# Patient Record
Sex: Female | Born: 1960
Health system: Southern US, Community
[De-identification: ages and names within clinical notes are randomized; demographics above are authoritative.]

## PROBLEM LIST (undated history)

## (undated) DIAGNOSIS — I10 Essential (primary) hypertension: Secondary | ICD-10-CM

## (undated) DIAGNOSIS — H547 Unspecified visual loss: Secondary | ICD-10-CM

## (undated) DIAGNOSIS — E785 Hyperlipidemia, unspecified: Secondary | ICD-10-CM

## (undated) DIAGNOSIS — Q909 Down syndrome, unspecified: Secondary | ICD-10-CM

## (undated) HISTORY — DX: Unspecified visual loss: H54.7

## (undated) HISTORY — DX: Down syndrome, unspecified: Q90.9

## (undated) HISTORY — DX: Hyperlipidemia, unspecified: E78.5

---

## 1998-04-11 ENCOUNTER — Encounter: Admission: RE | Admit: 1998-04-11 | Discharge: 1998-04-11 | Payer: Self-pay | Admitting: Internal Medicine

## 1999-01-22 ENCOUNTER — Encounter: Admission: RE | Admit: 1999-01-22 | Discharge: 1999-01-22 | Payer: Self-pay | Admitting: Internal Medicine

## 1999-04-13 ENCOUNTER — Inpatient Hospital Stay (HOSPITAL_COMMUNITY): Admission: EM | Admit: 1999-04-13 | Discharge: 1999-04-15 | Payer: Self-pay | Admitting: Emergency Medicine

## 1999-05-30 ENCOUNTER — Encounter: Admission: RE | Admit: 1999-05-30 | Discharge: 1999-05-30 | Payer: Self-pay | Admitting: Internal Medicine

## 1999-07-10 ENCOUNTER — Emergency Department (HOSPITAL_COMMUNITY): Admission: EM | Admit: 1999-07-10 | Discharge: 1999-07-10 | Payer: Self-pay | Admitting: Emergency Medicine

## 1999-09-02 ENCOUNTER — Emergency Department (HOSPITAL_COMMUNITY): Admission: EM | Admit: 1999-09-02 | Discharge: 1999-09-02 | Payer: Self-pay | Admitting: Emergency Medicine

## 1999-09-02 ENCOUNTER — Encounter: Payer: Self-pay | Admitting: *Deleted

## 1999-11-11 ENCOUNTER — Encounter: Admission: RE | Admit: 1999-11-11 | Discharge: 1999-11-11 | Payer: Self-pay | Admitting: Internal Medicine

## 1999-12-03 ENCOUNTER — Encounter: Admission: RE | Admit: 1999-12-03 | Discharge: 2000-03-02 | Payer: Self-pay | Admitting: Internal Medicine

## 2000-03-02 ENCOUNTER — Encounter: Admission: RE | Admit: 2000-03-02 | Discharge: 2000-05-31 | Payer: Self-pay | Admitting: Internal Medicine

## 2000-03-18 ENCOUNTER — Encounter: Admission: RE | Admit: 2000-03-18 | Discharge: 2000-03-18 | Payer: Self-pay | Admitting: Hematology and Oncology

## 2000-08-13 ENCOUNTER — Encounter: Admission: RE | Admit: 2000-08-13 | Discharge: 2000-08-13 | Payer: Self-pay | Admitting: Internal Medicine

## 2000-10-18 ENCOUNTER — Ambulatory Visit (HOSPITAL_COMMUNITY): Admission: RE | Admit: 2000-10-18 | Discharge: 2000-10-18 | Payer: Self-pay | Admitting: Internal Medicine

## 2000-10-18 ENCOUNTER — Encounter: Admission: RE | Admit: 2000-10-18 | Discharge: 2000-10-18 | Payer: Self-pay | Admitting: Internal Medicine

## 2000-11-01 ENCOUNTER — Ambulatory Visit (HOSPITAL_COMMUNITY): Admission: RE | Admit: 2000-11-01 | Discharge: 2000-11-01 | Payer: Self-pay | Admitting: Internal Medicine

## 2000-11-01 ENCOUNTER — Encounter: Admission: RE | Admit: 2000-11-01 | Discharge: 2000-11-01 | Payer: Self-pay | Admitting: Internal Medicine

## 2000-11-15 ENCOUNTER — Emergency Department (HOSPITAL_COMMUNITY): Admission: EM | Admit: 2000-11-15 | Discharge: 2000-11-15 | Payer: Self-pay | Admitting: Emergency Medicine

## 2000-12-22 ENCOUNTER — Encounter: Admission: RE | Admit: 2000-12-22 | Discharge: 2001-01-03 | Payer: Self-pay | Admitting: Surgery

## 2000-12-30 ENCOUNTER — Emergency Department (HOSPITAL_COMMUNITY): Admission: EM | Admit: 2000-12-30 | Discharge: 2000-12-30 | Payer: Self-pay | Admitting: Emergency Medicine

## 2001-03-21 ENCOUNTER — Encounter: Admission: RE | Admit: 2001-03-21 | Discharge: 2001-03-21 | Payer: Self-pay | Admitting: Internal Medicine

## 2001-04-06 ENCOUNTER — Encounter: Admission: RE | Admit: 2001-04-06 | Discharge: 2001-07-05 | Payer: Self-pay | Admitting: Internal Medicine

## 2001-07-12 ENCOUNTER — Encounter: Admission: RE | Admit: 2001-07-12 | Discharge: 2001-10-10 | Payer: Self-pay | Admitting: Orthopedic Surgery

## 2001-08-08 ENCOUNTER — Encounter: Admission: RE | Admit: 2001-08-08 | Discharge: 2001-08-08 | Payer: Self-pay | Admitting: Internal Medicine

## 2001-10-11 ENCOUNTER — Encounter: Admission: RE | Admit: 2001-10-11 | Discharge: 2002-01-09 | Payer: Self-pay | Admitting: Orthopedic Surgery

## 2001-12-15 ENCOUNTER — Encounter: Admission: RE | Admit: 2001-12-15 | Discharge: 2001-12-15 | Payer: Self-pay

## 2002-03-30 ENCOUNTER — Encounter: Admission: RE | Admit: 2002-03-30 | Discharge: 2002-03-30 | Payer: Self-pay | Admitting: Internal Medicine

## 2002-04-26 ENCOUNTER — Encounter: Payer: Self-pay | Admitting: Emergency Medicine

## 2002-04-26 ENCOUNTER — Inpatient Hospital Stay (HOSPITAL_COMMUNITY): Admission: EM | Admit: 2002-04-26 | Discharge: 2002-04-29 | Payer: Self-pay | Admitting: Emergency Medicine

## 2002-07-31 ENCOUNTER — Encounter (HOSPITAL_BASED_OUTPATIENT_CLINIC_OR_DEPARTMENT_OTHER): Admission: RE | Admit: 2002-07-31 | Discharge: 2002-10-29 | Payer: Self-pay | Admitting: Internal Medicine

## 2002-12-04 ENCOUNTER — Encounter (HOSPITAL_BASED_OUTPATIENT_CLINIC_OR_DEPARTMENT_OTHER): Admission: RE | Admit: 2002-12-04 | Discharge: 2003-03-04 | Payer: Self-pay | Admitting: Internal Medicine

## 2003-01-23 ENCOUNTER — Encounter: Admission: RE | Admit: 2003-01-23 | Discharge: 2003-01-23 | Payer: Self-pay | Admitting: Internal Medicine

## 2003-04-15 ENCOUNTER — Emergency Department (HOSPITAL_COMMUNITY): Admission: EM | Admit: 2003-04-15 | Discharge: 2003-04-15 | Payer: Self-pay | Admitting: Emergency Medicine

## 2003-05-03 ENCOUNTER — Encounter (HOSPITAL_BASED_OUTPATIENT_CLINIC_OR_DEPARTMENT_OTHER): Admission: RE | Admit: 2003-05-03 | Discharge: 2003-08-01 | Payer: Self-pay | Admitting: Internal Medicine

## 2003-10-01 ENCOUNTER — Emergency Department (HOSPITAL_COMMUNITY): Admission: EM | Admit: 2003-10-01 | Discharge: 2003-10-01 | Payer: Self-pay | Admitting: Emergency Medicine

## 2003-12-11 ENCOUNTER — Encounter: Admission: RE | Admit: 2003-12-11 | Discharge: 2003-12-11 | Payer: Self-pay | Admitting: Internal Medicine

## 2004-01-11 ENCOUNTER — Encounter: Admission: RE | Admit: 2004-01-11 | Discharge: 2004-01-11 | Payer: Self-pay | Admitting: Internal Medicine

## 2004-01-15 ENCOUNTER — Encounter: Admission: RE | Admit: 2004-01-15 | Discharge: 2004-01-15 | Payer: Self-pay | Admitting: Internal Medicine

## 2004-01-18 ENCOUNTER — Encounter: Admission: RE | Admit: 2004-01-18 | Discharge: 2004-01-18 | Payer: Self-pay | Admitting: Internal Medicine

## 2004-11-06 ENCOUNTER — Encounter (HOSPITAL_BASED_OUTPATIENT_CLINIC_OR_DEPARTMENT_OTHER): Admission: RE | Admit: 2004-11-06 | Discharge: 2005-01-21 | Payer: Self-pay | Admitting: Internal Medicine

## 2005-03-19 ENCOUNTER — Ambulatory Visit: Payer: Self-pay | Admitting: Internal Medicine

## 2005-04-23 ENCOUNTER — Ambulatory Visit: Payer: Self-pay | Admitting: Internal Medicine

## 2005-05-13 ENCOUNTER — Ambulatory Visit: Payer: Self-pay | Admitting: Internal Medicine

## 2005-11-16 HISTORY — PX: EYE SURGERY: SHX253

## 2006-02-25 ENCOUNTER — Ambulatory Visit: Payer: Self-pay

## 2006-06-10 ENCOUNTER — Ambulatory Visit: Payer: Self-pay | Admitting: Internal Medicine

## 2006-06-18 ENCOUNTER — Ambulatory Visit: Payer: Self-pay | Admitting: Hospitalist

## 2006-06-24 ENCOUNTER — Ambulatory Visit: Payer: Self-pay | Admitting: Internal Medicine

## 2006-09-25 DIAGNOSIS — I10 Essential (primary) hypertension: Secondary | ICD-10-CM | POA: Insufficient documentation

## 2006-09-25 DIAGNOSIS — Q909 Down syndrome, unspecified: Secondary | ICD-10-CM

## 2006-09-25 DIAGNOSIS — E1169 Type 2 diabetes mellitus with other specified complication: Secondary | ICD-10-CM | POA: Insufficient documentation

## 2007-01-27 ENCOUNTER — Ambulatory Visit: Payer: Self-pay | Admitting: *Deleted

## 2007-01-27 DIAGNOSIS — E785 Hyperlipidemia, unspecified: Secondary | ICD-10-CM

## 2007-01-27 DIAGNOSIS — L84 Corns and callosities: Secondary | ICD-10-CM | POA: Insufficient documentation

## 2007-01-27 LAB — CONVERTED CEMR LAB: Hgb A1c MFr Bld: 6.1 %

## 2007-02-10 ENCOUNTER — Emergency Department (HOSPITAL_COMMUNITY): Admission: EM | Admit: 2007-02-10 | Discharge: 2007-02-10 | Payer: Self-pay | Admitting: Family Medicine

## 2007-03-31 ENCOUNTER — Encounter (INDEPENDENT_AMBULATORY_CARE_PROVIDER_SITE_OTHER): Payer: Self-pay | Admitting: Internal Medicine

## 2007-04-05 ENCOUNTER — Telehealth (INDEPENDENT_AMBULATORY_CARE_PROVIDER_SITE_OTHER): Payer: Self-pay | Admitting: *Deleted

## 2007-08-26 ENCOUNTER — Emergency Department (HOSPITAL_COMMUNITY): Admission: EM | Admit: 2007-08-26 | Discharge: 2007-08-26 | Payer: Self-pay | Admitting: Emergency Medicine

## 2007-08-31 ENCOUNTER — Ambulatory Visit: Payer: Self-pay | Admitting: Internal Medicine

## 2007-09-01 ENCOUNTER — Encounter (INDEPENDENT_AMBULATORY_CARE_PROVIDER_SITE_OTHER): Payer: Self-pay | Admitting: Internal Medicine

## 2007-09-07 ENCOUNTER — Ambulatory Visit: Payer: Self-pay | Admitting: Infectious Diseases

## 2007-09-07 ENCOUNTER — Encounter (INDEPENDENT_AMBULATORY_CARE_PROVIDER_SITE_OTHER): Payer: Self-pay | Admitting: Internal Medicine

## 2007-09-07 ENCOUNTER — Telehealth: Payer: Self-pay | Admitting: *Deleted

## 2007-09-09 LAB — CONVERTED CEMR LAB
ALT: 28 units/L (ref 0–35)
Albumin: 3.6 g/dL (ref 3.5–5.2)
Basophils Absolute: 0.1 10*3/uL (ref 0.0–0.1)
Basophils Relative: 1 % (ref 0–1)
Calcium: 8.8 mg/dL (ref 8.4–10.5)
Creatinine, Ser: 1.02 mg/dL (ref 0.40–1.20)
Eosinophils Relative: 1 % (ref 0–5)
HCT: 38.5 % (ref 36.0–46.0)
LDL Cholesterol: 99 mg/dL (ref 0–99)
Lymphs Abs: 4 10*3/uL — ABNORMAL HIGH (ref 0.7–3.3)
MCHC: 30.9 g/dL (ref 30.0–36.0)
MCV: 90.6 fL (ref 78.0–100.0)
Neutro Abs: 4.8 10*3/uL (ref 1.7–7.7)
Neutrophils Relative %: 49 % (ref 43–77)
Platelets: 272 10*3/uL (ref 150–400)
RBC: 4.25 M/uL (ref 3.87–5.11)
Sodium: 138 meq/L (ref 135–145)
Total CHOL/HDL Ratio: 3.3
Total Protein: 7.8 g/dL (ref 6.0–8.3)
VLDL: 12 mg/dL (ref 0–40)

## 2007-09-22 ENCOUNTER — Encounter (INDEPENDENT_AMBULATORY_CARE_PROVIDER_SITE_OTHER): Payer: Self-pay | Admitting: Internal Medicine

## 2008-04-11 ENCOUNTER — Encounter: Payer: Self-pay | Admitting: Internal Medicine

## 2008-05-08 ENCOUNTER — Encounter (INDEPENDENT_AMBULATORY_CARE_PROVIDER_SITE_OTHER): Payer: Self-pay | Admitting: Internal Medicine

## 2008-06-27 ENCOUNTER — Ambulatory Visit: Payer: Self-pay | Admitting: Infectious Diseases

## 2008-06-27 LAB — CONVERTED CEMR LAB
Blood Glucose, Fingerstick: 135
Hgb A1c MFr Bld: 6.3 %

## 2008-07-06 ENCOUNTER — Encounter: Payer: Self-pay | Admitting: Internal Medicine

## 2008-11-12 ENCOUNTER — Emergency Department (HOSPITAL_COMMUNITY): Admission: EM | Admit: 2008-11-12 | Discharge: 2008-11-12 | Payer: Self-pay | Admitting: Family Medicine

## 2008-11-12 ENCOUNTER — Telehealth: Payer: Self-pay | Admitting: Infectious Diseases

## 2008-12-13 ENCOUNTER — Ambulatory Visit: Payer: Self-pay | Admitting: Internal Medicine

## 2008-12-13 ENCOUNTER — Encounter: Payer: Self-pay | Admitting: Internal Medicine

## 2008-12-13 ENCOUNTER — Telehealth: Payer: Self-pay | Admitting: Internal Medicine

## 2008-12-13 LAB — CONVERTED CEMR LAB
ALT: 28 units/L (ref 0–35)
AST: 25 units/L (ref 0–37)
BUN: 29 mg/dL — ABNORMAL HIGH (ref 6–23)
Calcium: 9.1 mg/dL (ref 8.4–10.5)
Chloride: 103 meq/L (ref 96–112)
Cholesterol: 195 mg/dL (ref 0–200)
Glucose, Bld: 136 mg/dL — ABNORMAL HIGH (ref 70–99)
HCT: 42.1 % (ref 36.0–46.0)
HDL: 74 mg/dL (ref >39)
Hemoglobin: 13.2 g/dL (ref 12.0–15.0)
MCV: 90.1 fL (ref 78.0–100.0)
Microalb Creat Ratio: 2.8 mg/g (ref 0.0–30.0)
Monocytes Relative: 8 % (ref 3–12)
Neutrophils Relative %: 45 % (ref 43–77)
Platelets: 222 10*3/uL (ref 150–400)
RBC: 4.67 M/uL (ref 3.87–5.11)
Total Bilirubin: 0.4 mg/dL (ref 0.3–1.2)
Total Protein: 7.4 g/dL (ref 6.0–8.3)
Triglycerides: 87 mg/dL (ref <150)
WBC: 6.8 10*3/uL (ref 4.0–10.5)

## 2008-12-24 ENCOUNTER — Ambulatory Visit: Payer: Self-pay | Admitting: Internal Medicine

## 2008-12-24 LAB — CONVERTED CEMR LAB: Blood Glucose, Fingerstick: 167

## 2009-02-08 ENCOUNTER — Telehealth (INDEPENDENT_AMBULATORY_CARE_PROVIDER_SITE_OTHER): Payer: Self-pay | Admitting: *Deleted

## 2009-03-12 ENCOUNTER — Telehealth: Payer: Self-pay | Admitting: Internal Medicine

## 2009-03-18 ENCOUNTER — Encounter: Payer: Self-pay | Admitting: Internal Medicine

## 2009-04-05 ENCOUNTER — Encounter: Payer: Self-pay | Admitting: Internal Medicine

## 2009-04-12 ENCOUNTER — Ambulatory Visit: Payer: Self-pay | Admitting: *Deleted

## 2009-04-12 ENCOUNTER — Encounter: Payer: Self-pay | Admitting: Internal Medicine

## 2009-04-12 LAB — CONVERTED CEMR LAB: Blood Glucose, Fingerstick: 125

## 2009-04-16 ENCOUNTER — Ambulatory Visit: Payer: Self-pay | Admitting: Internal Medicine

## 2009-05-01 ENCOUNTER — Encounter: Payer: Self-pay | Admitting: Internal Medicine

## 2009-05-15 ENCOUNTER — Encounter: Payer: Self-pay | Admitting: Internal Medicine

## 2009-06-11 ENCOUNTER — Ambulatory Visit: Payer: Self-pay | Admitting: Internal Medicine

## 2009-07-10 ENCOUNTER — Telehealth: Payer: Self-pay | Admitting: Internal Medicine

## 2010-03-11 ENCOUNTER — Telehealth: Payer: Self-pay | Admitting: Internal Medicine

## 2010-03-17 ENCOUNTER — Ambulatory Visit: Payer: Self-pay | Admitting: Internal Medicine

## 2010-03-17 LAB — CONVERTED CEMR LAB: Hgb A1c MFr Bld: 6.3 %

## 2010-05-13 ENCOUNTER — Encounter: Payer: Self-pay | Admitting: Internal Medicine

## 2010-08-06 ENCOUNTER — Ambulatory Visit: Payer: Self-pay | Admitting: Internal Medicine

## 2010-08-06 DIAGNOSIS — M109 Gout, unspecified: Secondary | ICD-10-CM | POA: Insufficient documentation

## 2010-08-06 DIAGNOSIS — B86 Scabies: Secondary | ICD-10-CM

## 2010-08-06 LAB — CONVERTED CEMR LAB: Blood Glucose, Fingerstick: 95

## 2010-08-28 ENCOUNTER — Encounter: Admission: RE | Admit: 2010-08-28 | Discharge: 2010-09-24 | Payer: Self-pay | Admitting: Podiatry

## 2010-10-27 ENCOUNTER — Ambulatory Visit: Payer: Self-pay | Admitting: Internal Medicine

## 2010-10-27 ENCOUNTER — Encounter: Payer: Self-pay | Admitting: Internal Medicine

## 2010-10-27 LAB — CONVERTED CEMR LAB
ALT: 14 units/L (ref 0–35)
Creatinine, Ser: 1.01 mg/dL (ref 0.40–1.20)
Creatinine, Urine: 123 mg/dL
HDL: 53 mg/dL (ref >39)
Hgb A1c MFr Bld: 6.4 %
Indirect Bilirubin: 0.5 mg/dL (ref 0.0–0.9)
Microalb, Ur: 0.75 mg/dL (ref 0.00–1.89)
Potassium: 4.2 meq/L (ref 3.5–5.3)
Sodium: 140 meq/L (ref 135–145)
Triglycerides: 75 mg/dL (ref <150)
VLDL: 15 mg/dL (ref 0–40)

## 2010-12-07 ENCOUNTER — Encounter: Payer: Self-pay | Admitting: Infectious Diseases

## 2010-12-08 ENCOUNTER — Encounter: Payer: Self-pay | Admitting: Internal Medicine

## 2010-12-18 NOTE — Letter (Signed)
Summary: [employer]  [employer]   Imported By: Margie Billet 07/17/2010 09:56:55  _____________________________________________________________________  External Attachment:    Type:   Image     Comment:   External Document

## 2010-12-18 NOTE — Letter (Signed)
Summary: [employer]  [employer]   Imported By: Shon Hough 10/30/2010 15:34:25  _____________________________________________________________________  External Attachment:    Type:   Image     Comment:   External Document

## 2010-12-18 NOTE — Progress Notes (Signed)
Summary: refill/gg  Phone Note Refill Request  on March 11, 2010 9:45 AM  Refills Requested: Medication #1:  HYDROCHLOROTHIAZIDE 25 MG  TABS Take 1 tablet by mouth once a day   Last Refilled: 01/17/2010  Method Requested: Electronic Initial call taken by: Merrie Roof RN,  March 11, 2010 9:45 AM  Follow-up for Phone Call        Refill approved-nurse to complete Follow-up by: Julaine Fusi  DO,  March 11, 2010 2:13 PM    Prescriptions: HYDROCHLOROTHIAZIDE 25 MG  TABS (HYDROCHLOROTHIAZIDE) Take 1 tablet by mouth once a day  #30 Tablet x 9   Entered and Authorized by:   Julaine Fusi  DO   Signed by:   Julaine Fusi  DO on 03/11/2010   Method used:   [facility]        [facility] [phone]* (retail)       [address]       [place], [place]  [address]       Ph: [phone] or [phone]       Fax: [phone]   RxID:   [id number]

## 2010-12-18 NOTE — Assessment & Plan Note (Signed)
Summary: acute-diabetes check and requesting diabetic shoes unable to ...   Vital Signs:  Patient profile:   50 year old female Height:      48.9 inches (124.21 cm) Weight:      158.6 pounds (72.09 kg) BMI:     46.80 Temp:     97.0 degrees F oral Pulse rate:   58 / minute BP sitting:   117 / 71  (right arm) Cuff size:   regular  Vitals Entered By: Chinita Pester RN (October 27, 2010 11:00 AM) CC: Check-up. Needs diabetic shoes. Is Patient Diabetic? Yes Did you bring your meter with you today? No Pain Assessment Patient in pain? yes     Location: left foot Intensity: unable Type: "gout" pain Onset of pain  Intermittent Nutritional Status BMI of > 30 = obese CBG Result 184  Have you ever been in a relationship where you felt threatened, hurt or afraid?Unable to ask; someone w/pt.   Does patient need assistance? Functional Status Self care, Cook/clean, Shopping, Social activities Ambulation Impaired:Risk for fall Comments Nurse w/pt.     Legally blind.   Primary Care Provider:  Julaine Fusi  DO  CC:  Check-up. Needs diabetic shoes..  History of Present Illness: DM. patient requests a RX for diabetic shoes. HTN. Takes her meds as prescribed. No side-effects. HLD. Need FLP. Gout. Feels worse. Left foot gout attacks 1-2xmonth. Diet high in red meets. Scabies. Used perimeth lotion as Rx-ed.Denies any itching.  Depression History:      The patient denies a depressed mood most of the day and a diminished interest in her usual daily activities.          Diabetic Foot Exam Foot Inspection Is there a history of a foot ulcer?              No Is there a foot ulcer now?              No Is there swelling or an abnormal foot shape?          No Are the toenails long?                No Are the toenails thick?                No Are the toenails ingrown?              No Is there heavy callous build-up?              No Is there pain in the calf muscle (Intermittent  claudication) when walking?    NoIs there a claw toe deformity?              No Is there elevated skin temperature?            No Is there limited ankle dorsiflexion?            No Is there foot or ankle muscle weakness?            No  Diabetic Foot Care Education Patient educated on appropriate care of diabetic feet.  Pulse Check          Right Foot          Left Foot Posterior Tibial:        normal            normal Dorsalis Pedis:        normal            normal  High Risk Feet? No   10-g (5.07) Semmes-Weinstein Monofilament Test           Right Foot          Left Foot Visual Inspection               Test Control      normal         normal Site 1         normal         normal Site 2         normal         normal Site 3         normal         normal Site 4         normal         normal Site 5         normal         normal Site 6         normal         normal Site 7         normal         normal Site 8         normal         normal Site 9         normal         normal Site 10         normal         normal  Impression      normal         normal  Preventive Screening-Counseling & Management  Alcohol-Tobacco     Smoking Status: never  Caffeine-Diet-Exercise     Does Patient Exercise: no  Problems Prior to Update: 1)  Diabetes Mellitus, Type II  (ICD-250.00) 2)  Hypertension  (ICD-401.9) 3)  Neck Pain  (ICD-723.1) 4)  Foot Pain, Right  (ICD-729.5) 5)  Foot Pain, Left  (ICD-729.5) 6)  Preventive Health Care  (ICD-V70.0) 7)  Hyperlipidemia  (ICD-272.4) 8)  Glaucoma, Left Eye  (ICD-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 Inject 20 Units Subcutaneously Every Morning and 10 Units At Night 2)  Hydrochlorothiazide 25 Mg  Tabs (Hydrochlorothiazide) .... Take 1 Tablet By Mouth Once A Day 3)  Freestyle Lite   Strp (Glucose Blood) .... To Test Blood Sugar Four Times A Day Before Meals and Bedtime 4)  Lancets   Misc (Lancets) .... To Test Blood Sugar Four Times A Day 5)  Bd Insulin Syringe 31g X 5/16" 0.3 Ml Misc (Insulin Syringe-Needle U-100) .... Use To Inject Insulin Twice Daily 6)  Freestyle Lite  Devi (Blood Glucose Monitoring Suppl) .Marland Kitchen.. 1 Meter Dx: 250.0 Use As Directed 7)  Voltaren 1 % Gel (Diclofenac Sodium) .... Apply Topically To Back of Neck and Shoulders Two Times A Day As Directed 8)  Colcrys 0.6 Mg Tabs (Colchicine) .... Take Two Tablets By  Mouth Now, Then One Tablet By Mouth Q 6 Hours As Needed For Gout 9)  Allopurinol 100 Mg Tabs (Allopurinol) .... Take One Tablet Daily. Do Not Start This Medication Until Your Current Gout Flare Is Resolved.  Allergies (verified): No Known Drug Allergies  Past History:  Past medical, surgical, family and social histories (including risk factors) reviewed, and no changes noted (except as noted below).  Past Medical History: Reviewed history from 06/27/2008 and no changes required. Diabetes mellitus, type II Hypertension Down syndrome GLAUCOMA    - s/p OS nucleation      Family History: Reviewed history from 12/13/2008 and no changes required. DM, CAD  Social History: Reviewed history from 06/11/2009 and no changes required. Caretaker: Kennith Maes. Mental retardation- severe Pt not sexually active. No ETOH No Tobacco.  Physical Exam  General:  Well-developed,well-nourished,in no acute distress; alert,appropriate and cooperative throughout examination Eyes:  vision impaired R, vision impaired L aphakic,  and bilateral cataracts.   Nose:  no external deformity.   Mouth:  poor dentition.   Neck:  supple and no masses.   Lungs:  Normal respiratory effort, chest expands symmetrically. Lungs are clear to auscultation, no crackles or wheezes. Heart:  Normal rate and regular rhythm. S1 and S2 normal without gallop, murmur, click, rub or other extra sounds. Abdomen:  Bowel sounds positive,abdomen soft and non-tender without masses, organomegaly or hernias noted. Msk:  joint tenderness, joint swelling, and joint warmth.   Pulses:  R and L carotid,radial,dorsalis pedis and posterior tibial pulses are full and equal bilaterally Extremities:  No clubbing, cyanosis, edema. But severe calusus on the plantar service.   Skin:  pruritic eruption on the back. although  h/o of contact to scabies: no characteristic lesions and distribution like between fingers , wrist,etc.  There are some suspicious lesion like bumps on the back which has been present since childhood. Psych:  Pt can interact and carry a limited basic conversation concerning her symptoms.  Diabetes Management Exam:    Foot Exam (with socks and/or shoes not present):       Sensory-Pinprick/Light touch:          Left medial foot (L-4): normal          Left dorsal foot (L-5): normal          Left lateral foot (S-1): normal          Right medial foot (L-4): normal          Right dorsal foot (L-5): normal          Right lateral foot (S-1): normal       Sensory-Monofilament:          Left foot: normal          Right foot: normal       Inspection:          Left foot: normal          Right foot: normal       Nails:          Left foot: normal          Right foot: normal    Foot Exam by Podiatrist:       Date: 10/27/2010       Results: no diabetic findings       Done by: Denton Meek   Impression & Recommendations:  Problem # 1:  DIABETES MELLITUS, TYPE II (ICD-250.00) Assessment Unchanged  Rx for Diabetic shoes sig# 1 pain with 1 RF given  to the patient per her request.diet, exercise, foot care discussed with the patient. Her updated medication list for this problem includes:    Humulin 70/30 70-30 % Susp (Insulin isophane & reg (human)) ..... Inject 20 units subcutaneously every morning and 10 units at night  Orders: T-Urine Microalbumin w/creat. ratio [phone]) T- Capillary Blood Glucose (82948) T-Hgb A1C (in-house) (62130QM) Ophthalmology Referral (Ophthalmology)  Labs Reviewed: Creat: 0.97 (12/13/2008)     Last Eye Exam: Glaucoma.   Intraocular pressure OD:  elevated    Intraocular pressure OS:  elevated  (01/22/2009) Reviewed HgBA1c results: 6.6 (08/06/2010)  6.3 (03/17/2010)  Labs Reviewed: Creat: 0.97 (12/13/2008)     Last Eye Exam: Glaucoma.   Intraocular pressure OD:  elevated    Intraocular pressure OS:  elevated  (01/22/2009) Reviewed HgBA1c results: 6.4 (10/27/2010)  6.6 (08/06/2010)  Problem # 2:  HYPERTENSION (ICD-401.9) Assessment: Comment Only  At goal. No change in regimen. Her updated medication list for this problem includes:    Hydrochlorothiazide 25 Mg Tabs (Hydrochlorothiazide) .Marland Kitchen... Take 1 tablet by mouth once a day  Orders: T-Basic Metabolic Panel [id number]) Ophthalmology Referral (Ophthalmology)  Prior BP: 111/72 (08/06/2010)  Labs Reviewed: K+: 4.4 (12/13/2008) Creat: : 0.97 (12/13/2008)   Chol: 195 (12/13/2008)   HDL: 74 (12/13/2008)   LDL: 104 (12/13/2008)   TG: 87 (12/13/2008)  Her updated medication list for this problem includes:    Hydrochlorothiazide 25 Mg Tabs (Hydrochlorothiazide) .Marland Kitchen... Take 1 tablet by mouth once a day  Orders: T-Basic Metabolic Panel [id number]) Ophthalmology Referral (Ophthalmology)  BP today: 117/71 Prior BP: 111/72 (08/06/2010)  Labs Reviewed: K+: 4.4 (12/13/2008) Creat: : 0.97 (12/13/2008)   Chol: 195 (12/13/2008)   HDL: 74 (12/13/2008)   LDL: 104 (12/13/2008)   TG: 87 (12/13/2008)  Problem # 3:  HYPERLIPIDEMIA  (ICD-272.4) Assessment: Comment Only Low cholesterol and high fiber diet discussed with the patient. Orders: T-Lipid Profile [id number])  Labs Reviewed: SGOT: 25 (12/13/2008)   SGPT: 28 (12/13/2008)   HDL:74 (12/13/2008), 48 (09/07/2007)  LDL:104 (12/13/2008), 99 (03/47/4259)  Chol:195 (12/13/2008), 159 (09/07/2007)  Trig:87 (12/13/2008), 59 (09/07/2007)  Orders: T-Lipid Profile ([id number])  Problem # 4:  GOUT, ACUTE (ICD-274.01) Assessment: Deteriorated Patient c/o recurrent gout flares 1-2 times/month. Diet high in hamburgers and beef. No fish or broths. will satrt on colcrys. Once gout flare is resolved->will start allopurinol therapy.  Echart and Centricity records reviewed --no joint fluid studies were found. Consider a joint aspiration at some point for a definitive Dx of Gout. Elevate extremity; warm compresses, symptomatic relief and medication as directed.   Her updated medication list for this problem includes:    Colcrys 0.6 Mg Tabs (Colchicine) .Marland Kitchen... Take two tablets by mouth now, then one tablet by mouth q 6 hours as needed for gout    Allopurinol 100 Mg Tabs (Allopurinol) .Marland Kitchen... Take one tablet daily. do not start this medication until your current gout flare is resolved.  Complete Medication List: 1)  Humulin 70/30 70-30 % Susp (Insulin isophane & reg (human)) .... Inject 20 units subcutaneously every morning and 10 units at night 2)  Hydrochlorothiazide 25 Mg Tabs (Hydrochlorothiazide) .... Take 1 tablet by mouth once a day 3)  Freestyle Lite Strp (Glucose blood) .... To test blood sugar four times a day before meals and bedtime 4)  Lancets Misc (Lancets) .... To test blood sugar four times a day 5)  Bd Insulin Syringe 31g X 5/16" 0.3 Ml Misc (Insulin syringe-needle u-100) .... Use to inject insulin twice daily 6)  Freestyle Lite Devi (Blood glucose monitoring suppl) .Marland Kitchen.. 1 meter dx: 250.0 use as directed 7)  Voltaren 1 % Gel (Diclofenac sodium) .... Apply topically to  back of neck and shoulders two times a day as directed 8)  Colcrys 0.6 Mg Tabs (Colchicine) .... Take two tablets by mouth now, then one tablet by mouth q 6 hours as needed for gout 9)  Allopurinol 100 Mg Tabs (Allopurinol) .... Take one tablet daily. do not start this medication until your current gout flare is resolved.  Other Orders: T-Hepatic Function [id number]) Tdap => 19yrs IM (95188) Admin 1st Vaccine (41660)  Patient Instructions: 1)  Please, take all your medications as prescribed. 2)  [facility] 3)  Please, call with any questions. 4)  Please, follow up in 3-4 months or sooner Prescriptions: ALLOPURINOL 100 MG TABS (ALLOPURINOL) Take one tablet daily. Do not start this medication until your current gout flare is resolved.  #30 x 11   Entered and Authorized by:   Deatra Robinson MD   Signed by:   Deatra Robinson MD on 10/27/2010   Method used:   [facility]        [facility] [phone][facility] (retail)       [address], [place]  [address]       Ph: [phone] or [phone]       Fax: [phone]   RxID:   [id number] COLCRYS 0.6 MG TABS (COLCHICINE) take two tablets by mouth now, then one tablet by mouth q 6 hours as needed for gout  #30 x 5   Entered and Authorized by:   Deatra Robinson MD   Signed by:   Deatra Robinson MD on 10/27/2010   Method used:   [facility]        [facility] [phone]* (retail)       [address], [place]  [address]       Ph: [phone] or [phone]       Fax: [phone]   RxID:   [id number] BD INSULIN SYRINGE 31G X 5/16" 0.3 ML MISC (INSULIN SYRINGE-NEEDLE U-100) use to inject insulin twice daily Brand medically necessary #100 x 7   Entered and Authorized by:   Deatra Robinson MD   Signed by:   Deatra Robinson MD on 10/27/2010   Method used:   [facility]        [facility] [phone]* (retail)       [address] [place]  [address]       Ph: [phone] or [phone]       Fax: [phone]   RxID:   [id number] HYDROCHLOROTHIAZIDE 25 MG  TABS (HYDROCHLOROTHIAZIDE) Take 1 tablet by mouth once a day  #30 Tablet x 9   Entered and Authorized by:   Deatra Robinson MD   Signed by:   Deatra Robinson MD on 10/27/2010   Method used:   [facility] to        [facility] [phone]* (retail)       [address], [place]  [address]       Ph: [phone] or [phone]       Fax: [phone]   RxID:   [id number]    Orders Added: 1)  T-Urine  Microalbumin w/creat. ratio [[id number]] 2)  T-Lipid Profile [80061-22930] 3)  T-Hepatic Function [80076-22960] 4)  T-Basic Metabolic Panel [80048-22910] 5)  Est. Patient Level III [57846] 6)  T- Capillary Blood Glucose [82948] 7)  T-Hgb A1C (in-house) [83036QW] 8)  Est. Patient Level III [96295] 9)  Tdap => 76yrs IM [[address]] 10)  Admin 1st Vaccine [28413] 11)  Ophthalmology Referral [Ophthalmology]   Immunizations Administered:  Tetanus Vaccine:    Vaccine Type: Tdap    Site: right deltoid    Mfr: GlaxoSmithKline    Dose: 0.5 ml    Route: IM    Given by: Chinita Pester RN    Exp. Date: 09/04/2012    Lot #: [id number]    VIS given: 10/03/08 version given October 27, 2010.   Immunizations Administered:  Tetanus Vaccine:    Vaccine Type: Tdap    Site: right deltoid    Mfr: GlaxoSmithKline    Dose: 0.5 ml    Route: IM    Given by: Chinita Pester RN    Exp. Date: 09/04/2012    Lot #: [id number]    VIS given: 10/03/08 version given October 27, 2010.  Prevention & Chronic Care Immunizations   Influenza vaccine: Fluvax MCR  (08/31/2007)   Influenza vaccine due: 07/18/2011    Tetanus booster: 10/27/2010: Tdap    Pneumococcal vaccine: Not documented   Pneumococcal vaccine due: 10/28/2015  Other Screening   Pap smear: Not documented   Pap smear action/deferral: Not indicated S/P hysterectomy  (06/11/2009)     Mammogram: Not documented   Mammogram action/deferral: Deferred to age 26  (03/17/2010)   Smoking status: never  (10/27/2010)  Diabetes Mellitus   HgbA1C: 6.4  (10/27/2010)   Hemoglobin A1C due: 08/07/2011    Eye exam: Glaucoma.   Intraocular pressure OD:  elevated    Intraocular pressure OS:  elevated   (01/22/2009)   Diabetic eye exam action/deferral: Ophthalmology referral  (10/27/2010)   Eye exam due: 01/22/2010    Foot exam: yes  (10/27/2010)   Foot exam action/deferral: Do today   High risk foot: No  (10/27/2010)   Foot care education: Done  (10/27/2010)    Urine microalbumin/creatinine ratio: 2.8  (12/13/2008)   Urine microalbumin action/deferral: Ordered   Urine microalbumin/cr due: 10/28/2011    Diabetes flowsheet reviewed?: Yes   Progress toward A1C goal: Unchanged    Stage of readiness to change (diabetes management): Maintenance  Lipids   Total Cholesterol: 195  (12/13/2008)   Lipid panel action/deferral: Lipid Panel ordered   LDL: 104  (12/13/2008)   LDL Direct: Not documented   HDL: 74  (12/13/2008)   Triglycerides: 87  (12/13/2008)   Lipid panel due: 10/28/2011    SGOT (AST): 25  (12/13/2008)   BMP action: Ordered   SGPT (ALT): 28  (12/13/2008)   Alkaline phosphatase: 70  (12/13/2008)   Total bilirubin: 0.4  (12/13/2008)   Liver panel due: 10/28/2011    Lipid flowsheet reviewed?: Yes   Progress toward LDL goal: Unchanged    Stage of readiness to change (lipid management): Maintenance  Hypertension   Last Blood Pressure: 117 / 71  (10/27/2010)   Serum creatinine: 0.97  (12/13/2008)   BMP action: Ordered   Serum potassium 4.4  (12/13/2008)   Basic metabolic panel due: 10/28/2011    Hypertension flowsheet reviewed?: Yes   Progress toward BP goal: At goal    Stage of readiness to change (hypertension management): Maintenance  Self-Management Support :   Personal Goals (by the next  clinic visit) :     Personal A1C goal: 7  (10/27/2010)      Personal blood pressure goal: 130/80  (10/27/2010)     Personal LDL goal: 100  (10/27/2010)    Patient will work on the following items until the next clinic visit to reach self-care goals:     Medications and monitoring: take my medicines every day, bring all of my medications to every visit, examine my feet every day  (10/27/2010)     Eating: drink diet soda or water instead of juice or soda, eat more vegetables, use fresh or frozen vegetables, eat foods that are low in salt, eat baked foods instead of fried foods, eat fruit for snacks and desserts  (10/27/2010)     Activity: take a 30 minute walk every day  (10/27/2010)    Diabetes self-management support: Written self-care plan  (10/27/2010)   Diabetes care plan printed   Last diabetes self-management training by diabetes educator: 04/16/2009   Last medical nutrition therapy: 12/24/2008    Hypertension self-management support: Written self-care plan  (10/27/2010)   Hypertension self-care plan printed.    Lipid self-management support: Written self-care plan  (10/27/2010)   Lipid self-care plan printed.   Nursing Instructions: Give Flu vaccine today Give tetanus booster today Give Pneumovax today Refer for screening diabetic eye exam (see order) Diabetic foot exam today  The pt.'s nurse called the Guardian who refused flu/pneumonia vaccine.   Chinita Pester RN  October 27, 2010 11:43 AM   Process Orders Check Orders Results:     [facility]: Check successful Tests Sent for requisitioning (October 27, 2010 12:01 PM):     10/27/2010: [facility] -- T-Urine Microalbumin w/creat. ratio [[id number]] (signed)     10/27/2010: [facility] -- T-Lipid Profile [phone] (signed)     10/27/2010: [facility] -- T-Hepatic Function [phone] (signed)     10/27/2010: [facility] -- T-Basic Metabolic Panel [phone] (signed)    Laboratory  Results   Blood Tests   Date/Time Received: October 27, 2010 11:23 AM Date/Time Reported: Burke Keels  October 27, 2010 11:23 AM   HGBA1C: 6.4%   (Normal Range: Non-Diabetic - 3-6%   Control Diabetic - 6-8%) CBG Random:: 184mg /dL

## 2010-12-18 NOTE — Assessment & Plan Note (Signed)
Summary: EST-CK/FU/MEDS/CFB   Vital Signs:  Patient profile:   50 year old female Height:      48.9 inches (124.21 cm) Weight:      172 pounds (78.18 kg) BMI:     50.76 O2 Sat:      98 % on Room air Temp:     97.3 degrees F (36.28 degrees C) oral Pulse rate:   94 / minute BP sitting:   99 / 68  (left arm) Cuff size:   regular  Vitals Entered By: Angelina Ok RN (Mar 17, 2010 10:14 AM)  O2 Flow:  Room air Is Patient Diabetic? Yes Did you bring your meter with you today? No Pain Assessment Patient in pain? no      Nutritional Status BMI of > 30 = obese CBG Result 147  Have you ever been in a relationship where you felt threatened, hurt or afraid?No   Does patient need assistance? Functional Status Self care, Cook/clean, Shopping, Social activities Ambulation Normal Comments Needs assistance with most ADL's.  Non productive cough.  No fevers or  chills.  Check up today  Needs refills on meds.  Cholesterol pills have been discontinued.  Needs Needles and Lancets and strips.    Primary Care Provider:  Julaine Fusi  DO   History of Present Illness: Arla is in today for routine follow-up on her Diabetes. She again returns with the same caregiver who knows very little about her medictaions or BG monitoring. No meter. Her A1C was very good at her last visit- 6.8. No know lows. No complaints today.  Depression History:      The patient denies a depressed mood most of the day and a diminished interest in her usual daily activities.  The patient denies significant weight loss, significant weight gain, insomnia, hypersomnia, psychomotor agitation, psychomotor retardation, fatigue (loss of energy), feelings of worthlessness (guilt), impaired concentration (indecisiveness), and recurrent thoughts of death or suicide.        The patient denies that she feels like life is not worth living, denies that she wishes that she were dead, and denies that she has thought about ending her life.          Preventive Screening-Counseling & Management  Alcohol-Tobacco     Smoking Status: never  Current Medications (verified): 1)  Humulin 70/30 70-30 % Susp (Insulin Isophane & Reg (Human)) .... Inject 25 Units Subcutaneously Every Morning and 15 Units At Night 2)  Hydrochlorothiazide 25 Mg  Tabs (Hydrochlorothiazide) .... Take 1 Tablet By Mouth Once A Day 3)  Freestyle Lite   Strp (Glucose Blood) .... To Test Blood Sugar Four Times A Day Before Meals and Bedtime 4)  Lancets   Misc (Lancets) .... To Test Blood Sugar Four Times A Day 5)  Lipitor 20 Mg  Tabs (Atorvastatin Calcium) .... Take 1 Tablet By Mouth Once A Day 6)  Bd Insulin Syringe 31g X 5/16" 0.3 Ml Misc (Insulin Syringe-Needle U-100) .... Use To Inject Insulin Twice Daily 7)  Freestyle Lite  Devi (Blood Glucose Monitoring Suppl) .Marland Kitchen.. 1 Meter Dx: 250.0 Use As Directed 8)  Voltaren 1 % Gel (Diclofenac Sodium) .... 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 Eyes:  vision impaired R, vision impaired L aphakic, and bilateral cataracts.  Ears:  no external deformities.   Nose:  no external deformity.   Mouth:  poor dentition.   Neck:  supple and no masses.   Lungs:  normal respiratory effort and normal breath sounds.   Heart:  normal rate, regular rhythm, and no murmur.   Abdomen:  soft and non-tender.   Msk:  normal ROM and no joint tenderness.   Skin:  no rashes.   Psych:  Pt can interact and carry a limited basic conversation concerning her symptoms.   Impression & Recommendations:  Problem # 1:  HYPERTENSION (ICD-401.9) Well controlled. No changes to medication.  Her updated medication list for this problem includes:    Hydrochlorothiazide 25 Mg Tabs (Hydrochlorothiazide) .Marland Kitchen... Take 1 tablet by mouth once a day  BP today:  99/68 Prior BP: 113/72 (06/11/2009)  Labs Reviewed: K+: 4.4 (12/13/2008) Creat: : 0.97 (12/13/2008)   Chol: 195 (12/13/2008)   HDL: 74 (12/13/2008)   LDL: 104 (12/13/2008)   TG: 87 (12/13/2008)  Problem # 2:  HYPERLIPIDEMIA (ICD-272.4) Due for lipid panel today.  Her updated medication list for this problem includes:    Lipitor 20 Mg Tabs (Atorvastatin calcium) .Marland Kitchen... Take 1 tablet by mouth once a day  Orders: T-Lipid Profile [id number])  Labs Reviewed: SGOT: 25 (12/13/2008)   SGPT: 28 (12/13/2008)   HDL:74 (12/13/2008), 48 (09/07/2007)  LDL:104 (12/13/2008), 99 (09/81/1914)  Chol:195 (12/13/2008), 159 (09/07/2007)  Trig:87 (12/13/2008), 59 (09/07/2007)  Problem # 3:  NECK PAIN (ICD-723.1) resolved.  Problem # 4:  DIABETES MELLITUS, TYPE II (ICD-250.00) Excellent control. Has maintained normal A1C on consistent dose of 70/30. No changes today. Refilled all meds. Her updated medication list for this problem includes:    Humulin 70/30 70-30 % Susp (Insulin isophane & reg (human)) ..... Inject 25 units subcutaneously every morning and 15 units at night  Orders: T- Capillary Blood Glucose ([id number]) T-Hgb A1C (in-house) (62130QM) T-CBC w/Diff ([id number])  Labs Reviewed: Creat: 0.97 (12/13/2008)     Last Eye Exam: Glaucoma.   Intraocular pressure OD:  elevated    Intraocular pressure OS:  elevated  (01/22/2009) Reviewed HgBA1c results: 6.3 (03/17/2010)  6.3 (04/12/2009)  Complete Medication List: 1)  Humulin 70/30 70-30 % Susp (Insulin isophane & reg (human)) .... Inject 25 units subcutaneously every morning and 15 units at night 2)  Hydrochlorothiazide 25 Mg Tabs (Hydrochlorothiazide) .... Take 1 tablet by mouth once a day 3)  Freestyle Lite Strp (Glucose blood) .... To test blood sugar four times a day before meals and bedtime 4)  Lancets Misc (Lancets) .... To test blood sugar four times a day 5)  Lipitor 20 Mg Tabs (Atorvastatin calcium) .... Take 1 tablet by mouth once  a day 6)  Bd Insulin Syringe 31g X 5/16" 0.3 Ml Misc (Insulin syringe-needle u-100) .... Use to inject insulin twice daily 7)  Freestyle Lite Devi (Blood glucose monitoring suppl) .Marland Kitchen.. 1 meter dx: 250.0 use as directed 8)  Voltaren 1 % Gel (Diclofenac sodium) .... Apply topically to back of neck and shoulders two times a day as directed  Other Orders: T-Comprehensive Metabolic Panel ([id number])  Patient Instructions: 1)  Please schedule a follow-up appointment in 6 months. Prescriptions: HUMULIN 70/30 70-30 % SUSP (INSULIN ISOPHANE & REG (HUMAN)) Inject 25 units subcutaneously every morning and 15 units at night  #5 x 11   Entered and Authorized by:   Julaine Fusi  DO   Signed by:   Julaine Fusi  DO on 03/17/2010   Method used:  [facility]        [facility] [phone]* (retail)       [address], [place]  [address]       Ph: [phone] or [phone]       Fax: [phone]   RxID:   [id number] BD INSULIN SYRINGE 31G X 5/16" 0.3 ML MISC (INSULIN SYRINGE-NEEDLE U-100) use to inject insulin twice daily Brand medically necessary #100 x 7   Entered and Authorized by:   Julaine Fusi  DO   Signed by:   Julaine Fusi  DO on 03/17/2010   Method used:   [facility] to        [facility] [phone]* (retail)       [address] [place]  [address]       Ph: [phone] or [phone]       Fax: [phone]   RxID:   [id number] LANCETS   MISC (LANCETS) to test blood sugar four times a day  #200 x 11   Entered and Authorized by:   Julaine Fusi  DO   Signed by:   Julaine Fusi  DO on 03/17/2010   Method used:   [facility] to        [facility] [phone]* (retail)       [address] [place]  [address]       Ph: [phone] or [phone]       Fax: [phone]   RxID:   [id number] FREESTYLE LITE   STRP (GLUCOSE BLOOD) to test blood sugar four times a day before meals and bedtime  #150 Each x 10   Entered and  Authorized by:   Julaine Fusi  DO   Signed by:   Julaine Fusi  DO on 03/17/2010   Method used:   [facility]        [facility] [phone]* (retail)       [address], [place]  [address]       Ph: [phone] or [phone]       Fax: [phone]   RxID:   [id number]   Prevention & Chronic Care Immunizations   Influenza vaccine: Fluvax MCR  (08/31/2007)    Tetanus booster: Not documented    Pneumococcal vaccine: Not documented  Other Screening   Pap smear: Not documented   Pap smear action/deferral: Not indicated S/P hysterectomy  (06/11/2009)    Mammogram: Not documented   Mammogram action/deferral: Deferred to age 89  (03/17/2010)   Smoking status: never  (03/17/2010)  Diabetes Mellitus   HgbA1C: 6.3  (03/17/2010)    Eye exam: Glaucoma.   Intraocular pressure OD:  elevated    Intraocular pressure OS:  elevated   (01/22/2009)   Eye exam due: 01/22/2010    Foot exam: yes  (12/13/2008)   Foot exam action/deferral: Do today   High risk foot: Not documented   Foot care education: Not documented    Urine microalbumin/creatinine ratio: 2.8  (12/13/2008)    Diabetes flowsheet reviewed?: Yes   Progress toward A1C goal: At goal  Lipids   Total Cholesterol: 195  (12/13/2008)   Lipid panel action/deferral: Lipid Panel ordered   LDL: 104  (12/13/2008)   LDL Direct: Not documented   HDL: 74  (12/13/2008)   Triglycerides: 87  (  12/13/2008)    SGOT (AST): 25  (12/13/2008)   BMP action: Ordered   SGPT (ALT): 28  (12/13/2008) CMP ordered    Alkaline phosphatase: 70  (12/13/2008)   Total bilirubin: 0.4  (12/13/2008)    Lipid flowsheet reviewed?: Yes   Progress toward LDL goal: Unchanged  Hypertension   Last Blood Pressure: 99 / 68  (03/17/2010)   Serum creatinine: 0.97  (12/13/2008)   BMP action: Ordered   Serum potassium 4.4  (12/13/2008) CMP ordered     Hypertension flowsheet reviewed?: Yes   Progress toward BP goal:  Unchanged  Self-Management Support :    Patient will work on the following items until the next clinic visit to reach self-care goals:     Medications and monitoring: take my medicines every day, check my blood sugar, bring all of my medications to every visit, examine my feet every day  (03/17/2010)     Eating: drink diet soda or water instead of juice or soda, eat more vegetables, use fresh or frozen vegetables, eat foods that are low in salt, eat baked foods instead of fried foods, eat fruit for snacks and desserts, limit or avoid alcohol  (03/17/2010)     Activity: take a 30 minute walk every day  (03/17/2010)    Diabetes self-management support: Written self-care plan, Education handout, Resources for patients handout  (03/17/2010)   Diabetes care plan printed   Diabetes education handout printed   Last diabetes self-management training by diabetes educator: 04/16/2009   Last medical nutrition therapy: 12/24/2008    Hypertension self-management support: Written self-care plan, Education handout, Pre-printed educational material, Resources for patients handout  (03/17/2010)   Hypertension self-care plan printed.   Hypertension education handout printed    Lipid self-management support: Written self-care plan, Education handout, Pre-printed educational material, Resources for patients handout  (03/17/2010)   Lipid self-care plan printed.   Lipid education handout printed      Resource handout printed.   Nursing Instructions: Diabetic foot exam today     Vital Signs:  Patient profile:   51 year old female Height:      48.9 inches (124.21 cm) Weight:      172 pounds (78.18 kg) BMI:     50.76 O2 Sat:      98 % Temp:     97.3 degrees F (36.28 degrees C) oral Pulse rate:   94 / minute BP sitting:   99 / 68  (left arm) Cuff size:   regular  Vitals Entered By: Angelina Ok RN (Mar 17, 2010 10:14 AM)  O2 Flow:  Room air  Process Orders Check Orders Results:     [facility]: Check successful Tests Sent for requisitioning (Mar 24, 2010 10:31 PM):     03/17/2010: [facility] -- T-Lipid Profile [id number] (signed)     03/17/2010: [facility] -- T-Comprehensive Metabolic Panel [[id number]] (signed)     03/17/2010: [facility] -- [facility] w/Diff [[id number]] (signed)     Laboratory Results   Blood Tests   Date/Time Received: Mar 17, 2010 10:33 AM Date/Time Reported: Alric Quan  Mar 17, 2010 10:33 AM  HGBA1C: 6.3%   (Normal Range: Non-Diabetic - 3-6%   Control Diabetic - 6-8%) CBG Random:: 147mg /dL

## 2010-12-18 NOTE — Letter (Signed)
Summary: [facility] [employer] [facility]  [facility]   Imported By: Margie Billet 03/31/2010 12:08:55  _____________________________________________________________________  External Attachment:    Type:   Image     Comment:   External Document

## 2010-12-18 NOTE — Assessment & Plan Note (Signed)
Summary: ACUTE-GOUT ON FEET RASH ON BACK/CFB(GOLDING)   Vital Signs:  Patient profile:   50 year old female Height:      48.9 inches (124.21 cm) Weight:      157.5 pounds (71.59 kg) BMI:     46.48 Temp:     97.5 degrees F (36.39 degrees C) oral Pulse rate:   69 / minute BP sitting:   111 / 72  (left arm) Cuff size:   large  Vitals Entered By: Cynda Familia Duncan Dull) (August 06, 2010 11:45 AM) CC: rash (multiple sites), ?gout  Is Patient Diabetic? Yes Nutritional Status BMI of > 30 = obese CBG Result 95  Have you ever been in a relationship where you felt threatened, hurt or afraid?Unable to ask  Domestic Violence Intervention female at side  Does patient need assistance? Functional Status Cook/clean, Shopping, Social activities Ambulation Impaired:Risk for fall Comments visual impaired    Primary Care Lavra Imler:  Julaine Fusi  DO  CC:  rash (multiple sites) and ?gout .  History of Present Illness: This is 50 year old female with h/o DM, HTN, HLD, Down syndrom, Glaucoma s/p nucleation who presents with a rash and pain...  1. rash: started 2 weeks ago, is progressively worth, is severely itching especially if it is warm and during the night.  Used benadryl and vasalin  but it did not improve.  No changes in detergent or  diet. But 74 year old granddaughter from sister visited who  had  a similiar rash.  Sister noted that it is scaly and there are lesions all over the body especially in the back and the legs.   2. pain in the right foot: started last week and it is getting better but it is still present. Similar episodes in the past with gout flair.     Preventive Screening-Counseling & Management  Alcohol-Tobacco     Smoking Status: never  Current Medications (verified): 1)  Humulin 70/30 70-30 % Susp (Insulin Isophane & Reg (Human)) .... Inject 20 Units Subcutaneously Every Morning and 10 Units At Night 2)  Hydrochlorothiazide 25 Mg  Tabs (Hydrochlorothiazide) ....  Take 1 Tablet By Mouth Once A Day 3)  Freestyle Lite   Strp (Glucose Blood) .... To Test Blood Sugar Four Times A Day Before Meals and Bedtime 4)  Lancets   Misc (Lancets) .... To Test Blood Sugar Four Times A Day 5)  Bd Insulin Syringe 31g X 5/16" 0.3 Ml Misc (Insulin Syringe-Needle U-100) .... Use To Inject Insulin Twice Daily 6)  Freestyle Lite  Devi (Blood Glucose Monitoring Suppl) .Marland Kitchen.. 1 Meter Dx: 250.0 Use As Directed 7)  Voltaren 1 % Gel (Diclofenac Sodium) .... Apply Topically To Back of Neck and Shoulders Two Times A Day As Directed  Allergies: No Known Drug Allergies  Review of Systems       As per HPI .  Physical Exam  General:  Well-developed,well-nourished,in no acute distress; alert,appropriate and cooperative throughout examination Lungs:  Normal respiratory effort, chest expands symmetrically. Lungs are clear to auscultation, no crackles or wheezes. Heart:  Normal rate and regular rhythm. S1 and S2 normal without gallop, murmur, click, rub or other extra sounds. Abdomen:  Bowel sounds positive,abdomen soft and non-tender without masses, organomegaly or hernias noted. Msk:  joint tenderness, joint swelling, and joint warmth.   Pulses:  R and L carotid,radial,dorsalis pedis and posterior tibial pulses are full and equal bilaterally Extremities:  No clubbing, cyanosis, edema. But sever calusus on the plantar service.  Right foot: decreased range of motion in the ankle and metatarsal-phlangeal joint,  joint tenderness, joint swelling, and joint warmth but redness.  Left foot within normal limits. Skin:  pruritic eruption on the back. although  h/o of contact to scabies: no characteristic lesions and distribution like between fingers , wrist,etc.  There are some suspicious lesion like bumps on the back which has been present since childhood.   Impression & Recommendations:  Problem # 1:  GOUT, ACUTE (ICD-274.01) Patient presented with metatarsal-phalangeal   joint  tenderness, joint swelling, and joint warmth most consistent with acute gout. Patient was prescribed Colchicine and Naproxen for 10 days.  Pt had episodes in the past which presented in a similiar way.  Her updated medication list for this problem includes:    Colcrys 0.6 Mg Tabs (Colchicine) .Marland Kitchen... Take one tablet by mouth two times a day for 10 days and then stop  Problem # 2:  SCABIES (ICD-133.0) Presentation and history with recent contact to child with scabies,  the skin rash is most consistent with scabies. Patient was prescribed Permethrin 5% topical cream and was advised to apply to entire body and repeat  application after 1 week. Furthermore pt was advised about the importance that all household members has to be treated and all washing clothing, bedding and towels should be washed in hot water. Pt was informed  that   the pruritis can last up to 2 wks and she can take benadryl for that.    Complete Medication List: 1)  Humulin 70/30 70-30 % Susp (Insulin isophane & reg (human)) .... Inject 20 units subcutaneously every morning and 10 units at night 2)  Hydrochlorothiazide 25 Mg Tabs (Hydrochlorothiazide) .... Take 1 tablet by mouth once a day 3)  Freestyle Lite Strp (Glucose blood) .... To test blood sugar four times a day before meals and bedtime 4)  Lancets Misc (Lancets) .... To test blood sugar four times a day 5)  Bd Insulin Syringe 31g X 5/16" 0.3 Ml Misc (Insulin syringe-needle u-100) .... Use to inject insulin twice daily 6)  Freestyle Lite Devi (Blood glucose monitoring suppl) .Marland Kitchen.. 1 meter dx: 250.0 use as directed 7)  Voltaren 1 % Gel (Diclofenac sodium) .... Apply topically to back of neck and shoulders two times a day as directed 8)  Colcrys 0.6 Mg Tabs (Colchicine) .... Take one tablet by mouth two times a day for 10 days and then stop 9)  Naproxen 500 Mg Tabs (Naproxen) .... Take one tablet by mouth two times a day for 10 days and then stop. 10)  Permethrin 5 % Crea  (Permethrin) .... Apply on entire body from head to toe and especially between the fingers and toes. repeat after 1 week.  Other Orders: T- Capillary Blood Glucose ([id number]) T-Hgb A1C (in-house) (16109UE)  Patient Instructions: 1)  Please schedule an appointment with your primary doctor in 2- 4 weeks.  2)  The entire household who came in contact with you should be treated. All washing clothing, bedding and towels should be washed in hot water. Items that cannot be washed can alternatively be isolated from use for 3 days. The itching may persist for up to 2 wks. Take benadryl for itching as needed.  Prescriptions: PERMETHRIN 5 % CREA (PERMETHRIN) Apply on entire body from head to toe and especially between the fingers and toes. Repeat after 1 week.  #6 tubes x 21   Entered and Authorized by:   Almyra Deforest MD   Signed by:  Almyra Deforest MD on 08/06/2010   Method used:   [facility] to        [facility] [phone]* (retail)       [address] [place]  [address]       Ph: [phone] or [phone]       Fax: [phone]   RxID:   [id number] NAPROXEN 500 MG TABS (NAPROXEN) Take one tablet by mouth two times a day for 10 days and then stop.  #20 x 0   Entered and Authorized by:   Almyra Deforest MD   Signed by:   Almyra Deforest MD on 08/06/2010   Method used:   [facility] to        [facility] [phone]* (retail)       [address] [place]  [address]       Ph: [phone] or [phone]       Fax: [phone]   RxID:   [id number] COLCRYS 0.6 MG TABS (COLCHICINE) Take one tablet by mouth two times a day for 10 days and then stop  #20 x 0   Entered and Authorized by:   Almyra Deforest MD   Signed by:   Almyra Deforest MD on 08/06/2010   Method used:   [facility] to        [facility] [phone]* (retail)       [address], [place]  [address]       Ph: [phone] or [phone]       Fax: [phone]   RxID:    [id number]    Prevention & Chronic Care Immunizations   Influenza vaccine: Fluvax MCR  (08/31/2007)    Tetanus booster: Not documented    Pneumococcal vaccine: Not documented  Other Screening   Pap smear: Not documented   Pap smear action/deferral: Not indicated S/P hysterectomy  (06/11/2009)    Mammogram: Not documented   Mammogram action/deferral: Deferred to age 50  (03/17/2010)   Smoking status: never  (08/06/2010)  Diabetes Mellitus   HgbA1C: 6.6  (08/06/2010)    Eye exam: Glaucoma.   Intraocular pressure OD:  elevated    Intraocular pressure OS:  elevated   (01/22/2009)   Eye exam due: 01/22/2010    Foot exam: yes  (12/13/2008)   Foot exam action/deferral: Do today   High risk foot: Not documented   Foot care education: Not documented    Urine microalbumin/creatinine ratio: 2.8  (12/13/2008)  Lipids   Total Cholesterol: 195  (12/13/2008)   Lipid panel action/deferral: Lipid Panel ordered   LDL: 104  (12/13/2008)   LDL Direct: Not documented   HDL: 74  (12/13/2008)   Triglycerides: 87  (12/13/2008)    SGOT (AST): 25  (12/13/2008)   BMP action: Ordered   SGPT (ALT): 28  (12/13/2008)   Alkaline phosphatase: 70  (12/13/2008)   Total bilirubin: 0.4  (12/13/2008)  Hypertension   Last Blood Pressure: 111 / 72  (08/06/2010)   Serum creatinine: 0.97  (12/13/2008)   BMP action: Ordered   Serum potassium 4.4  (12/13/2008)  Self-Management Support :    Patient will work on the following items until the next clinic visit to reach self-care goals:     Medications and monitoring: take my medicines every day  (08/06/2010)     Eating: eat foods that are low in  salt  (08/06/2010)     Activity: take a 30 minute walk every day  (03/17/2010)    Diabetes self-management support: Resources for patients handout  (08/06/2010)   Last diabetes self-management training by diabetes educator: 04/16/2009   Last medical nutrition therapy: 12/24/2008    Hypertension  self-management support: Resources for patients handout  (08/06/2010)    Lipid self-management support: Resources for patients handout  (08/06/2010)        Resource handout printed.   Nursing Instructions: Diabetic foot exam today    Laboratory Results   Blood Tests   Date/Time Received: August 06, 2010 12:13 PM Date/Time Reported: Burke Keels  August 06, 2010 12:13 PM   HGBA1C: 6.6%   (Normal Range: Non-Diabetic - 3-6%   Control Diabetic - 6-8%) CBG Random:: 95mg /dL      Appended Document: ACUTE-GOUT ON FEET RASH ON BACK/CFB(GOLDING) Patient is scheduled for an appointment with Podiatrist tomorrow for better foot care and calcalus removal on both plantar surface .

## 2011-01-14 ENCOUNTER — Other Ambulatory Visit: Payer: Self-pay | Admitting: Internal Medicine

## 2011-01-26 LAB — GLUCOSE, CAPILLARY: Glucose-Capillary: 184 mg/dL — ABNORMAL HIGH (ref 70–99)

## 2011-01-29 ENCOUNTER — Ambulatory Visit: Payer: Self-pay | Admitting: Internal Medicine

## 2011-01-29 LAB — GLUCOSE, CAPILLARY: Glucose-Capillary: 95 mg/dL (ref 70–99)

## 2011-02-03 LAB — GLUCOSE, CAPILLARY: Glucose-Capillary: 147 mg/dL — ABNORMAL HIGH (ref 70–99)

## 2011-02-12 ENCOUNTER — Ambulatory Visit: Payer: Self-pay | Admitting: Internal Medicine

## 2011-02-18 ENCOUNTER — Other Ambulatory Visit: Payer: Self-pay | Admitting: Internal Medicine

## 2011-03-02 LAB — GLUCOSE, CAPILLARY: Glucose-Capillary: 154 mg/dL — ABNORMAL HIGH (ref 70–99)

## 2011-03-03 ENCOUNTER — Ambulatory Visit (INDEPENDENT_AMBULATORY_CARE_PROVIDER_SITE_OTHER): Payer: MEDICARE | Admitting: Internal Medicine

## 2011-03-03 ENCOUNTER — Encounter: Payer: Self-pay | Admitting: Internal Medicine

## 2011-03-03 VITALS — BP 114/76 | HR 60 | Temp 98.0°F | Ht <= 58 in | Wt 159.3 lb

## 2011-03-03 DIAGNOSIS — Z0000 Encounter for general adult medical examination without abnormal findings: Secondary | ICD-10-CM

## 2011-03-03 DIAGNOSIS — I10 Essential (primary) hypertension: Secondary | ICD-10-CM

## 2011-03-03 DIAGNOSIS — M109 Gout, unspecified: Secondary | ICD-10-CM

## 2011-03-03 DIAGNOSIS — E785 Hyperlipidemia, unspecified: Secondary | ICD-10-CM

## 2011-03-03 DIAGNOSIS — E119 Type 2 diabetes mellitus without complications: Secondary | ICD-10-CM

## 2011-03-03 LAB — GLUCOSE, CAPILLARY: Glucose-Capillary: 93 mg/dL (ref 70–99)

## 2011-03-03 MED ORDER — INSULIN NPH ISOPHANE & REGULAR (70-30) 100 UNIT/ML ~~LOC~~ SUSP: SUBCUTANEOUS | Status: DC

## 2011-03-03 MED ORDER — LISINOPRIL-HYDROCHLOROTHIAZIDE 10-12.5 MG PO TABS: 1.0000 | ORAL_TABLET | Freq: Every day | ORAL | Status: DC

## 2011-03-03 MED ORDER — ALLOPURINOL 100 MG PO TABS: 100.0000 mg | ORAL_TABLET | Freq: Every day | ORAL | Status: DC

## 2011-03-03 MED ORDER — COLCHICINE 0.6 MG PO TABS: 0.6000 mg | ORAL_TABLET | Freq: Every day | ORAL | Status: DC

## 2011-03-03 NOTE — Assessment & Plan Note (Addendum)
Patient diabetes is excellently controlled on insulin 70/30, changed her medications are needed, she's up-to-date on her routine screening.

## 2011-03-03 NOTE — Assessment & Plan Note (Signed)
Patient's LDL is slightly elevated at 123, I discussed the benefits and the potential side effects of starting any medications, after our discussion the patient's sister at this point does not want to start the patient on statins.

## 2011-03-03 NOTE — Progress Notes (Signed)
  Subjective:    Patient ID: Linda Lynn, female    DOB: 04/25/1961, 50 y.o.   MRN: [id number]  HPI  She is a 50 year old female with a past medical history of Down syndrome, hypertension, diabetes, and gout. Presents to outpatient clinic with her sister for routine follow up. Patient has no complaints, her sister is the primary caregiver, and communicate with me on behalf of the patient. We discussed routine screening. Current medication management. Possible changes that can be done to her current regiment. Also discussed her most recent labs.   Review of Systems  [all other systems reviewed and are negative       Objective:   Physical Exam  [nursing notereviewed. Constitutional: She is oriented to person, place, and time. She appears well-developed and well-nourished.  HENT:  Head: Normocephalic and atraumatic.  Eyes: Pupils are equal, round, and reactive to light.  Neck: Normal range of motion. Neck supple. No JVD present. No thyromegaly present.  Cardiovascular: Normal rate, regular rhythm and normal heart sounds.   No murmur heard. Pulmonary/Chest: Effort normal and breath sounds normal. She has no wheezes. She has no rales.  Abdominal: Soft. Bowel sounds are normal.  Musculoskeletal: Normal range of motion. She exhibits no edema.  Neurological: She is alert and oriented to person, place, and time.  Skin: Skin is warm and dry.          Assessment & Plan:

## 2011-03-03 NOTE — Assessment & Plan Note (Signed)
Well-controlled, however given that the patient is diabetic I will change her hydrochlorothiazide to lisinopril/hydrochlorothiazide and rate the patient back in one month to recheck her blood pressure along with a basic metabolic panel.

## 2011-03-03 NOTE — Assessment & Plan Note (Signed)
Stable,

## 2011-03-04 ENCOUNTER — Encounter: Payer: Self-pay | Admitting: Internal Medicine

## 2011-03-17 ENCOUNTER — Other Ambulatory Visit: Payer: Self-pay | Admitting: Internal Medicine

## 2011-03-25 ENCOUNTER — Encounter: Payer: Self-pay | Admitting: Internal Medicine

## 2011-03-25 ENCOUNTER — Ambulatory Visit (INDEPENDENT_AMBULATORY_CARE_PROVIDER_SITE_OTHER): Payer: MEDICARE | Admitting: Internal Medicine

## 2011-03-25 VITALS — BP 134/88 | HR 54 | Temp 97.7°F | Ht <= 58 in | Wt 167.6 lb

## 2011-03-25 DIAGNOSIS — E119 Type 2 diabetes mellitus without complications: Secondary | ICD-10-CM

## 2011-03-25 DIAGNOSIS — I10 Essential (primary) hypertension: Secondary | ICD-10-CM

## 2011-03-25 LAB — BASIC METABOLIC PANEL
CO2: 26 mEq/L (ref 19–32)
Chloride: 103 mEq/L (ref 96–112)
Glucose, Bld: 99 mg/dL (ref 70–99)
Sodium: 140 mEq/L (ref 135–145)

## 2011-03-25 LAB — GLUCOSE, CAPILLARY: Glucose-Capillary: 114 mg/dL — ABNORMAL HIGH (ref 70–99)

## 2011-03-25 MED ORDER — HYDROCHLOROTHIAZIDE 12.5 MG PO TABS: 12.5000 mg | ORAL_TABLET | Freq: Every day | ORAL | Status: DC

## 2011-03-25 NOTE — Progress Notes (Signed)
  Subjective:    Patient ID: Linda Lynn, female    DOB: [birth year], 50 y.o.   MRN: [id number]  HPI  Patient is a 50 year old female with a PMH of diabetes, HTN, Down's syndrome, and gout who is here for a recheck of her blood pressure and labs following the switch from HCTZ to HCTZ/Lisinopril.  She is here with her home health aide.  She denies any side effects of the lisinopril such as cough, dizziness on standing, or decreased urination.    Review of Systems    Constitutional: Denies fever, chills, diaphoresis, appetite change and fatigue.  HEENT: Denies photophobia, eye pain, redness, hearing loss, ear pain, congestion, sore throat, rhinorrhea, sneezing, mouth sores, trouble swallowing, neck pain, neck stiffness and tinnitus.   Respiratory: Denies SOB, DOE, cough, chest tightness,  and wheezing.   Cardiovascular: Denies chest pain, palpitations and leg swelling.  Gastrointestinal: Denies nausea, vomiting, abdominal pain, diarrhea, constipation, blood in stool and abdominal distention.  Genitourinary: Denies dysuria, urgency, frequency, hematuria, flank pain and difficulty urinating.  Musculoskeletal: Denies myalgias, back pain, joint swelling, arthralgias and gait problem.  Skin: Denies pallor, rash and wound.  Neurological: Denies dizziness, seizures, syncope, weakness, light-headedness, numbness and headaches.  Hematological: Denies adenopathy. Easy bruising, personal or family bleeding history  Psychiatric/Behavioral: Denies suicidal ideation, mood changes, confusion, nervousness, sleep disturbance and agitation   Objective:   Physical Exam    Constitutional: Vital signs reviewed.  Patient is a well-developed and well-nourished female in no acute distress and cooperative with exam. Alert and oriented x3.  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    conjunctivae normal, No scleral icterus.  Neck: Supple, Trachea midline normal ROM, No JVD, mass, thyromegaly, or carotid bruit present.  Cardiovascular: RRR, S1 normal, S2 normal, no MRG, pulses symmetric and intact bilaterally Pulmonary/Chest: CTAB, no wheezes, rales, or rhonchi Abdominal: Soft. Non-tender, non-distended, bowel sounds are normal, no masses, organomegaly, or guarding present.  GU: no CVA tenderness Musculoskeletal: No joint deformities, erythema, or stiffness, ROM full and no nontender Skin: Warm, dry and intact. No rash, cyanosis, or clubbing.  Psychiatric: Normal mood and affect. speech and behavior is normal. thought content normal.   Assessment & Plan:

## 2011-03-25 NOTE — Assessment & Plan Note (Signed)
Lab Results  Component Value Date   HGBA1C 6.4 03/03/2011   HGBA1C 6.4 10/27/2010   HGBA1C 6.6 08/06/2010   Lab Results  Component Value Date   MICROALBUR 0.75 10/27/2010   LDLCALC 123* 10/27/2010   CREATININE 1.01 10/27/2010   She is up to date on her diabetes checks.  See Dr. Rosemarie Ax note from 03/03/11 about the decision to not start statin therapy.

## 2011-03-25 NOTE — Patient Instructions (Signed)
Stop in the laboratory for a quick blood test.  If there is something that needs to be discussed I will call you.  Continue taking all your other medications as directed.

## 2011-03-25 NOTE — Assessment & Plan Note (Addendum)
Blood pressure today is mildly elevated from her previous.  Repeat blood pressure even higher then before.  We will plan on increasing her HCTZ back to 25 mg and keep the Lisinopril at 10 mg.  She will have a bmet drawn today to ensure her creatinine did not increase after starting lisinopril.  If we need to adjust it I will plan on calling them to stop the lisinopril.  Basic Metabolic Panel:    Component Value Date/Time   NA 140 03/25/2011 0957   K 4.7 03/25/2011 0957   CL 103 03/25/2011 0957   CO2 26 03/25/2011 0957   BUN 24* 03/25/2011 0957   CREATININE 1.13 03/25/2011 0957   CREATININE 1.01 10/27/2010 2201   GLUCOSE 99 03/25/2011 0957   CALCIUM 8.5 03/25/2011 0957   Bmet following addition of Lisinopril is acceptable so she is cleared to continue the medication.

## 2011-04-03 NOTE — Discharge Summary (Signed)
[name]  Patient:    Linda Lynn, Linda Lynn Visit Number: [id number] MRN: [id number]          Service Type: MED Location: [phone] Attending Physician:  Madaline Guthrie Dictated by:   Hillery Aldo, M.D. Admit Date:  04/26/2002 Discharge Date: 04/29/2002   CC:         Madaline Guthrie, M.D.   Discharge Summary  DISCHARGE DIAGNOSES 1. 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 2. Trisomy 21. 3. Insulin dependent diabetes mellitus. 4. 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 5. Glaucoma.  DISCHARGE MEDICATIONS 1. Lotensin 10 mg p.o. q.d. 2. Insulin 70/30, 30 units subcu q.a.m., 20 units subcu q.p.m. 3. Tequin 400 mg p.o. q.d. x 6 weeks. 4. Vicodin 5/500 1 to 2 tablets q.6h. p.r.n. back pain.  PROCEDURES/DIAGNOSTIC STUDIES  1. Chest x-ray on April 26, 2002, mild cardiomegaly with vascular congestion. Unchanged since September 02, 1999.  2. Lumbar spine, four fused, April 26, 2002, irregularity of the endplates of L3-L4 and more pronounced at L4-L5. Question diskitis. Recommend MR. Increased density of L3 through L5. Again, this could be further evaluated with MENTAL MR.  3. An MR of the lumbar spine without and with contrast media, April 26, 2002. Discogenic changes, most notable at L3-4 and L4-L5. At the L3-4 level, right greater than left neuroforaminal narrowing. At the L4-5 level, left greater than right neuroforaminal narrowing. At each of these levels there is significant spinal stenosis (slightly more notable at the L4-5 level). Enhancement surrounding the left aspect of L4-5 may be discogenic in origin. However, infection cannot be excluded. The abnormal appearance of the anterior superior aspect of the T12 vertebra may also be discogenic in origin, although infection cannot be excluded in the appropriate clinical setting. This will require close followup scanning, at which time sedation may be  considered given the fact that the present examination was difficult and motion degraded.  CONSULTANTS:  Lacretia Leigh. Ninetta Lights, M.D., infectious disease.  HISTORY OF PRESENT ILLNESS:  The patient is a 50 year old African-American female with a history of mental retardation secondary to Down syndrome and insulin dependent diabetes, who presents with a two day history of back pain and repeated fevers. The patient reportedly fell over onto her abdomen one to two days prior to her admission and has been complaining of back pain ever since. The pain is made worse with movement. No other history is available at the time of this dictation and the patient is a limited historian. The patient does report some upper respiratory symptoms including cough for three days prior to her admission.  PHYSICAL EXAMINATION  VITAL SIGNS:  Temperature 99.6, blood pressure 120/78, pulse 87, respiratory rate 20, oxygen saturation 97% on room air.  GENERAL:  An overweight, middle-aged female in no apparent distress. She is hyperphagic.  HEENT:  Normocephalic. The left eye is enucleated with a crusty exudate. The right pupil was reactive. The oropharynx has mild erythema surrounding the tonsillar area. Otherwise clear.  NECK:  Supple, no lymphadenopathy, no thyromegaly.  LUNGS:  Clear to auscultation bilaterally with good air movement.  HEART:  Regular rate and rhythm, no murmurs, rubs or gallops.  ABDOMEN:  Soft, nontender, nondistended with positive bowel sounds x 4.  BACK:  No costovertebral angle tenderness. She does have some point tenderness at the L3 to L4 area and the paraspinal area around this level.  EXTREMITIES:  No cyanosis, clubbing or edema.  NEUROLOGIC:  The patient moves all extremities x  4. No focal deficits noted.  ADMISSION LABORATORY DATA:  Blood cultures were negative. PT was 14.4, INR 1.1, PTT 30. C-reactive protein was high at 6.1. Sodium 137, potassium 3.8, chloride 101,  bicarbonate 30, BUN 17, creatinine 1.0, glucose 104. Liver function studies were within normal limits with the exception of albumin which was low at 2.7. White count 10.1, hemoglobin 12.4, hematocrit 37.8, platelets 277. There were 57% neutrophils, 35% leukocytes, 8% monocytes, and 1% basophils on differential. ESR 74. Urinalysis was negative for nitrites, leukocyte esterase, protein and ketones.  HOSPITAL COURSE BY PROBLEM  #1 - BACK PAIN:  The patient was admitted and an ID consultation was obtained. The patient was not a candidate for a CT guided aspiration secondary to her inability to lie still and be quiet. It was felt that general anesthesia would be risky for this patient, and that she would likely be treated empirically anyway, so biopsy was not pursued. The patient was started empirically on Tequin at 400 mg p.o. q.d. per Dr. Tyrone Apple recommendations. He also advised [place] to check a C-reactive protein level. Additionally, a PPD was placed and was not reactive prior to her discharge. She was discharged to the care of her sister as specified above.  FOLLOWUP:  The patient will follow up in the clinic in three weeks, and at that time a repeat C-reactive protein test will be obtained. If the C-reactive protein is elevated from her baseline, we will repeat the MRI. If it is less than her baseline, we will continue to treat empirically and recheck an ACRPN MRI in six weeks.  DISCHARGE INSTRUCTIONS:  The patient was instructed to resume activity as tolerated. She is to maintain a diabetic diet. She is to call the clinic for any worsening back pain or fever. She will follow up as specified. Dictated by:   Hillery Aldo, M.D. Attending Physician:  Madaline Guthrie DD:  05/01/02 TD:  05/02/02 Job: [id number]

## 2011-04-16 ENCOUNTER — Other Ambulatory Visit: Payer: Self-pay | Admitting: Licensed Clinical Social Worker

## 2011-04-16 ENCOUNTER — Telehealth: Payer: Self-pay | Admitting: Licensed Clinical Social Worker

## 2011-04-16 DIAGNOSIS — Q909 Down syndrome, unspecified: Secondary | ICD-10-CM

## 2011-04-16 NOTE — Telephone Encounter (Signed)
Renewal of [facility].  Review and completion of FL-2.  Called Mary Allred at [facility] and she will pick up this PM.

## 2011-04-30 NOTE — Progress Notes (Signed)
Addended by: Bufford Spikes on: 04/30/2011 09:36 AM   Modules accepted: Orders

## 2011-05-19 ENCOUNTER — Other Ambulatory Visit: Payer: Self-pay | Admitting: Internal Medicine

## 2011-05-19 NOTE — Telephone Encounter (Signed)
A1C is due later this month. No appt in EPIC that I can see. Will refill and request appt.

## 2011-07-23 ENCOUNTER — Other Ambulatory Visit: Payer: Self-pay | Admitting: Internal Medicine

## 2011-07-24 ENCOUNTER — Ambulatory Visit: Payer: Self-pay | Admitting: Internal Medicine

## 2011-07-24 ENCOUNTER — Encounter: Payer: MEDICARE | Admitting: Internal Medicine

## 2011-07-29 ENCOUNTER — Ambulatory Visit: Payer: Self-pay | Admitting: Internal Medicine

## 2011-07-30 ENCOUNTER — Encounter: Payer: Self-pay | Admitting: Internal Medicine

## 2011-07-30 NOTE — Telephone Encounter (Signed)
Ms Linda Lynn has an appt on 9/20. I will see her then and address A1C and med refills. Thanks

## 2011-08-06 ENCOUNTER — Encounter: Payer: Self-pay | Admitting: Internal Medicine

## 2011-08-06 ENCOUNTER — Ambulatory Visit (INDEPENDENT_AMBULATORY_CARE_PROVIDER_SITE_OTHER): Payer: Medicaid Other | Admitting: Internal Medicine

## 2011-08-06 VITALS — BP 93/60 | HR 78 | Temp 98.2°F | Ht 65.0 in | Wt 163.1 lb

## 2011-08-06 DIAGNOSIS — E119 Type 2 diabetes mellitus without complications: Secondary | ICD-10-CM

## 2011-08-06 DIAGNOSIS — Z23 Encounter for immunization: Secondary | ICD-10-CM

## 2011-08-06 DIAGNOSIS — E785 Hyperlipidemia, unspecified: Secondary | ICD-10-CM

## 2011-08-06 DIAGNOSIS — Z0000 Encounter for general adult medical examination without abnormal findings: Secondary | ICD-10-CM

## 2011-08-06 DIAGNOSIS — M109 Gout, unspecified: Secondary | ICD-10-CM

## 2011-08-06 DIAGNOSIS — I10 Essential (primary) hypertension: Secondary | ICD-10-CM

## 2011-08-06 MED ORDER — INSULIN NPH ISOPHANE & REGULAR (70-30) 100 UNIT/ML ~~LOC~~ SUSP: SUBCUTANEOUS | Status: DC

## 2011-08-06 NOTE — Progress Notes (Signed)
  Subjective:   Patient ID: Linda Lynn female   DOB: July 29, 1961 50 y.o.   MRN: [id number]  HPI: Linda Lynn is a 50 y.o. woman with past medical history significant for Down syndrome, diabetes, hypertension, hyperlipidemia, and. Gout. She was sent for management of her diabetes and hypertension.  Per report form her healthcare aide, who is with [name] today, Ms. Kisling has been doing well since her last visit. Her nurse's aide reports no issues. She has had no episodes of gout. The aide did not bring glucometer or record of blood glucose to today's visit. She also does not know which medications Linda Lynn is taking. There are 2 different types of insulin on her medical record today's visit. Novolin and Humulin both 70/30 at different doses. Also both hydrochlorothiazide 12.5 mg tablets daily and lisinopril-HCTZ 10-12.5 mg  combo pill are listed on the medical record.   Per the aide, Linda Lynn will see her ophthalmologist and her podiatrist next week.  I contacted Ms. Wire sister, Linda Lynn, to see if she in new which medications Linda Lynn was taking. Ms. Linda Lynn states Linda Lynn is taking Novolin 70/30    20 units in the morning and 10 units at night. And that she is taking only HCTZ 12.5 mg daily.     Current Outpatient Prescriptions  Medication Sig Dispense Refill  . allopurinol (ZYLOPRIM) 100 MG tablet Take 1 tablet (100 mg total) by mouth daily.  30 tablet  11  . hydrochlorothiazide (HYDRODIURIL) 12.5 MG tablet Take 1 tablet (12.5 mg total) by mouth daily.  30 tablet  11  . insulin NPH-insulin regular (NOVOLIN 70/30) (70-30) 100 UNIT/ML injection Inject 16 units subcutaneously in the morning and 8 units at night  10 mL  3  . colchicine 0.6 MG tablet Take 1 tablet (0.6 mg total) by mouth daily.  30 tablet  11    Social history: Does not smoke drink or use illegal drugs.  Review of Systems: Denies fevers, chills, shortness of breath, chest pain, abdominal pain, headache  or other issues.  Objective:  Physical Exam: Filed Vitals:   08/06/11 0952  BP: 93/60  Pulse: 78  Temp: 98.2 F (36.8 C)  TempSrc: Oral  Height: 5\' 5"  (1.651 m)  Weight: 163 lb 1.6 oz (73.982 kg)   Constitutional: Vital signs reviewed.  Patient is an obese woman in no acute distress and cooperative with exam.  Head: Normocephalic and atraumatic Ear: TM normal bilaterally Mouth: no erythema or exudates, MMM Eyes: Left eye does not open. The iris of the right eye is milky white with no pupil. Neck: Supple, Trachea midline normal ROM, No JVD, mass, thyromegaly, or carotid bruit present.  Cardiovascular: RRR, S1 normal, S2 normal, no MRG, pulses symmetric and intact bilaterally Pulmonary/Chest: CTAB, no wheezes, rales, or rhonchi Abdominal: Soft. Non-tender, non-distended, bowel sounds are normal, no masses, organomegaly, or guarding present.   extremities: No edema, bilateral feet are callused, but skin is intact  Neurological: no focal motor deficit, sensory intact to light touch bilaterally.  Skin: Warm, dry and intact. No rash, cyanosis, or clubbing.  Psychiatric: Normal mood and pleasant affect. Only oriented to self.

## 2011-08-06 NOTE — Progress Notes (Signed)
I agree with assessment and plan as per Dr. Candy Sledge.

## 2011-08-06 NOTE — Assessment & Plan Note (Signed)
Stable on allopurinol daily. No episodes.

## 2011-08-06 NOTE — Assessment & Plan Note (Signed)
Referral for colonoscopy placed and flu shot given.

## 2011-08-06 NOTE — Patient Instructions (Addendum)
Decrease insulin dose to 16 units in the morning and 8 units.  Only give hydrochlorothiazide 12.5 mg once for blood pressure control.   If any of your lab results are abnormal, we will contact you by phone or send you a letter. If they are normal, we will not contact you, but will be happy to discuss them at your next clinic appointment.  Return to clinic to see Dr. Candy Sledge in 1 month  Please have Linda Lynn accompany the patient to the next visit so that we can care management issues more easily.  Please bring all your medications to your next clinic appointment.  Please bring glucose record and glucometer to next clinic appointment.

## 2011-08-06 NOTE — Assessment & Plan Note (Signed)
Linda Lynn 's sister again defers treating hyperlipidemia.

## 2011-08-06 NOTE — Assessment & Plan Note (Signed)
Again I am unsure what Ms. Amend's blood pressure regimen is. I will provide the specific instructions that she is only to receive HCTZ 12.5 mg daily. We will recheck blood pressure in one month. Nares age who was-appointment and could not wait to get BMP today. I will schedule a future lab draw for one week.

## 2011-08-06 NOTE — Assessment & Plan Note (Addendum)
I'm unsure what Linda Lynn's regimen is. Her aide, who supposedly takes care of her during the day does not know the medications or administrations. [name] sister seems to be her primary caregiver, though there seemed to be healthcare literacy issues there to when I talked to her over the phone.   A1c today is 5.9. I am concerned that there might be episodes of hypoglycemia with such a low A1c while on insulin. We have no records today to determine if there are episodes of hypoglycemia. Given this, I will provide instructions to decrease Novolin to 16 units in the morning and 8 units at night. She is not to receive any other insulin. We will have her return back in one month with specific instructions to bring her glucometer, glucose log book, as well as all medications. I will also request that her sister accompany the patient as the aide cannot give an accurate account of Linda Lynn's medications and cannot make healthcare decisions.

## 2011-08-19 ENCOUNTER — Telehealth: Payer: Self-pay | Admitting: Dietician

## 2011-08-19 NOTE — Telephone Encounter (Signed)
Called about eye exam. Patient has appointment today at 11:30 am- Dr Laruth Bouchard office will fax [place] the report as appropriate.  Will route to front office to call in 2 weeks to follow up

## 2011-08-27 LAB — URINALYSIS, ROUTINE W REFLEX MICROSCOPIC
Glucose, UA: NEGATIVE
Hgb urine dipstick: NEGATIVE
Protein, ur: NEGATIVE

## 2011-08-27 LAB — COMPREHENSIVE METABOLIC PANEL
ALT: 31
AST: 31
Albumin: 3.5
CO2: 27
Calcium: 8.6
Chloride: 102
GFR calc Af Amer: >60
GFR calc non Af Amer: 52 — ABNORMAL LOW
Sodium: 138

## 2011-08-27 LAB — URINE CULTURE: Culture: NO GROWTH

## 2011-08-27 LAB — CULTURE, BLOOD (ROUTINE X 2): Culture: NO GROWTH

## 2011-08-27 LAB — CBC
MCHC: 32.4
Platelets: 200
RBC: 4.8
WBC: 11.7 — ABNORMAL HIGH

## 2011-08-27 LAB — DIFFERENTIAL
Eosinophils Absolute: 0
Eosinophils Relative: 0
Lymphocytes Relative: 5 — ABNORMAL LOW
Lymphs Abs: 0.6 — ABNORMAL LOW
Monocytes Absolute: 0.3

## 2011-08-27 LAB — URINE MICROSCOPIC-ADD ON

## 2011-10-28 ENCOUNTER — Other Ambulatory Visit: Payer: Self-pay | Admitting: Internal Medicine

## 2011-11-06 NOTE — Progress Notes (Signed)
Addended by: Bufford Spikes on: 11/06/2011 03:06 PM   Modules accepted: Orders

## 2011-11-11 ENCOUNTER — Other Ambulatory Visit: Payer: Self-pay | Admitting: Internal Medicine

## 2011-11-12 ENCOUNTER — Other Ambulatory Visit: Payer: Self-pay | Admitting: Internal Medicine

## 2011-11-13 ENCOUNTER — Encounter (HOSPITAL_COMMUNITY): Payer: Self-pay

## 2011-11-13 ENCOUNTER — Emergency Department (INDEPENDENT_AMBULATORY_CARE_PROVIDER_SITE_OTHER): Payer: MEDICARE

## 2011-11-13 ENCOUNTER — Emergency Department (INDEPENDENT_AMBULATORY_CARE_PROVIDER_SITE_OTHER)
Admission: EM | Admit: 2011-11-13 | Discharge: 2011-11-13 | Disposition: A | Discharge status: Home / Self Care | Payer: MEDICARE | Source: Home / Self Care

## 2011-11-13 DIAGNOSIS — M25529 Pain in unspecified elbow: Secondary | ICD-10-CM

## 2011-11-13 DIAGNOSIS — M109 Gout, unspecified: Secondary | ICD-10-CM

## 2011-11-13 DIAGNOSIS — M25522 Pain in left elbow: Secondary | ICD-10-CM

## 2011-11-13 HISTORY — DX: Essential (primary) hypertension: I10

## 2011-11-13 MED ORDER — INDOMETHACIN 50 MG PO CAPS: 50.0000 mg | ORAL_CAPSULE | Freq: Three times a day (TID) | ORAL | Status: DC

## 2011-11-13 NOTE — ED Notes (Signed)
Caregivers state pt c/o pain in lt forearm for "a day or so".  Denies fall or injury.  Pt states it hurts from lt elbow to lt hand.  Caregivers state she wouldn't use lt arm earlier.

## 2011-11-13 NOTE — ED Notes (Signed)
No swelling or obvious deformity noted lt elbow, forearm or hand

## 2011-11-13 NOTE — ED Provider Notes (Signed)
History     CSN: [id number]  Arrival date & time 11/13/11  1039   None     Chief Complaint  Patient presents with  . Arm Pain    (Consider location/radiation/quality/duration/timing/severity/associated sxs/prior treatment) HPI Comments: Pt presents with her sister and caregiver. She began c/o pain Lt forearm yesterday. No known injury. This morning when she awoke she wouldn't use her arm, though is actively moving it now. She is Rt hand dominant. They deny any recent repetitive movement activities with upper extremities. She has a hx of gout. Mom states she has run out of some of her mediations one week ago including her gout medication. She is uncertain which gout medication she has run out of and states she called the PCP office one week ago but has not checked with pharmacy to see if rx has been sent in.   The history is provided by a caregiver and a relative. The history is limited by a developmental delay.    Past Medical History  Diagnosis Date  . Hypertension   . Diabetes mellitus   . Gout     History reviewed. No pertinent past surgical history.  History reviewed. No pertinent family history.  History  Substance Use Topics  . Smoking status: Never Smoker   . Smokeless tobacco: Never Used  . Alcohol Use: No    OB History    Grav Para Term Preterm Abortions TAB SAB Ect Mult Living                  Review of Systems  Constitutional: Negative for fever and chills.  Musculoskeletal: Negative for back pain.  Skin: Negative for color change, rash and wound.  Neurological: Negative for weakness.    Allergies  Review of patient's allergies indicates no known allergies.  Home Medications   Current Outpatient Rx  Name Route Sig Dispense Refill  . ALLOPURINOL 100 MG PO TABS  TAKE 1 TABLET BY MOUTH EVERY DAY ** DO NOT START MEDICATION UNTIL CURRENT GOUT FLARE IS RESOLVED 30 tablet 9  . COLCHICINE 0.6 MG PO TABS Oral Take 1 tablet (0.6 mg total) by mouth daily.  30 tablet 11  . HYDROCHLOROTHIAZIDE 12.5 MG PO TABS Oral Take 1 tablet (12.5 mg total) by mouth daily. 30 tablet 8  . HYDROCHLOROTHIAZIDE 25 MG PO TABS  TAKE 1 TABLET BY MOUTH ONCE A DAY 30 tablet 3  . INSULIN ISOPHANE & REGULAR (70-30) 100 UNIT/ML [place] SUSP  Inject 16 units subcutaneously in the morning and 8 units at night 10 mL 3  . INDOMETHACIN 50 MG PO CAPS Oral Take 1 capsule (50 mg total) by mouth 3 (three) times daily with meals. 30 capsule 0    BP 118/81  Pulse 91  Temp(Src) 98.1 F (36.7 C) (Oral)  Resp 18  SpO2 97%  LMP 03/02/2008  Physical Exam  Nursing note and vitals reviewed. Constitutional: She appears well-developed and well-nourished. No distress.  Cardiovascular: Normal rate, regular rhythm and normal heart sounds.   Pulmonary/Chest: Effort normal and breath sounds normal. No respiratory distress.  Musculoskeletal:       Left elbow: She exhibits decreased range of motion (Decreased flexion, and pt c/o pain with attempts to flex elbow . Full extension, internal and external rotation. ) and swelling. She exhibits no deformity and no laceration. tenderness found. Lateral epicondyle and olecranon process tenderness noted. No medial epicondyle tenderness noted.  Skin: Skin is warm and dry. No erythema.  Psychiatric: She has a normal  mood and affect.    ED Course  Procedures (including critical care time)   Labs Reviewed  URIC ACID   Dg Elbow Complete Left  11/13/2011  *RADIOLOGY REPORT*  Clinical Data: The patient is not moving her left arm.  LEFT ELBOW - COMPLETE 3+ VIEW  Comparison: None.  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 No appreciable joint effusion. Slight calcifications of the distal triceps insertion.  IMPRESSION: Mild arthritic changes.  No acute abnormality.  Original Report Authenticated By: Gwynn Burly, M.D.     1. Elbow pain, left   2. Gout       MDM  Lt elbow pain, mild swelling, and tenderness. Suspect  gout flare. May be due to lateral epicondylitis. Noncompliance with gout medication.  Xray neg for acute injury.        Melody Comas, [place] 11/13/11 1244

## 2011-11-16 ENCOUNTER — Telehealth (HOSPITAL_COMMUNITY): Payer: Self-pay | Admitting: *Deleted

## 2011-11-17 HISTORY — PX: COLONOSCOPY: SHX174

## 2011-11-17 NOTE — ED Provider Notes (Signed)
Medical screening examination/treatment/procedure(s) were performed by non-physician practitioner and as supervising physician I was immediately available for consultation/collaboration.   Barkley Bruns MD.    Barkley Bruns, MD 11/17/11 [phone]

## 2011-11-26 ENCOUNTER — Other Ambulatory Visit: Payer: Self-pay | Admitting: *Deleted

## 2011-11-30 MED ORDER — "INSULIN SYRINGE 31G X 5/16"" 0.3 ML MISC": Status: DC

## 2011-12-10 ENCOUNTER — Emergency Department (HOSPITAL_COMMUNITY): Payer: MEDICARE

## 2011-12-10 ENCOUNTER — Emergency Department (HOSPITAL_COMMUNITY)
Admission: EM | Admit: 2011-12-10 | Discharge: 2011-12-10 | Disposition: A | Discharge status: Home / Self Care | Payer: MEDICARE | Attending: Emergency Medicine | Admitting: Emergency Medicine

## 2011-12-10 DIAGNOSIS — R05 Cough: Secondary | ICD-10-CM | POA: Insufficient documentation

## 2011-12-10 DIAGNOSIS — R6889 Other general symptoms and signs: Secondary | ICD-10-CM | POA: Diagnosis not present

## 2011-12-10 DIAGNOSIS — R059 Cough, unspecified: Secondary | ICD-10-CM | POA: Diagnosis not present

## 2011-12-10 DIAGNOSIS — J069 Acute upper respiratory infection, unspecified: Secondary | ICD-10-CM | POA: Diagnosis not present

## 2011-12-10 DIAGNOSIS — R1110 Vomiting, unspecified: Secondary | ICD-10-CM | POA: Insufficient documentation

## 2011-12-10 DIAGNOSIS — I10 Essential (primary) hypertension: Secondary | ICD-10-CM | POA: Diagnosis not present

## 2011-12-10 DIAGNOSIS — J3489 Other specified disorders of nose and nasal sinuses: Secondary | ICD-10-CM | POA: Diagnosis not present

## 2011-12-10 DIAGNOSIS — Z794 Long term (current) use of insulin: Secondary | ICD-10-CM | POA: Insufficient documentation

## 2011-12-10 DIAGNOSIS — R51 Headache: Secondary | ICD-10-CM | POA: Diagnosis not present

## 2011-12-10 DIAGNOSIS — R0989 Other specified symptoms and signs involving the circulatory and respiratory systems: Secondary | ICD-10-CM

## 2011-12-10 DIAGNOSIS — E119 Type 2 diabetes mellitus without complications: Secondary | ICD-10-CM | POA: Diagnosis not present

## 2011-12-10 MED ORDER — OSELTAMIVIR PHOSPHATE 75 MG PO CAPS: 75.0000 mg | ORAL_CAPSULE | Freq: Two times a day (BID) | ORAL | Status: AC

## 2011-12-10 MED ORDER — ACETAMINOPHEN 325 MG PO TABS
650.0000 mg | ORAL_TABLET | Freq: Once | ORAL | Status: AC
  Administered 2011-12-10: 650 mg via ORAL
  Filled 2011-12-10: qty 2

## 2011-12-10 NOTE — ED Notes (Signed)
Pt vomited in triage. Cleaned up and gave her new gown and emesis bag

## 2011-12-10 NOTE — ED Notes (Signed)
Electronic signature would not. Patient discharged home with caregiver.

## 2011-12-10 NOTE — ED Notes (Addendum)
Patient' caregiver states patient started with nausea today at home and cold systems x 3-4 days.Caregiver states the patient started with chills and coughing after lunch today.  Denies fever and no vomiting until patient arrived to the hospital. Caregiver is concerned about possible seizure.

## 2011-12-10 NOTE — ED Provider Notes (Signed)
History     CSN: [id number]  Arrival date & time 12/10/11  1518   First MD Initiated Contact with Patient 12/10/11 1550      Chief Complaint  Patient presents with  . 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  Caregiver states pt had finished eating lunch approximately 10 minutes prior to episode.  She had a severe coughing episode, which was improved with water.  Caregiver thought that she was choking on something.  After episode resolved, patient was noted to be shivering.  On arrival she had a low grade temp.  Patient has also vomiting up "cold" once.  She has a mild cough in the ED and rhinorrhea.  Complains of associated headache and sinus pressure.    Patient is a 51 y.o. female presenting with URI.  URI The primary symptoms include headaches, cough and vomiting. Primary symptoms do not include fever, abdominal pain or nausea. The current episode started today. This is a new problem. The problem has not changed since onset. Symptoms associated with the illness include sinus pressure and rhinorrhea. The illness is not associated with congestion.    Past Medical History  Diagnosis Date  . Hypertension   . Diabetes mellitus   . Gout     No past surgical history on file.  No family history on file.  History  Substance Use Topics  . Smoking status: Never Smoker   . Smokeless tobacco: Never Used  . Alcohol Use: No    OB History    Grav Para Term Preterm Abortions TAB SAB Ect Mult Living                  Review of Systems  Constitutional: Negative for fever.  HENT: Positive for rhinorrhea and sinus pressure. Negative for congestion.   Respiratory: Positive for cough. Negative for shortness of breath.   Cardiovascular: Negative for chest pain.  Gastrointestinal: Positive for vomiting.  Negative for nausea, abdominal pain and diarrhea.  Genitourinary: Negative for difficulty urinating.  Neurological: Positive for headaches.  All other systems reviewed and are negative.    Allergies  Review of patient's allergies indicates no known allergies.  Home Medications   Current Outpatient Rx  Name Route Sig Dispense Refill  . ALLOPURINOL 100 MG PO TABS Oral Take 100 mg by mouth daily.     . COLCHICINE 0.6 MG PO TABS Oral Take 0.6 mg by mouth daily.    Marland Kitchen HYDROCHLOROTHIAZIDE 25 MG PO TABS Oral Take 25 mg by mouth daily.     . INDOMETHACIN 50 MG PO CAPS Oral Take 50 mg by mouth 3 (three) times daily with meals.    . INSULIN ISOPHANE & REGULAR (70-30) 100 UNIT/ML [place] SUSP Subcutaneous Inject 10-20 Units into the skin 2 (two) times daily. Inject 20 units subcutaneously in the morning and 10 units at night      BP 132/101  Pulse 112  Temp(Src) 100.1 F (37.8 C) (Oral)  Resp 20  Ht 5\' 5"  (1.651 m)  Wt 230 lb (104.327 kg)  BMI 38.27 kg/m2  SpO2 100%  LMP 03/02/2008  Physical Exam  Nursing note and vitals reviewed. Constitutional: She is oriented to person, place, and time. She appears well-developed and well-nourished. No distress.       Down's facies  HENT:  Head: Normocephalic and atraumatic.  Nose: Rhinorrhea present. Right sinus exhibits  maxillary sinus tenderness and frontal sinus tenderness. Left sinus exhibits maxillary sinus tenderness and frontal sinus tenderness.  Mouth/Throat: Oropharynx is clear and moist and mucous membranes are normal.  Eyes: No scleral icterus.       Left eye enucleated.  Neck: Neck supple. No tracheal deviation present.  Cardiovascular: Normal rate, regular rhythm, normal heart sounds and intact distal pulses.   No murmur heard. Pulmonary/Chest: Effort normal and breath sounds normal. No stridor. No respiratory distress. She has no wheezes. She has no rales.  Abdominal: Soft. Bowel sounds are normal. She exhibits no distension. There is  no tenderness.  Musculoskeletal: Normal range of motion.  Neurological: She is alert and oriented to person, place, and time.  Skin: Skin is warm and dry. No rash noted.  Psychiatric: She has a normal mood and affect. Her behavior is normal.    ED Course  Procedures (including critical care time)  Labs Reviewed - No data to display Dg Chest 2 View  12/10/2011  *RADIOLOGY REPORT*  Clinical Data: Of breath.  CHEST - 2 VIEW  Comparison: PA and lateral chest 08/26/2007.  Findings: Lungs are clear.  Heart size is mildly enlarged.  No pneumothorax or effusion.  No focal bony abnormality.  IMPRESSION: Mild cardiomegaly.  No acute disease.  Original Report Authenticated By: Bernadene Bell. Maricela Curet, M.D.  All radiology studies independently viewed by me.      1. Choking episode   2. Acute URI       MDM  51 yo female with hx of Down's Syndrome presenting after a choking episode.  Single episode after eating, resolved with water, no syncope.  Well appearing on arrival.  No stridor.  No distress.  Also has URI symptoms and low grade fever on arrival.  Mildly tachy, which is likely secondary to fever. Well appearing and does not appear ill or dehydrated.  Screening CXR pending.   CXR negative.  Will dc home with caregiver.       Warnell Forester, MD 12/11/11 0002

## 2011-12-10 NOTE — ED Notes (Signed)
Pt caregiver brought her in because she was coughing "like she was chocking".  Caregiver gave her some water and the symptoms resolved and but she had some shivering and her caregiver was concerned and wanted her to be evaluated.  Pt has a cold (with congestion).  No fever or chills.  Pt reports that she has been sick "with a cold" recently.  No distress in ED

## 2011-12-11 NOTE — ED Provider Notes (Signed)
I saw and evaluated the patient, reviewed the resident's note and I agree with the findings and plan. Pt with choking episode that resolved after drinking water.  Passed swallow screen here and no distress.  Normal breath sounds and normal CXR.  She does have low grade temp and uri sx and given hx and medical problems will give tamiflu.  Gwyneth Sprout, MD 12/11/11 1120

## 2012-01-19 ENCOUNTER — Ambulatory Visit (INDEPENDENT_AMBULATORY_CARE_PROVIDER_SITE_OTHER): Payer: Medicare Other | Admitting: Internal Medicine

## 2012-01-19 ENCOUNTER — Other Ambulatory Visit: Payer: Self-pay | Admitting: Internal Medicine

## 2012-01-19 ENCOUNTER — Encounter: Payer: Self-pay | Admitting: Internal Medicine

## 2012-01-19 VITALS — BP 130/74 | HR 78 | Temp 97.2°F | Ht <= 58 in | Wt 165.0 lb

## 2012-01-19 DIAGNOSIS — E785 Hyperlipidemia, unspecified: Secondary | ICD-10-CM | POA: Diagnosis not present

## 2012-01-19 DIAGNOSIS — E119 Type 2 diabetes mellitus without complications: Secondary | ICD-10-CM | POA: Diagnosis not present

## 2012-01-19 DIAGNOSIS — M109 Gout, unspecified: Secondary | ICD-10-CM

## 2012-01-19 DIAGNOSIS — Z79899 Other long term (current) drug therapy: Secondary | ICD-10-CM | POA: Diagnosis not present

## 2012-01-19 LAB — LIPID PANEL
Cholesterol: 212 mg/dL — ABNORMAL HIGH (ref 0–200)
Total CHOL/HDL Ratio: 3.2 Ratio
Triglycerides: 63 mg/dL (ref <150)
VLDL: 13 mg/dL (ref 0–40)

## 2012-01-19 LAB — BASIC METABOLIC PANEL WITH GFR
BUN: 42 mg/dL — ABNORMAL HIGH (ref 6–23)
Calcium: 9.4 mg/dL (ref 8.4–10.5)
Creat: 1.19 mg/dL — ABNORMAL HIGH (ref 0.50–1.10)
GFR, Est African American: 61 mL/min
Glucose, Bld: 81 mg/dL (ref 70–99)

## 2012-01-19 MED ORDER — INSULIN NPH ISOPHANE & REGULAR (70-30) 100 UNIT/ML ~~LOC~~ SUSP: 10.0000 [IU] | Freq: Two times a day (BID) | SUBCUTANEOUS | Status: DC

## 2012-01-19 MED ORDER — COLCHICINE 0.6 MG PO TABS: 0.6000 mg | ORAL_TABLET | Freq: Every day | ORAL | Status: DC

## 2012-01-19 MED ORDER — HYDROCHLOROTHIAZIDE 25 MG PO TABS: 25.0000 mg | ORAL_TABLET | Freq: Every day | ORAL | Status: DC

## 2012-01-19 MED ORDER — "INSULIN SYRINGE-NEEDLE U-100 30G X 1/2"" 1 ML MISC": Status: DC

## 2012-01-19 MED ORDER — FEBUXOSTAT 40 MG PO TABS: 80.0000 mg | ORAL_TABLET | Freq: Every day | ORAL | Status: DC

## 2012-01-19 NOTE — Progress Notes (Signed)
  Subjective:    Patient ID: Linda Lynn, female    DOB: 05/22/1961, 51 y.o.   MRN: [id number]  HPI  Patient is here today for a regular follow up. She has DM which is well controlled at this time. She has gout and had a recent episode of gout flare which was treated with indomethacine.  No other complaints today.   Review of Systems  Constitutional: Negative for fever, activity change and appetite change.  HENT: Negative for sore throat.   Respiratory: Negative for cough and shortness of breath.   Cardiovascular: Negative for chest pain and leg swelling.  Gastrointestinal: Negative for nausea, abdominal pain, diarrhea, constipation and abdominal distention.  Genitourinary: Negative for frequency, hematuria and difficulty urinating.  Neurological: Negative for dizziness and headaches.  Psychiatric/Behavioral: Negative for suicidal ideas and behavioral problems.       Objective:   Physical Exam  Constitutional: She is oriented to person, place, and time. She appears well-developed and well-nourished.  HENT:  Head: Normocephalic and atraumatic.  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  Neck: Normal range of motion. Neck supple. No JVD present. No thyromegaly present.  Cardiovascular: Normal rate, regular rhythm and normal heart sounds.  Exam reveals no gallop and no friction rub.   No murmur heard.      Intact distal pulses on foot exam  Pulmonary/Chest: Effort normal and breath sounds normal. No respiratory distress. She has no wheezes. She has no rales.  Abdominal: Soft. Bowel sounds are normal. She exhibits no distension and no mass. There is no tenderness. There is no rebound and no guarding.  Musculoskeletal: Normal range of motion. She exhibits no edema and no tenderness.  Lymphadenopathy:    She has no cervical adenopathy.  Neurological: She is alert and oriented to person, place, and time.  Psychiatric: She has a normal mood and affect. Her  behavior is normal.          Assessment & Plan:

## 2012-01-19 NOTE — Assessment & Plan Note (Signed)
Will keep the same regimen for now and have her come back in 3 months.

## 2012-01-19 NOTE — Assessment & Plan Note (Signed)
Patient had an episode of gout a month ago on allopurinol. Her serum uric acid was high 8.5 in December.  We may consider switching her regimen to febuxistat for hyperuricemia. I will have her come back in 2 weeks and check serum uric acid levels at that time. If uric acid levels are still >6 will increase the dose to 80 mg a day. Will start at 40 mg a day and stop allopurinol at this time as it is ineffective in preventing the gout attacks and decreasing the uric acid levels.

## 2012-01-19 NOTE — Patient Instructions (Signed)
Gout  Gout is caused by a buildup of uric acid crystals in the joints. The crystals make your joints sore. This is like having sand in your joints. Repeat attacks are common. Gout can be treated.  HOME CARE     Do not take aspirin for pain.    Only take medicine as told by your doctor.    You may use cold treatments (ice) on painful joints.    Put ice in a plastic bag.    Place a towel between your skin and the bag.    Leave the ice on for 15 to 20 minutes at a time, 3 to 4 times a day.    Rest in bed as much as possible. When in bed, keep the sheets and blankets off your sore joints.    Keep the sore joints raised (elevated).    Use crutches if your legs or ankles hurt.    Drink enough water and fluids to keep your pee (urine) clear or pale yellow. This helps your body get rid of uric acid. Do not drink alcohol.    Follow diet instructions as told by your doctor.    Keep your body at a healthy weight.   GET HELP RIGHT AWAY IF:     You have a temperature by mouth above 102 F (38.9 C), not controlled by medicine.    You have watery poop (diarrhea).    You are throwing up (vomiting).    You do not feel better in 1 day, or you are getting worse.    Your joint hurts more.    You have the chills.   MAKE SURE YOU:     Understand these instructions.    Will watch your condition.    Will get help right away if you are not doing well or get worse.   Document Released: 08/11/2008 Document Revised: 10/22/2011 Document Reviewed: 02/10/2010  [employer] Patient Information 2012 [employer].

## 2012-01-20 ENCOUNTER — Other Ambulatory Visit: Payer: Self-pay | Admitting: Internal Medicine

## 2012-01-20 NOTE — Telephone Encounter (Signed)
This evidently was an empty request

## 2012-02-02 ENCOUNTER — Encounter: Payer: Medicaid Other | Admitting: Internal Medicine

## 2012-02-04 ENCOUNTER — Telehealth: Payer: Self-pay | Admitting: Licensed Clinical Social Worker

## 2012-02-04 ENCOUNTER — Ambulatory Visit (INDEPENDENT_AMBULATORY_CARE_PROVIDER_SITE_OTHER): Payer: Medicare Other | Admitting: Internal Medicine

## 2012-02-04 ENCOUNTER — Encounter: Payer: Self-pay | Admitting: Internal Medicine

## 2012-02-04 VITALS — BP 100/70 | HR 84 | Temp 97.0°F | Ht <= 58 in | Wt 163.0 lb

## 2012-02-04 DIAGNOSIS — M109 Gout, unspecified: Secondary | ICD-10-CM | POA: Diagnosis not present

## 2012-02-04 DIAGNOSIS — I10 Essential (primary) hypertension: Secondary | ICD-10-CM | POA: Diagnosis not present

## 2012-02-04 DIAGNOSIS — T7401XA Adult neglect or abandonment, confirmed, initial encounter: Secondary | ICD-10-CM

## 2012-02-04 DIAGNOSIS — E119 Type 2 diabetes mellitus without complications: Secondary | ICD-10-CM | POA: Diagnosis not present

## 2012-02-04 LAB — COMPLETE METABOLIC PANEL WITH GFR
BUN: 34 mg/dL — ABNORMAL HIGH (ref 6–23)
CO2: 29 mEq/L (ref 19–32)
Calcium: 9.1 mg/dL (ref 8.4–10.5)
Chloride: 104 mEq/L (ref 96–112)
Creat: 1.3 mg/dL — ABNORMAL HIGH (ref 0.50–1.10)
GFR, Est African American: 55 mL/min — ABNORMAL LOW

## 2012-02-04 NOTE — Patient Instructions (Signed)
Please bring Linda Lynn back next week with all of her medication bottles and blood sugar meter.  This is important! [name] may be having low blood sugars; low blood sugar can be life threatening. Do not hold her food if her blood sugars are high.  It is important that she eat even if her blood sugar is over 300. [name] has been prescribed a cream for her feet.  Please help her apply the lotion once a day, every day.  It is important that her entire foot be covered with the lotion. [name] needs to see a podiatrist for evaluation and treatment of her foot callous.  Diabetic patients can easily develop foot infections that can travel to the bone and cause bone infection.  Bone infections are hard to treat and often require amputation (removal of the limb).
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
## 2012-02-04 NOTE — Progress Notes (Signed)
Subjective:     Patient ID: Linda Lynn, female   DOB: 1961/06/29, 51 y.o.   MRN: [id number]  HPI  Patient is her today for a follow up visit.  A home health aide is present with the patient today; states she has cared for the patient for the past 3 years.  Pt was recently treated for an acute gout flare and transitioned from allopurinol to Uloric.  She is here today for repeat uric acid level; this was above goal at 8.5 in 10/2011.  Patient notes the pain in her elbow is now gone.  The aide does not know if she has completed her course of indomethacin nor can she confirm if patient has stopped allopurinol is colchicine and/or uloric.  DM: patient is currently prescribed 20u of insulin in the am and 10 at night per med list on EPIC.  At her last OV, aide was instructed to decrease insling to 16u in the am and 8u at night for concerns about hypoglycemia.  The aide does not know how much insulin the patient takes and states she "thinks 15 units."  Pt was last seen in 07/2011; HH-aide present at that appt was informed that patient was  to return for 1 month f/u for BP check and BMET in 08/2011 - this did not occur.  Aide states she checks pts CBGs and does not administer insulin.  She reports CBGs of 101-108 for pre-prandial measurements.  Aide states she will hold patient's food if CBGs are high; and highest readings have been 117.  Aid denies any CBGs < 70.    Aide states sister unable to come to office visit today as a result of "not feeling well."  Per chart review, there have been a few requests for the sister to attend office visits; this has not occurred on multiple occasions.   At her last OV, patient was referred for colonoscopy.  It does not seem this was completed.  The patient does has extremely limited insight into her medical illnesses; she cannot tell me the names of any medications or what medical conditions she takes medicine for.  She is able to tell me she has a "meter."  Review of  Systems  Constitutional: Negative for fever, chills, diaphoresis, activity change, appetite change, fatigue and unexpected weight change.  HENT: Negative for hearing loss, congestion and neck stiffness.   Eyes: Negative for photophobia, pain and visual disturbance.  Respiratory: Negative for cough, chest tightness, shortness of breath and wheezing.   Cardiovascular: Negative for chest pain and palpitations.  Gastrointestinal: Negative for abdominal pain, blood in stool and anal bleeding.  Genitourinary: Negative for dysuria, hematuria and difficulty urinating.  Musculoskeletal: Negative for joint swelling.  Neurological: Negative for dizziness, syncope, speech difficulty, weakness, numbness and headaches.      Objective:   Physical Exam Vital signs review. GEN: Patient appears disheveled.  No acute distress  Alert and oriented to self and place.  Able to follow simple commands only.  Speech is occasionally mumbled. Pleasant and cooperative to exam. HEENT: head is autraumatic and normocephalic.  Neck is supple without palpable masses or lymphadenopathy.  No JVD or carotid bruits.   Right eye is cloudy, milky white with no pupil visualized.  Left eye is shut and does not open.  Blindness noted. Mucous membranes are moist.  Oropharynx is without erythema, exudates, or other abnormal lesions.  Dentition is poor. RESP:  Lungs are clear to ascultation bilaterally with good air movement.  No wheezes,  ronchi, or rubs. CARDIOVASCULAR: regular rate, normal rhythm.  Clear S1, S2, no murmurs, gallops, or rubs. ABDOMEN: soft, non-tender, non-distended.  Bowels sounds present in all quadrants and normoactive.  No palpable masses. EXT: warm and dry.  Peripheral pulses equal, intact, and +2 globally.  No clubbing or cyanosis.  Trace edema in bilateral lower extremities. SKIN: warm and dry with normal turgor.  No rashes or abnormal lesions observed. NEURO: speech is fluent but stunted.  Comprehension is  impaired; patient is able to appropriately answer simply yes/no questions but is unable to appropriately respond to more complicated questions.  She is easiluy distractable but also perseverates on specific topics (ie birthdays)     Assessment/Plan:

## 2012-02-05 LAB — MICROALBUMIN / CREATININE URINE RATIO
Creatinine, Urine: 77.5 mg/dL
Microalb, Ur: 0.5 mg/dL (ref 0.00–1.89)

## 2012-02-08 NOTE — Assessment & Plan Note (Signed)
Will check a uric acid level today to assess response to her uloric.  Hopefully, she will be at goal with a uric acid level < 6.5.  Will also check a metabolic panel to assess renal function following treatment with high dose NSAIDS; unclear it patient is still on therapy.  I have asked the aide to return in 1 week with the patient and all of her medications.
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
## 2012-02-09 ENCOUNTER — Telehealth: Payer: Self-pay | Admitting: Licensed Clinical Social Worker
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
## 2012-02-10 ENCOUNTER — Encounter: Payer: Self-pay | Admitting: Internal Medicine

## 2012-02-10 ENCOUNTER — Ambulatory Visit (INDEPENDENT_AMBULATORY_CARE_PROVIDER_SITE_OTHER): Payer: MEDICARE | Admitting: Internal Medicine

## 2012-02-10 VITALS — BP 114/80 | HR 79 | Temp 97.0°F | Ht <= 58 in | Wt 163.9 lb

## 2012-02-10 DIAGNOSIS — I10 Essential (primary) hypertension: Secondary | ICD-10-CM | POA: Diagnosis not present

## 2012-02-10 DIAGNOSIS — Z1231 Encounter for screening mammogram for malignant neoplasm of breast: Secondary | ICD-10-CM

## 2012-02-10 DIAGNOSIS — Z1211 Encounter for screening for malignant neoplasm of colon: Secondary | ICD-10-CM | POA: Diagnosis not present

## 2012-02-10 DIAGNOSIS — M109 Gout, unspecified: Secondary | ICD-10-CM | POA: Diagnosis not present

## 2012-02-10 DIAGNOSIS — Z1239 Encounter for other screening for malignant neoplasm of breast: Secondary | ICD-10-CM | POA: Diagnosis not present

## 2012-02-10 DIAGNOSIS — E119 Type 2 diabetes mellitus without complications: Secondary | ICD-10-CM | POA: Diagnosis not present

## 2012-02-10 MED ORDER — HYDROCHLOROTHIAZIDE 25 MG PO TABS: 12.5000 mg | ORAL_TABLET | Freq: Every day | ORAL | Status: DC

## 2012-02-10 MED ORDER — INSULIN NPH ISOPHANE & REGULAR (70-30) 100 UNIT/ML ~~LOC~~ SUSP: SUBCUTANEOUS | Status: DC

## 2012-02-10 NOTE — Progress Notes (Signed)
Patient ID: Linda Lynn, female   DOB: November 27, 1960, 51 y.o.   MRN: [id number]  Subjective:     Patient ID: Linda Lynn, female   DOB: 19-Dec-1960, 51 y.o.   MRN: [id number]  HPI  Patient is her today for a 1 week follow up visit to discuss management of patients chronic medical conditions.  Aide and sister are present with patient.  DM: at last OV, aide was instructed to bring meter with patient to OV; this was also written on instruction sheet sent home with the patient.  Meter is not with patient today.  Sister states CBGs average in the 100s; denies any lows below 70 or CBGs greater than 120.  Sister reports that she is administering 20u of insulin in the morning and 10units at night.  When I inquire about the change made by Dr. Candy Sledge in 07/2011 to decrease insulin to 16u in the am and 8 at night; pts sister states she was unaware of change.  When confronted about the documented phone call with Dr. Candy Sledge, sister denies receiving any instructions to change insulin.  Gout: patient has been receiving both allopurinol and uloric.  Her recent episode of acute gout remains resolved.  No additional attacks per pt, aide, or sister.  Review of Systems  Constitutional: Negative for fever, chills, diaphoresis, activity change, appetite change, fatigue and unexpected weight change.  HENT: Negative for hearing loss, congestion and neck stiffness.   Eyes: Negative for photophobia, pain and visual disturbance.  Respiratory: Negative for cough, chest tightness, shortness of breath and wheezing.   Cardiovascular: Negative for chest pain and palpitations.  Gastrointestinal: Negative for abdominal pain, blood in stool and anal bleeding.  Genitourinary: Negative for dysuria, hematuria and difficulty urinating.  Musculoskeletal: Negative for joint swelling.  Neurological: Negative for dizziness, syncope, speech difficulty, weakness, numbness and headaches.      Objective:   Physical Exam Vital signs  reviewed. GEN:  No acute distress  Alert and oriented to self and place.  Able to follow simple commands only.  Speech is occasionally mumbled. Pleasant and cooperative to exam. HEENT: head is autraumatic and normocephalic. Right eye is cloudy, milky white with no pupil visualized.  Left eye is shut and does not open.  Blindness noted. RESP:  Lungs are clear to ascultation bilaterally with good air movement.  No wheezes, ronchi, or rubs.    Assessment/Plan:

## 2012-02-10 NOTE — Assessment & Plan Note (Addendum)
Expressed concerns about possible hypoglycemia and educated pts sister and aide about dangers of hypoglycemia at length.  Sister and aide instructed to decrease pts insulin to 15u QAM and 5u QHS.  Both sister and a educated about the importance of regular CBG checks in plan for obtaining fasting, pre-, and postprandial measurements regularly over the next 2 weeks. This was described in detail to the aide and the sister with instructions to record the measurements, time, and pre-or postprandial status of CBGs.  Aide and sister agreed to this and are able to explain the plan in detail back to me. They're advised to followup in 2 weeks.  I remain very concerned that the patient is not receiving appropriate care at home from her sister and home aide.  They are strongly encouraged to return for followup visit in 2 weeks if this is in to grow for the treatment of patient's diabetes and incredibly important to insure she is not experiencing life-threatening hypoglycemia.

## 2012-02-10 NOTE — Progress Notes (Signed)
Sister aware of appt [facility] mammogram 03/11/12 10:15AM. Stanton Kidney Angala Hilgers RN 02/10/12 12:10PM
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
## 2012-02-10 NOTE — Patient Instructions (Signed)
Schedule a follow up visit in 2 weeks to discuss blood pressure and diabetes. Decrease insulin to 15 units in the morning and 5 units in the evening.   Over the next 2 weeks, check blood sugar in the morning before breakfast and then before and after each meal. Write these numbers down and bring the meter and log to the next office visit. The blood pressure pill was decreased to one-half of a tablet daily. Bring all medicine to the next office visit. We will help you set up a mammogram and colonoscopy today.

## 2012-02-11 ENCOUNTER — Telehealth: Payer: Self-pay | Admitting: *Deleted

## 2012-02-11 NOTE — Telephone Encounter (Signed)
Call from [employer], owner of [employer], stating patient and aide were in to clinic yesterday.  The aide reported to her boss that the doctor seem dissatisfied with the care pt was receiving. Tamika called asking for verification and to see what the problem was. I talked with Dr Arvilla Market and she stated she addressed the changes she was recommending in the patients discharge summary.  I informed Tamika of this.

## 2012-02-11 NOTE — Telephone Encounter (Signed)
At the last 2 office visits, Ms. Schofield aide was given verbal and written instructions regarding plan for continued care.

## 2012-02-12 ENCOUNTER — Other Ambulatory Visit: Payer: Self-pay | Admitting: Internal Medicine

## 2012-02-24 ENCOUNTER — Ambulatory Visit (INDEPENDENT_AMBULATORY_CARE_PROVIDER_SITE_OTHER): Payer: MEDICARE | Admitting: Internal Medicine

## 2012-02-24 ENCOUNTER — Encounter: Payer: Self-pay | Admitting: Internal Medicine

## 2012-02-24 VITALS — BP 113/74 | HR 83 | Temp 97.0°F | Wt 165.5 lb

## 2012-02-24 DIAGNOSIS — E119 Type 2 diabetes mellitus without complications: Secondary | ICD-10-CM | POA: Diagnosis not present

## 2012-02-24 DIAGNOSIS — N179 Acute kidney failure, unspecified: Secondary | ICD-10-CM | POA: Insufficient documentation

## 2012-02-24 DIAGNOSIS — M109 Gout, unspecified: Secondary | ICD-10-CM

## 2012-02-24 DIAGNOSIS — E785 Hyperlipidemia, unspecified: Secondary | ICD-10-CM

## 2012-02-24 LAB — BASIC METABOLIC PANEL
BUN: 36 mg/dL — ABNORMAL HIGH (ref 6–23)
Chloride: 107 mEq/L (ref 96–112)
Creat: 1.16 mg/dL — ABNORMAL HIGH (ref 0.50–1.10)
Glucose, Bld: 96 mg/dL (ref 70–99)

## 2012-02-24 NOTE — Assessment & Plan Note (Signed)
No flare recently Uric acid less than 5 at last check Patient is on Febuxostat

## 2012-02-24 NOTE — Assessment & Plan Note (Signed)
Creatinine was going up at last visit check Patient does not have proteinuria based on urine protein to creatinine ratio done at last visit Increasing creatinine may be from dehydration and diuretics I did check it again today. If it's worsening I will ask the patient to stop taking the diuretic for next 2 weeks and see her back in 2 weeks

## 2012-02-24 NOTE — Assessment & Plan Note (Signed)
Meter readings show CBG in 100-200 range. There is just one hypoglycemic event of less than 50. Patient's sister is aware of this hypoglycemic event and she gave her peanut butter at that time Patient is eating 3 meals a day and snacks in between. Her weight has been consistent over the past one year Patient denies any symptoms of hypoglycemia I will continue her insulin regimen of 70/30 15 and 5 Followup with PCP in one to 2 months

## 2012-02-24 NOTE — Progress Notes (Signed)
Patient ID: Linda Lynn, female   DOB: 08-29-1961, 51 y.o.   MRN: [id number]  51 Y/o W with pmh listed below comes for checkup No complaints today Complaint with medications No medications side effect Uptodate on refills See individual a/p for further details.   Physical exam  General Appearance:     Filed Vitals:   02/24/12 1036  BP: 113/74  Pulse: 83  Temp: 97 F (36.1 C)  TempSrc: Oral  Weight: 165 lb 8 oz (75.07 kg)   Gen.: Patient has Down syndrome  and facies, communication consistent with the diagnoses   Lungs:     Clear to auscultation bilaterally, respirations unlabored  Chest wall:    No tenderness or deformity  Heart:    Regular rate and rhythm, S1 and S2 normal, no murmur, rub   or gallop  Abdomen:     Soft, non-tender, bowel sounds active all four quadrants,    no masses, no organomegaly  Extremities:   Extremities normal, atraumatic, no cyanosis or edema  Pulses:   2+ and symmetric all extremities  Skin:   Skin color, texture, turgor normal, no rashes or lesions  Neurologic:  nonfocal grossly    ROS  Constitutional: Denies fever, chills, diaphoresis, appetite change and fatigue.  Respiratory: Denies SOB, DOE, cough, chest tightness,  and wheezing.   Cardiovascular: Denies chest pain, palpitations and leg swelling.  Gastrointestinal: Denies nausea, vomiting, abdominal pain, diarrhea, constipation, blood in stool and abdominal distention.  Skin: Denies pallor, rash and wound.  Neurological: Denies dizziness, light-headedness, numbness and headaches.

## 2012-02-24 NOTE — Assessment & Plan Note (Signed)
Currently not on any statin. as per previous notes from the PCP the family member has refused treatment for for the patient Again I will defer this to the PCP to start statin at next followup visit

## 2012-03-10 ENCOUNTER — Ambulatory Visit (AMBULATORY_SURGERY_CENTER): Payer: MEDICARE | Admitting: *Deleted

## 2012-03-10 VITALS — Ht <= 58 in | Wt 155.0 lb

## 2012-03-10 DIAGNOSIS — Z1211 Encounter for screening for malignant neoplasm of colon: Secondary | ICD-10-CM

## 2012-03-10 MED ORDER — PEG-KCL-NACL-NASULF-NA ASC-C 100 G PO SOLR: ORAL | Status: DC

## 2012-03-11 ENCOUNTER — Ambulatory Visit (HOSPITAL_COMMUNITY): Admission: RE | Admit: 2012-03-11 | Payer: Medicare Other | Source: Ambulatory Visit

## 2012-03-17 NOTE — Assessment & Plan Note (Addendum)
Sister and aide are not giving patient medications as directed per her last office visit with Dr. Robley Fries.  They have been giving her a full tablet of the hydrochlorothiazide. Instructed the aide had a sister to give only one half of a tablet daily. They are instructed to bring the patient back for a followup visit in 2 weeks for repeat blood pressure check and to obtain a basic metabolic panel to assess her electrolyte status and renal function.

## 2012-03-17 NOTE — Assessment & Plan Note (Addendum)
On review of medications it appears that sister has been giving the patient allopurinol, colchicine, and uloric for management of her gout.  Sister was informed that the patient's regimen had previously been changed he'll work this is the only medication she could currently be receiving on a daily basis for management of her gout.

## 2012-03-24 ENCOUNTER — Ambulatory Visit (AMBULATORY_SURGERY_CENTER): Payer: MEDICARE | Admitting: Internal Medicine

## 2012-03-24 ENCOUNTER — Encounter: Payer: Self-pay | Admitting: Internal Medicine

## 2012-03-24 VITALS — BP 90/63 | HR 59 | Temp 96.7°F | Resp 13 | Ht <= 58 in | Wt 155.0 lb

## 2012-03-24 DIAGNOSIS — Z1211 Encounter for screening for malignant neoplasm of colon: Secondary | ICD-10-CM

## 2012-03-24 LAB — GLUCOSE, CAPILLARY
Glucose-Capillary: 76 mg/dL (ref 70–99)
Glucose-Capillary: 91 mg/dL (ref 70–99)

## 2012-03-24 MED ORDER — DEXTROSE 5 % IV SOLN: INTRAVENOUS | Status: DC

## 2012-03-24 NOTE — Op Note (Signed)
[facility] [address] [place], [place]  [address]  COLONOSCOPY PROCEDURE REPORT  PATIENT:  Linda, Lynn  MR#:  [id number] BIRTHDATE:  March 09, 1961, 51 yrs. old  GENDER:  female ENDOSCOPIST:  Iva Boop, MD, [facility] REF. BY:          Alphonzo Severance, MD PROCEDURE DATE:  03/24/2012 PROCEDURE:  Average-risk screening colonoscopy G0121 ASA CLASS:  Class III INDICATIONS:  Routine Risk Screening MEDICATIONS:   These medications were titrated to patient response per physician's verbal order, Fentanyl 50 mcg IV, Versed 4 mg IV  DESCRIPTION OF PROCEDURE:   After the risks benefits and alternatives of the procedure were thoroughly explained, informed consent was obtained.  Digital rectal exam was performed and revealed no abnormalities.   The LB CF-[id number] endoscope was introduced through the anus and advanced to the cecum, which was identified by both the appendix and ileocecal valve, without limitations.  The quality of the prep was adequate, using MoviPrep.  The instrument was then slowly withdrawn as the colon was fully examined. <<PROCEDUREIMAGES>>  FINDINGS:  A normal appearing cecum, ileocecal valve, and appendiceal orifice were identified. The ascending, hepatic flexure, transverse, splenic flexure, descending, sigmoid colon, and rectum appeared unremarkable.   Retroflexed views in the rectum revealed no abnormalities.    The time to cecum = 5:44 minutes. The scope was then withdrawn in 9:04 minutes from the cecum and the procedure completed. COMPLICATIONS:  None ENDOSCOPIC IMPRESSION: 1) Normal colonoscopy, adequate prep  REPEAT EXAM:  In 10 year(s) for routine screening colonoscopy.  Iva Boop, MD, [facility]  CC:  Alphonzo Severance, MD and The Patient  n. eSIGNED:   Iva Boop at 03/24/2012 11:56 AM  [name], [phone]

## 2012-03-24 NOTE — Patient Instructions (Signed)
YOU HAD AN ENDOSCOPIC PROCEDURE TODAY AT THE [facility]: Refer to the procedure report that was given to you for any specific questions about what was found during the examination.  If the procedure report does not answer your questions, please call your gastroenterologist to clarify.  If you requested that your care partner not be given the details of your procedure findings, then the procedure report has been included in a sealed envelope for you to review at your convenience later.  YOU SHOULD EXPECT: Some feelings of bloating in the abdomen. Passage of more gas than usual.  Walking can help get rid of the air that was put into your GI tract during the procedure and reduce the bloating. If you had a lower endoscopy (such as a colonoscopy or flexible sigmoidoscopy) you may notice spotting of blood in your stool or on the toilet paper. If you underwent a bowel prep for your procedure, then you may not have a normal bowel movement for a few days.  DIET: Your first meal following the procedure should be a light meal and then it is ok to progress to your normal diet.  A half-sandwich or bowl of soup is an example of a good first meal.  Heavy or fried foods are harder to digest and may make you feel nauseous or bloated.  Likewise meals heavy in dairy and vegetables can cause extra gas to form and this can also increase the bloating.  Drink plenty of fluids but you should avoid alcoholic beverages for 24 hours.  ACTIVITY: Your care partner should take you home directly after the procedure.  You should plan to take it easy, moving slowly for the rest of the day.  You can resume normal activity the day after the procedure however you should NOT DRIVE or use heavy machinery for 24 hours (because of the sedation medicines used during the test).    SYMPTOMS TO REPORT IMMEDIATELY: A gastroenterologist can be reached at any hour.  During normal business hours, 8:30 AM to 5:00 PM Monday through Friday,  call [phone].  After hours and on weekends, please call the GI answering service at [phone] who will take a message and have the physician on call contact you.   Following lower endoscopy (colonoscopy or flexible sigmoidoscopy):  Excessive amounts of blood in the stool  Significant tenderness or worsening of abdominal pains  Swelling of the abdomen that is new, acute  Fever of 100F or higher  Following upper endoscopy (EGD)  Vomiting of blood or coffee ground material  New chest pain or pain under the shoulder blades  Painful or persistently difficult swallowing  New shortness of breath  Fever of 100F or higher  Black, tarry-looking stools  FOLLOW UP: If any biopsies were taken you will be contacted by phone or by letter within the next 1-3 weeks.  Call your gastroenterologist if you have not heard about the biopsies in 3 weeks.  Our staff will call the home number listed on your records the next business day following your procedure to check on you and address any questions or concerns that you may have at that time regarding the information given to you following your procedure. This is a courtesy call and so if there is no answer at the home number and we have not heard from you through the emergency physician on call, we will assume that you have returned to your regular daily activities without incident.  SIGNATURES/CONFIDENTIALITY: You and/or your care   partner have signed paperwork which will be entered into your electronic medical record.  These signatures attest to the fact that that the information above on your After Visit Summary has been reviewed and is understood.  Full responsibility of the confidentiality of this discharge information lies with you and/or your care-partner.  

## 2012-03-24 NOTE — Progress Notes (Signed)
Patient did not have preoperative order for IV antibiotic SSI prophylaxis. (G8918)  Patient did not experience any of the following events: a burn prior to discharge; a fall within the facility; wrong site/side/patient/procedure/implant event; or a hospital transfer or hospital admission upon discharge from the facility. (G8907)  

## 2012-03-25 ENCOUNTER — Telehealth: Payer: Self-pay

## 2012-03-25 NOTE — Telephone Encounter (Signed)
Left message on answering machine. 

## 2012-03-28 LAB — GLUCOSE, CAPILLARY

## 2012-03-31 ENCOUNTER — Encounter: Payer: Self-pay | Admitting: Internal Medicine

## 2012-04-06 ENCOUNTER — Ambulatory Visit (HOSPITAL_COMMUNITY): Payer: MEDICARE | Attending: Internal Medicine

## 2012-04-07 ENCOUNTER — Other Ambulatory Visit: Payer: Self-pay | Admitting: Internal Medicine

## 2012-04-18 DIAGNOSIS — I739 Peripheral vascular disease, unspecified: Secondary | ICD-10-CM | POA: Diagnosis not present

## 2012-04-18 DIAGNOSIS — L608 Other nail disorders: Secondary | ICD-10-CM | POA: Diagnosis not present

## 2012-04-18 DIAGNOSIS — L84 Corns and callosities: Secondary | ICD-10-CM | POA: Diagnosis not present

## 2012-04-18 DIAGNOSIS — E1059 Type 1 diabetes mellitus with other circulatory complications: Secondary | ICD-10-CM | POA: Diagnosis not present

## 2012-04-20 ENCOUNTER — Encounter: Payer: Self-pay | Admitting: Internal Medicine

## 2012-04-20 DIAGNOSIS — E1169 Type 2 diabetes mellitus with other specified complication: Secondary | ICD-10-CM

## 2012-04-20 DIAGNOSIS — E785 Hyperlipidemia, unspecified: Secondary | ICD-10-CM | POA: Insufficient documentation

## 2012-05-04 ENCOUNTER — Encounter: Payer: Self-pay | Admitting: Internal Medicine

## 2012-05-04 ENCOUNTER — Ambulatory Visit (INDEPENDENT_AMBULATORY_CARE_PROVIDER_SITE_OTHER): Payer: MEDICARE | Admitting: Internal Medicine

## 2012-05-04 VITALS — BP 107/66 | HR 78 | Temp 97.1°F | Ht <= 58 in | Wt 161.5 lb

## 2012-05-04 DIAGNOSIS — I10 Essential (primary) hypertension: Secondary | ICD-10-CM | POA: Diagnosis not present

## 2012-05-04 DIAGNOSIS — E785 Hyperlipidemia, unspecified: Secondary | ICD-10-CM | POA: Diagnosis not present

## 2012-05-04 DIAGNOSIS — E119 Type 2 diabetes mellitus without complications: Secondary | ICD-10-CM

## 2012-05-04 DIAGNOSIS — T7401XA Adult neglect or abandonment, confirmed, initial encounter: Secondary | ICD-10-CM | POA: Diagnosis not present

## 2012-05-04 DIAGNOSIS — M109 Gout, unspecified: Secondary | ICD-10-CM | POA: Diagnosis not present

## 2012-05-04 MED ORDER — METFORMIN HCL 1000 MG PO TABS: 1000.0000 mg | ORAL_TABLET | Freq: Two times a day (BID) | ORAL | Status: DC

## 2012-05-04 NOTE — Patient Instructions (Addendum)
Discontinue hydrochlorothiazide (HCTZ). Please also discontinue insulin therapy. Begin metformin with the following instructions: Week 1: take one half tablet (500 mg), with a meal in the evening Week 2: take one half tablet (500 mg), with a meal in the morning and in the evening  Week 3: take one half tablet (500 mg), with a meal in the morning and one full tablet (1000 mg) in the evening with meal Week 4: take one tablet (1000 mg), with a meal in the morning and in the evening  Please check blood glucose once in the morning before she has had anything to eat. We will have you return in 2 weeks and we will download the readings from the glucometer to determine if metformin is working.  Return to clinic to see your new PCP in 2 weeks. We will also have you meet with our dietitian/diabetes educator, Norm Parcel.  Please bring all your medications and your glucometer to your next clinic appointment.

## 2012-05-04 NOTE — Assessment & Plan Note (Signed)
Stable on febuxostat only without recent flare. Will continue without modification.

## 2012-05-04 NOTE — Assessment & Plan Note (Addendum)
I still have concerns regarding the support surrounding Ms. Linda Lynn. Her sister clearly does not have a good understanding of Ms. Linda Lynn current plasma glucose values. It does appear as though she has been getting her medications as ordered, but her glucose monitoring has clearly been inadequate. I expressed these concerns to her sister and told her that I expect to see at least 14 readings on the glucometer on different days at her followup visit in 2 weeks. In the future as another provider will be taking over the management of Ms. Linda Lynn, I would recommend explicit written instructions be provided for all important patient interactions.

## 2012-05-04 NOTE — Assessment & Plan Note (Addendum)
Patient's goal LDL is less than 100. LDL is currently 133 as of March. Statin will need to be started. However, since we are already starting metformin at this visit, I am reluctant to start another medication. This reluctance is twofold. First, I want to keep the medication changes as simple as possible so that Linda Lynn's caregivers do not become confused. Second, the patient will likely experience some side effects with initiation of metformin and I would like to be able to separate any intolerable side effects between the statin, which needs to be started, and metformin. We will plan on the patient starting pravastatin at her followup visit in 2 weeks.

## 2012-05-04 NOTE — Assessment & Plan Note (Signed)
Compliance appears to be better with antihypertensive therapy. Linda Lynn sister has been giving her one half tablet (12.5 g) of HCTZ as she is supposed to. Overall blood pressure is on the low side today. I question whether Linda Lynn requires any antihypertensive therapy. As we have her coming back in 2 weeks, I suggested that we discontinue HCTZ and recheck blood pressure in 2 weeks. If she is hypertensive it will only have been a short while. Optimally lisinopril should be restarted in place of HCTZ for whatever renal protection it may provide.

## 2012-05-04 NOTE — Assessment & Plan Note (Addendum)
Meter readings show good plasma glucose control, however overall monitoring compliance is poor. No evidence of overt hypoglycemia, but hemoglobin A1c is decreased from 3 months ago indicating the patient is still over treated as she was hypoglycemic on some of her readings at that time. Again confirmed patient was eating and not receiving insulin if she does not eat. As she is requiring very small doses of insulin and she has not been tried on metformin at least according to her sister or our medical records ([facility] and [facility]), we will discontinue insulin and initiate metformin therapy. Hopefully this will decrease episodes of hypoglycemia and still provide adequate blood sugar control. We will also help Linda Lynn not be stuck with needles. We will have patient start 500 mg of metformin with supper x1 week. Patient will then on week 2 take 500 mg twice a day with food. On week 3, patient will take 500 mg with breakfast and 1000 mg with dinner. On week 4 and thereafter patient will take 1000 mg twice a day with food. We will have the patient followup with her new PCP in 2 weeks to ensure compliance on the part are her caregivers and to make sure that the patient is not becoming hyperglycemic with cessation of insulin. We will also have Linda Lynn and her sister Linda Lynn visit are diabetes educator at that time. Explicit instructions regarding this plan were provided to the patient's sister, Linda Lynn.
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
## 2012-05-06 ENCOUNTER — Telehealth: Payer: Self-pay | Admitting: *Deleted

## 2012-05-06 NOTE — Telephone Encounter (Signed)
Needs CBG test strips -will call 05/09/12 with info what type strips pt needs. Also changing pharmacy - pharmacy told pt she had to call to request  strips.  Stanton Kidney Dawnelle Warman RN 05/06/12 4:25PM

## 2012-05-10 ENCOUNTER — Other Ambulatory Visit: Payer: Self-pay | Admitting: *Deleted

## 2012-05-10 DIAGNOSIS — E119 Type 2 diabetes mellitus without complications: Secondary | ICD-10-CM

## 2012-05-10 MED ORDER — GLUCOSE BLOOD VI STRP: ORAL_STRIP | Status: DC

## 2012-05-13 ENCOUNTER — Other Ambulatory Visit: Payer: Self-pay | Admitting: *Deleted

## 2012-05-13 DIAGNOSIS — E119 Type 2 diabetes mellitus without complications: Secondary | ICD-10-CM

## 2012-05-13 MED ORDER — GLUCOSE BLOOD VI STRP: ORAL_STRIP | Status: DC

## 2012-05-13 MED ORDER — LANCETS 30G MISC: Status: DC

## 2012-05-18 ENCOUNTER — Ambulatory Visit (INDEPENDENT_AMBULATORY_CARE_PROVIDER_SITE_OTHER): Payer: MEDICARE | Admitting: Dietician

## 2012-05-18 ENCOUNTER — Encounter: Payer: Self-pay | Admitting: Internal Medicine

## 2012-05-18 ENCOUNTER — Ambulatory Visit (INDEPENDENT_AMBULATORY_CARE_PROVIDER_SITE_OTHER): Payer: MEDICARE | Admitting: Internal Medicine

## 2012-05-18 VITALS — BP 110/76 | HR 93 | Temp 97.1°F | Ht <= 58 in | Wt 162.6 lb

## 2012-05-18 DIAGNOSIS — I10 Essential (primary) hypertension: Secondary | ICD-10-CM | POA: Diagnosis not present

## 2012-05-18 DIAGNOSIS — M109 Gout, unspecified: Secondary | ICD-10-CM

## 2012-05-18 DIAGNOSIS — E119 Type 2 diabetes mellitus without complications: Secondary | ICD-10-CM | POA: Diagnosis not present

## 2012-05-18 DIAGNOSIS — Z0000 Encounter for general adult medical examination without abnormal findings: Secondary | ICD-10-CM

## 2012-05-18 DIAGNOSIS — Z8679 Personal history of other diseases of the circulatory system: Secondary | ICD-10-CM

## 2012-05-18 DIAGNOSIS — E785 Hyperlipidemia, unspecified: Secondary | ICD-10-CM | POA: Diagnosis not present

## 2012-05-18 LAB — GLUCOSE, CAPILLARY: Glucose-Capillary: 125 mg/dL — ABNORMAL HIGH (ref 70–99)

## 2012-05-18 MED ORDER — PRAVASTATIN SODIUM 40 MG PO TABS: 40.0000 mg | ORAL_TABLET | Freq: Every evening | ORAL | Status: DC

## 2012-05-18 MED ORDER — COLCHICINE 0.6 MG PO TABS: 0.6000 mg | ORAL_TABLET | Freq: Every day | ORAL | Status: DC

## 2012-05-18 MED ORDER — FEBUXOSTAT 40 MG PO TABS: 40.0000 mg | ORAL_TABLET | Freq: Every day | ORAL | Status: DC

## 2012-05-18 MED ORDER — METFORMIN HCL 500 MG PO TABS: 500.0000 mg | ORAL_TABLET | Freq: Two times a day (BID) | ORAL | Status: DC

## 2012-05-18 NOTE — Assessment & Plan Note (Signed)
Pt needed Rx refills Colchine 0.6 and Uloric 40 mg Rx today No active flares currently

## 2012-05-18 NOTE — Assessment & Plan Note (Signed)
-  BP today 110/76 at goal  -Unknown if patient needs to be on antihypertensive due to previous BP readings  -will stop HCTZ 6.25 mg qd and reassess BP at future appts since pt BP is currently controlled -Consider ACEI (low dose) in the future if hypertensive

## 2012-05-18 NOTE — Progress Notes (Signed)
  Subjective:    Patient ID: Linda Lynn, female    DOB: 04-03-1961, 51 y.o.   MRN: [id number]  HPI Comments: Linda Lynn is a 51 y.o woman with down syndrome here for f/u for DM, HTN, and HLD.  She has been taking Metformin 500 mg  qam and 500 mg q pm since last visit and tolerating.  She has also been taking HCTZ 6.25 mg qd.  She has no complaints today and is accompanied by her sister [name] who she lives with and who provides care and she as well does not have complaints.  History and ROS provided by the sister due to patients mental capacity.  FH: HTN, DM     Review of Systems  Constitutional: Negative for appetite change.  Respiratory: Negative for shortness of breath.   Cardiovascular: Negative for leg swelling.  Genitourinary: Negative for difficulty urinating.       Objective:   Physical Exam  Nursing note and vitals reviewed. Constitutional: Vital signs are normal. She appears well-developed and well-nourished. No distress.  HENT:  Head: Normocephalic and atraumatic.  Eyes: Conjunctivae are normal.    Cardiovascular: Normal rate, regular rhythm and normal heart sounds.  Exam reveals no gallop and no friction rub.   No murmur heard. Pulmonary/Chest: Effort normal and breath sounds normal. She has no wheezes.  Abdominal: Soft. Normal appearance and bowel sounds are normal. There is no tenderness.    Neurological: She is alert.  Skin: Skin is warm, dry and intact. She is not diaphoretic.  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

## 2012-05-18 NOTE — Assessment & Plan Note (Signed)
-  Patient needs pneumococcal and flu vx at f/u in 3-4 months from 05/18/12 visit

## 2012-05-18 NOTE — Progress Notes (Signed)
Patient unable to stay for visit with RD, CDE.

## 2012-05-18 NOTE — Assessment & Plan Note (Addendum)
-  Most recent HA1C 6.1 stable will repeat at f/u -Will continue pt on Metformin 500 mg bid with meals (1 pill w/ breakfast, 1 pill w/dinner) and will NOT increase dose currently -Reviewed glucose meter reading this visit  -recheck HA1C at next visit -pt has eye MD appt 06/2012-Dr. Katrina Stack

## 2012-05-18 NOTE — Progress Notes (Signed)
Spoke with [facility] [address] Pt's caregiver is picking up Rx from the pharmacy for Metformin and previously picked up HCTZ which we discontinued today Shirlee Latch [phone]

## 2012-05-18 NOTE — Assessment & Plan Note (Signed)
-  LDL 133 not at goal <100 -will start Pravastatin 40 mg qhs today -recheck lipids at f/u 3-4 months

## 2012-05-19 NOTE — Progress Notes (Signed)
I saw, examined, and discussed the patient with Dr Shirlee Latch and agree with the note contained here. Dr Shirlee Latch reviewed all past BP's and we were unable to find evidence to support need of antihypertensive. Since med compliance has been a concerned in past (but seem to be doing great in regards to DM control now), decreasing the pill burden would be extremely beneficial in Linda Lynn. If BP increasing off HCTZ, ACEI would be first line therapy as pt is diabetic.

## 2012-06-08 ENCOUNTER — Ambulatory Visit (INDEPENDENT_AMBULATORY_CARE_PROVIDER_SITE_OTHER): Payer: MEDICARE | Admitting: Dietician

## 2012-06-08 VITALS — Ht <= 58 in | Wt 162.7 lb

## 2012-06-08 DIAGNOSIS — E119 Type 2 diabetes mellitus without complications: Secondary | ICD-10-CM

## 2012-06-08 NOTE — Patient Instructions (Addendum)
Your LDL cholesterol is too high. It should be <100 and it is 133.   You can lower your cholesterol by eating less fat, greasy and fried foods.   It also help to eat more fruits, vegetables in fiber like whole grain bread, macaroni, pasta and cereal, brown rice and baked potatoes/sweet potatyoes with skin on.   Instead of diet soda use green tea with 1/2 tsp sugar.  Instead 2% Milk use 1% milk. Low fat cheese instead of regular.   Can try [place] burgers or [place] meatloaf.   Barley instead of rice.   Make a follow up in 4- 6 week.  We;ll follow her weight and review foods she is eating.

## 2012-06-10 ENCOUNTER — Telehealth: Payer: Self-pay | Admitting: *Deleted

## 2012-06-10 NOTE — Progress Notes (Signed)
Medical Nutrition Therapy:  Appt start time: 1100 end time:  1200.  Assessment:  Primary concerns today: Meal planning.  Patient here with sister, [name] who is her main caretaker. Blood sugar and blood pressure well controlled on minimal medications. Has not started on stain yet for  LDL 143, total cholesterol 212. BMI 44 which his stable x several years Usual eating pattern includes 3 meals and 2 snacks per day. Usual physical activity includes walks with aide 30 minutes a few times a week*. Everyday foods include diet coke.  Avoided foods include sugar.   24-hr recall: B - instant flavored oatmeal, 1/2 banana, 1 cup 2% milk    L - salad, fruit, oodles of noodles, diet coke  Snk - fruit  D - meatloaf, cornbread, greens, diet coke  Snk - thought she had to eat a bedtime snack to keep her sugar up while sleeping  Progress Towards Goal(s):  In progress.   Nutritional Diagnosis:  Martin-2.2 Altered nutrition-related laboratory As related to risk of heart disease with increased cholesterol and goal for LDL with diabetes < 100 mg./dl  As evidenced by her LDL > 100mg /dl.    Intervention:   1-Nutrition education about risks of high LDL cholesterol and need for low fat and healthy fat, high fiber diet along with statin . 2- Coordination of care- has not gotten statin  Rx yet. will request triage nurse to call in as it appears to have been printed.  3- Encouraged patient weight to be closer to 150-155# (5-10 # gradual weight loss. )  Monitoring/Evaluation:  Dietary intake, exercise, blood sugars, and body weight in 4-5 week(s).

## 2012-06-10 NOTE — Telephone Encounter (Signed)
Pravastatin 40mg  rx called to [facility].

## 2012-06-16 ENCOUNTER — Encounter: Payer: Self-pay | Admitting: Internal Medicine

## 2012-06-16 ENCOUNTER — Ambulatory Visit (INDEPENDENT_AMBULATORY_CARE_PROVIDER_SITE_OTHER): Payer: MEDICARE | Admitting: Internal Medicine

## 2012-06-16 VITALS — BP 130/74 | HR 84 | Temp 98.1°F | Ht <= 58 in | Wt 163.3 lb

## 2012-06-16 DIAGNOSIS — E119 Type 2 diabetes mellitus without complications: Secondary | ICD-10-CM

## 2012-06-16 DIAGNOSIS — N179 Acute kidney failure, unspecified: Secondary | ICD-10-CM

## 2012-06-16 DIAGNOSIS — N139 Obstructive and reflux uropathy, unspecified: Secondary | ICD-10-CM | POA: Diagnosis not present

## 2012-06-16 DIAGNOSIS — I10 Essential (primary) hypertension: Secondary | ICD-10-CM

## 2012-06-16 DIAGNOSIS — N3000 Acute cystitis without hematuria: Secondary | ICD-10-CM | POA: Diagnosis not present

## 2012-06-16 DIAGNOSIS — N390 Urinary tract infection, site not specified: Secondary | ICD-10-CM | POA: Insufficient documentation

## 2012-06-16 LAB — GLUCOSE, CAPILLARY: Glucose-Capillary: 73 mg/dL (ref 70–99)

## 2012-06-16 MED ORDER — CIPROFLOXACIN HCL 500 MG PO TABS: 500.0000 mg | ORAL_TABLET | Freq: Two times a day (BID) | ORAL | Status: AC

## 2012-06-16 MED ORDER — PRAVASTATIN SODIUM 40 MG PO TABS: 40.0000 mg | ORAL_TABLET | Freq: Every evening | ORAL | Status: DC

## 2012-06-16 NOTE — Patient Instructions (Addendum)
1. Will collect UA and urine culture 2. Will treat you with Cipro 500 mg po twice daily x 7 days 3. Please call back if your symptoms are not better in one week.

## 2012-06-16 NOTE — Progress Notes (Signed)
Subjective:    Subjective:   Patient ID: Linda Lynn female   DOB: 1961/09/03 51 y.o.   MRN: [id number] HPI Linda Lynn is a 51 y.o woman with PMH of down syndrome, DM, HTN, and HLD, who presents with her caregiver for follow up visit.   1. Dysuria   Patient reports urinary odor and dysuria for last 2 days. Patient is unable to tell whether she has had urinary frequency or urgency or not. She also noticed mid lower back pain x 2-3 days. No Radiation or sciatic pain. Denies any injury or trauma. Patient sister reports one episode of fever at 51 F yesterday which was relieved by Tylenol.   2. DM type II. HGB A1C 6.1 in June 2013.      She is doing well. No hypoglycemic episode. Her meter is reviewed at clinic today. Her CBG range at 120's with lowest reading at 68 and highest at 202 post prandial. Her CBG is 73 without symptoms at clinic today and she is provided OJ.     She has been taking Metformin 500 mg BID and tolerated well.   3.  HTN  she has been off antihypertensive medications since last visit and she is doling well. Her BP is well controlled.  Past Medical History  Diagnosis Date  . Hypertension   . Diabetes mellitus   . Gout   . 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 Hyperlipidemia    Current Outpatient Prescriptions  Medication Sig Dispense Refill  . colchicine 0.6 MG tablet Take 1 tablet (0.6 mg total) by mouth daily.  30 tablet  4  . febuxostat (ULORIC) 40 MG tablet Take 1 tablet (40 mg total) by mouth daily.  30 tablet  1  . glucose blood test strip - check blood sugar 4 times per day. Code 250.00  150 each  12  . Insulin Syringe-Needle U-100 30G X 1/2" 1 ML MISC Use as directed  30 each  11  . Lancets 30G MISC check blood sugar 4 times per day. Code 250.00  200 each  12  . metFORMIN (GLUCOPHAGE) 500 MG tablet Take 1 tablet (500 mg total) by mouth 2 (two) times daily with a meal.  60 tablet  4  . pravastatin (PRAVACHOL) 40 MG  tablet Take 1 tablet (40 mg total) by mouth every evening.  30 tablet  6  . DISCONTD: pravastatin (PRAVACHOL) 40 MG tablet Take 1 tablet (40 mg total) by mouth every evening.  30 tablet  6  . ciprofloxacin (CIPRO) 500 MG tablet Take 1 tablet (500 mg total) by mouth 2 (two) times daily.  14 tablet  0   Family History  Problem Relation Age of Onset  . Colon cancer Cousin   . Diabetes Sister   . Hypertension Sister   . Diabetes Sister   . Hypertension Sister    History   Social History  . Marital Status: Single    Spouse Name: N/A    Number of Children: N/A  . Years of Education: N/A   Social History Main Topics  . Smoking status: Never Smoker   . Smokeless tobacco: Never Used  . Alcohol Use: No  . Drug Use: No  . Sexually Active: None   Other Topics Concern  . None   Social History Narrative  . None   Review of Systems: Review of Systems:  Constitutional:  Positive for  fever, chills,denies  diaphoresis, appetite change and fatigue.   HEENT:  Denies congestion, sore throat, rhinorrhea, sneezing, mouth sores, trouble swallowing, neck pain   Respiratory:  Denies SOB, DOE, cough, and wheezing.   Cardiovascular:  Denies palpitations and leg swelling.   Gastrointestinal:  Denies nausea, vomiting, abdominal pain, diarrhea, constipation, blood in stool and abdominal distention.   Genitourinary:  Positive for dysuria, denies urgency, frequency, hematuria, flank pain and difficulty urinating.   Musculoskeletal:  Denies myalgias, back pain, joint swelling, arthralgias and gait problem.   Skin:  Denies pallor, rash and wound.   Neurological:  Denies dizziness, seizures, syncope, weakness, light-headedness, numbness and headaches.    .   Objective:  Physical Exam: Filed Vitals:   06/16/12 1324  BP: 130/74  Pulse: 84  Temp: 98.1 F (36.7 C)  TempSrc: Oral  Height: 4' 8.5" (1.435 m)  Weight: 163 lb 4.8 oz (74.072 kg)  SpO2: 100%  General: alert, well-developed, and  cooperative to examination.  Head: normocephalic and atraumatic.  Eyes: vision grossly intact, pupils equal, pupils round, pupils reactive to light, no injection and anicteric.  Mouth: pharynx pink and moist, no erythema, and no exudates.  Neck: supple, full ROM, no thyromegaly, no JVD, and no carotid bruits.  Lungs: normal respiratory effort, no accessory muscle use, normal breath sounds, no crackles, and no wheezes. Heart: normal rate, regular rhythm, no murmur, no gallop, and no rub.  Abdomen: soft, non-tender, normal bowel sounds, no distention, no guarding, no rebound tenderness, no hepatomegaly, and no splenomegaly.  Msk: no joint swelling, no joint warmth, and no redness over joints. Negative for CTA tenderness.  Pulses: 2+ DP/PT pulses bilaterally Extremities: No cyanosis, clubbing, edema. No paraspinal tenderness noted.  SLRT Negative Neurologic: alert & oriented X3, cranial nerves II-XII intact, strength normal in all extremities, sensation intact to light touch, and gait normal.  Skin: turgor normal and no rashes.  Psych: Oriented X3, down syndrome.   Assessment & Plan:

## 2012-06-17 ENCOUNTER — Other Ambulatory Visit: Payer: Self-pay | Admitting: Internal Medicine

## 2012-06-17 LAB — URINALYSIS, ROUTINE W REFLEX MICROSCOPIC
Bilirubin Urine: NEGATIVE
Glucose, UA: NEGATIVE mg/dL
Leukocytes, UA: NEGATIVE
Protein, ur: NEGATIVE mg/dL
Specific Gravity, Urine: 1.015 (ref 1.005–1.030)
pH: 5.5 (ref 5.0–8.0)

## 2012-06-17 LAB — BASIC METABOLIC PANEL WITH GFR
CO2: 29 mEq/L (ref 19–32)
Chloride: 103 mEq/L (ref 96–112)
Glucose, Bld: 87 mg/dL (ref 70–99)
Potassium: 3.9 mEq/L (ref 3.5–5.3)
Sodium: 141 mEq/L (ref 135–145)

## 2012-06-17 NOTE — Assessment & Plan Note (Signed)
Patient presented with urinary odor and dysuria for 2 days.  Unclear about urinary frequency and urgency due to her down syndrome. Also reports one episode of fever at 90 F per her sister. Patient states mid lower back pain but denies CTA tenderness on exam. However, unsure how reliable her answers will be given her history of down syndrome.   - Urine dipstick is negative for UTI at the office - Given her clinical symptoms, will recollect her urine for UA and culture, and will empirically treat her with UTI pending UA and culture results.

## 2012-06-17 NOTE — Assessment & Plan Note (Signed)
Well controlled since she is off her medications

## 2012-06-17 NOTE — Assessment & Plan Note (Signed)
Well controlled.  - HGB A1C due in Sep, 2013 - continue current regimen

## 2012-06-18 LAB — URINE CULTURE: Colony Count: >=100000

## 2012-06-29 NOTE — Telephone Encounter (Signed)
Pls sch appt with Dr Lonzo Cloud about Nov. Routine F/U

## 2012-07-07 DIAGNOSIS — I739 Peripheral vascular disease, unspecified: Secondary | ICD-10-CM | POA: Diagnosis not present

## 2012-07-07 DIAGNOSIS — M2010 Hallux valgus (acquired), unspecified foot: Secondary | ICD-10-CM | POA: Diagnosis not present

## 2012-07-07 DIAGNOSIS — E1059 Type 1 diabetes mellitus with other circulatory complications: Secondary | ICD-10-CM | POA: Diagnosis not present

## 2012-07-07 DIAGNOSIS — L84 Corns and callosities: Secondary | ICD-10-CM | POA: Diagnosis not present

## 2012-07-07 DIAGNOSIS — L608 Other nail disorders: Secondary | ICD-10-CM | POA: Diagnosis not present

## 2012-07-12 ENCOUNTER — Ambulatory Visit: Payer: MEDICARE | Admitting: Dietician

## 2012-09-19 DIAGNOSIS — L608 Other nail disorders: Secondary | ICD-10-CM | POA: Diagnosis not present

## 2012-09-19 DIAGNOSIS — I739 Peripheral vascular disease, unspecified: Secondary | ICD-10-CM | POA: Diagnosis not present

## 2012-09-19 DIAGNOSIS — L84 Corns and callosities: Secondary | ICD-10-CM | POA: Diagnosis not present

## 2012-09-19 DIAGNOSIS — E1059 Type 1 diabetes mellitus with other circulatory complications: Secondary | ICD-10-CM | POA: Diagnosis not present

## 2012-10-25 ENCOUNTER — Other Ambulatory Visit: Payer: Self-pay | Admitting: Dietician

## 2012-10-25 DIAGNOSIS — E119 Type 2 diabetes mellitus without complications: Secondary | ICD-10-CM

## 2012-10-25 NOTE — Telephone Encounter (Signed)
All Freestyle strips recalled in past few days- patient needs new meter and supply rxs for Medicare to cover.  Patient is due for an appointment also- have sent message to fronto office to schedule.

## 2012-10-28 MED ORDER — GLUCOSE BLOOD VI STRP: ORAL_STRIP | Status: DC

## 2012-10-28 MED ORDER — ONETOUCH ULTRA MINI W/DEVICE KIT: PACK | Status: DC

## 2012-10-28 MED ORDER — ONETOUCH DELICA LANCETS FINE MISC: 1.0000 | Freq: Four times a day (QID) | Status: DC

## 2012-12-06 ENCOUNTER — Other Ambulatory Visit: Payer: Self-pay | Admitting: Internal Medicine

## 2012-12-06 NOTE — Telephone Encounter (Signed)
This medication was stopped in June of 2013 (please see note, Dr. Candy Sledge).  On 2 subsequent visits the medication was not mentioned.  If she has been continuing this medication, she will need to be seen before continuation as it was intended to be stopped.   Thanks!

## 2012-12-06 NOTE — Telephone Encounter (Signed)
Pt was called; stated she is not taking Novolin 70/30.

## 2012-12-22 DIAGNOSIS — E1059 Type 1 diabetes mellitus with other circulatory complications: Secondary | ICD-10-CM | POA: Diagnosis not present

## 2012-12-22 DIAGNOSIS — L608 Other nail disorders: Secondary | ICD-10-CM | POA: Diagnosis not present

## 2012-12-22 DIAGNOSIS — I739 Peripheral vascular disease, unspecified: Secondary | ICD-10-CM | POA: Diagnosis not present

## 2012-12-22 DIAGNOSIS — L84 Corns and callosities: Secondary | ICD-10-CM | POA: Diagnosis not present

## 2013-01-26 ENCOUNTER — Other Ambulatory Visit: Payer: Self-pay | Admitting: Internal Medicine

## 2013-01-31 ENCOUNTER — Encounter: Payer: Self-pay | Admitting: Internal Medicine

## 2013-02-28 ENCOUNTER — Encounter: Payer: Self-pay | Admitting: Internal Medicine

## 2013-02-28 ENCOUNTER — Ambulatory Visit (INDEPENDENT_AMBULATORY_CARE_PROVIDER_SITE_OTHER): Payer: MEDICARE | Admitting: Internal Medicine

## 2013-02-28 VITALS — BP 112/77 | HR 80 | Temp 97.5°F | Ht <= 58 in | Wt 144.7 lb

## 2013-02-28 DIAGNOSIS — E785 Hyperlipidemia, unspecified: Secondary | ICD-10-CM | POA: Diagnosis not present

## 2013-02-28 DIAGNOSIS — E119 Type 2 diabetes mellitus without complications: Secondary | ICD-10-CM

## 2013-02-28 DIAGNOSIS — Z0000 Encounter for general adult medical examination without abnormal findings: Secondary | ICD-10-CM | POA: Diagnosis not present

## 2013-02-28 DIAGNOSIS — Z23 Encounter for immunization: Secondary | ICD-10-CM

## 2013-02-28 DIAGNOSIS — Q909 Down syndrome, unspecified: Secondary | ICD-10-CM | POA: Diagnosis not present

## 2013-02-28 DIAGNOSIS — M109 Gout, unspecified: Secondary | ICD-10-CM | POA: Diagnosis not present

## 2013-02-28 LAB — CBC WITH DIFFERENTIAL/PLATELET
Eosinophils Absolute: 0.1 10*3/uL (ref 0.0–0.7)
Eosinophils Relative: 1 % (ref 0–5)
HCT: 37.6 % (ref 36.0–46.0)
Hemoglobin: 12.3 g/dL (ref 12.0–15.0)
Lymphocytes Relative: 50 % — ABNORMAL HIGH (ref 12–46)
Lymphs Abs: 2.9 10*3/uL (ref 0.7–4.0)
MCH: 28.4 pg (ref 26.0–34.0)
MCV: 86.8 fL (ref 78.0–100.0)
Monocytes Absolute: 0.3 10*3/uL (ref 0.1–1.0)
Monocytes Relative: 5 % (ref 3–12)
Platelets: 239 10*3/uL (ref 150–400)
RBC: 4.33 MIL/uL (ref 3.87–5.11)
WBC: 5.8 10*3/uL (ref 4.0–10.5)

## 2013-02-28 LAB — COMPLETE METABOLIC PANEL WITH GFR
ALT: 12 U/L (ref 0–35)
CO2: 29 mEq/L (ref 19–32)
Calcium: 9.5 mg/dL (ref 8.4–10.5)
Chloride: 101 mEq/L (ref 96–112)
Creat: 0.99 mg/dL (ref 0.50–1.10)
GFR, Est African American: 76 mL/min
GFR, Est Non African American: 66 mL/min
Glucose, Bld: 119 mg/dL — ABNORMAL HIGH (ref 70–99)

## 2013-02-28 LAB — LIPID PANEL
LDL Cholesterol: 69 mg/dL (ref 0–99)
Triglycerides: 71 mg/dL (ref <150)

## 2013-02-28 LAB — URIC ACID: Uric Acid, Serum: 1.5 mg/dL — ABNORMAL LOW (ref 2.4–7.0)

## 2013-02-28 NOTE — Progress Notes (Signed)
Subjective:     Patient ID: Linda Lynn, female   DOB: 1960-12-14, 52 y.o.   MRN: [id number]  HPI This 52 year old african american lady with down's syndrome comes in accompanied by her sister every visit. I am seeing her for the first time. She is a 52-year-old diabetic on metformin and has gout for which she takes cochicine daily. The patient does not make an intelligent conversation, so sister Linda Lynn gives history. She denies any complaints at this time.    Diabetes - Takes metformin 500 mg BID. Checks sugars twice a day. Has brought glucometer. Says that since starting metformin she has seen her sister lose weight. Denies any leg pain, or cramps at night in legs experienced by sister. She has eye and foot doctors and she visits them regularly. 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 No foot wounds.   Gout - Had an attack 1 month ago, but says she is fine now. She is taking colchicine 0.6 mg once daily as prophylaxis.   Weight loss -  About 20 lbs from 8/13 to today, 4/15.  Review of Systems She denies any chest pain, palpitations, falls, syncope, dizziness at this time.  She denies any abnormal urinary or bowel symptoms.     Objective:   Physical Exam  Constitutional: She is oriented to person, place, and time. She appears well-developed and well-nourished.  HENT:  Head: Normocephalic.  Eyes:  Both eyes abnormal, no vision.   Cardiovascular: Normal rate and regular rhythm.  Exam reveals no gallop.   Murmur (systolic) heard. Pulmonary/Chest: Effort normal and breath sounds normal. No respiratory distress. She has no wheezes.  Normal breast palpation. No axillary lymphadenopathy.   Abdominal: Soft.  Neurological: She is alert and oriented to person, place, and time. She has normal reflexes.  Feet - no wounds, palpable pulses.   Skin: Skin is warm.      Assessment & Plan:     1. Diabetes, controlled on metformin - The patient's glucometer log shows one reading of FBS as  80 and one of 68. Else, 80 % of her readings are within target. The patient takes dinner at 630 and her medicine while sleeping, and does not take a night time snack. I reviewed diabetic meal planning with her, and asked sister Linda Lynn to give her a night time snack. We will get foot and eye exam notes from her doctors.   Lab Results  Component Value Date   HGBA1C 6.0 02/28/2013   HGBA1C 6.1 05/04/2012   HGBA1C 6.2 01/19/2012   Lab Results  Component Value Date   MICROALBUR 0.50 02/04/2012   LDLCALC 133* 01/19/2012   CREATININE 0.87 06/16/2012   2. Dyslipidemia - We will get blood work for lipids today. Patient on Pravastatin and takes it regularly. Denies myalgias.    Lab Results  Component Value Date   CHOL 212* 01/19/2012   HDL 66 01/19/2012   LDLCALC 409* 01/19/2012   TRIG 63 01/19/2012   CHOLHDL 3.2 01/19/2012   3. Gout - The patient stopped taking her uloric for reasons not clear at this time. I will check uric acid today. She is on colchicine daily.  4. Weight loss - 20 lbs in 8 months is significant. Colonoscopy in 2013 was normal, Mammogram has never been done, always cancelled because the patient does not like it, on manual palpation I do not feel any masses. This could be due to metformin and eating less de to retardation. There are  no other overt symptoms. However, I would follow this up in the 3 month visit. I have scheduled another mammogram and I will review her blood work.    5. Preventive Measures   Vaccines  Flu vaccine: Not done this season.  Tetanus/Tdap: uptodate  Pneumovax : today  Zostavax: Not done so far. Not given with pneumovax today.   Colonoscopy: Uptodate (2013) - normal.   Recommendations for diabetics  Eye Exam: uptodate but we need records  Foot Exam: uptodate but we need records   Recommendations for women  Mammogram: Scheduled.   PAP Smear: Declines  Next visit - 3 months.    Linda Lynn, Linda Lynn 02/28/2013 11:30 AM

## 2013-02-28 NOTE — Patient Instructions (Addendum)
Dear Ms Linda Lynn (and sister [name])  We met today and discussed about your diabetes. Please read and please comply with the following things we decided to do this visit for you.   Please take a night time snack to avoid low blood sugars in the morning.  We have given pneumovax  (a vaccine for pneumonia) this visit. Next dose when you get 65 years.  We have scheduled a screening mammogram.   We have done blood work this visit. Whenever we do blood work, we will call you with abnormal results.   Please schedule next visit in 3 months.  Thanks, and take care,  Dr. Aletta Edouard MD MPH Clinical Assistant Professor [facility]  02/28/2013  10:03 AM

## 2013-03-01 ENCOUNTER — Telehealth: Payer: Self-pay | Admitting: Internal Medicine

## 2013-03-01 NOTE — Telephone Encounter (Signed)
Called the patient's sister [name] and disclosed the results of the blood work to her since she is the main and only caregiver of this patient.

## 2013-03-08 ENCOUNTER — Ambulatory Visit (HOSPITAL_COMMUNITY)
Admission: RE | Admit: 2013-03-08 | Discharge: 2013-03-08 | Disposition: A | Discharge status: Home / Self Care | Payer: MEDICARE | Source: Ambulatory Visit | Attending: Internal Medicine | Admitting: Internal Medicine

## 2013-03-08 DIAGNOSIS — Z1231 Encounter for screening mammogram for malignant neoplasm of breast: Secondary | ICD-10-CM | POA: Insufficient documentation

## 2013-03-08 DIAGNOSIS — Z0000 Encounter for general adult medical examination without abnormal findings: Secondary | ICD-10-CM

## 2013-03-17 DIAGNOSIS — H2700 Aphakia, unspecified eye: Secondary | ICD-10-CM | POA: Diagnosis not present

## 2013-03-17 DIAGNOSIS — H18609 Keratoconus, unspecified, unspecified eye: Secondary | ICD-10-CM | POA: Diagnosis not present

## 2013-03-17 DIAGNOSIS — H4011X0 Primary open-angle glaucoma, stage unspecified: Secondary | ICD-10-CM | POA: Diagnosis not present

## 2013-04-18 DIAGNOSIS — H4011X0 Primary open-angle glaucoma, stage unspecified: Secondary | ICD-10-CM | POA: Diagnosis not present

## 2013-04-18 DIAGNOSIS — H18609 Keratoconus, unspecified, unspecified eye: Secondary | ICD-10-CM | POA: Diagnosis not present

## 2013-04-18 DIAGNOSIS — H2700 Aphakia, unspecified eye: Secondary | ICD-10-CM | POA: Diagnosis not present

## 2013-05-16 ENCOUNTER — Ambulatory Visit (INDEPENDENT_AMBULATORY_CARE_PROVIDER_SITE_OTHER): Payer: MEDICARE | Admitting: Internal Medicine

## 2013-05-16 ENCOUNTER — Other Ambulatory Visit: Payer: Self-pay | Admitting: Internal Medicine

## 2013-05-16 ENCOUNTER — Encounter: Payer: Self-pay | Admitting: Internal Medicine

## 2013-05-16 VITALS — BP 102/66 | HR 72 | Temp 97.3°F | Resp 20 | Ht <= 58 in | Wt 134.0 lb

## 2013-05-16 DIAGNOSIS — Q909 Down syndrome, unspecified: Secondary | ICD-10-CM | POA: Diagnosis not present

## 2013-05-16 DIAGNOSIS — I10 Essential (primary) hypertension: Secondary | ICD-10-CM

## 2013-05-16 DIAGNOSIS — R634 Abnormal weight loss: Secondary | ICD-10-CM | POA: Diagnosis not present

## 2013-05-16 DIAGNOSIS — E785 Hyperlipidemia, unspecified: Secondary | ICD-10-CM | POA: Diagnosis not present

## 2013-05-16 DIAGNOSIS — M109 Gout, unspecified: Secondary | ICD-10-CM

## 2013-05-16 DIAGNOSIS — E119 Type 2 diabetes mellitus without complications: Secondary | ICD-10-CM

## 2013-05-16 MED ORDER — ONETOUCH ULTRA MINI W/DEVICE KIT: PACK | Status: DC

## 2013-05-16 MED ORDER — GLIPIZIDE ER 2.5 MG PO TB24: 2.5000 mg | ORAL_TABLET | Freq: Every day | ORAL | Status: DC

## 2013-05-16 NOTE — Progress Notes (Signed)
Subjective:     Patient ID: Linda Lynn, female   DOB: 1961-08-08, 52 y.o.   MRN: [id number]  HPI Ms Linda Lynn is a 52 year old lady with Down's syndrome who is non-verbal, and is always accompanied by her sister Linda Lynn to the visit. She is followed for gout, hypertension and diabetes among other problems that she has. Her sister had reported a 20 lb weight loss in 8 months last time, and we had ordered a mammogram last visit, will continue to follow this patient up on that. In the past 2 months she has lost 10 lbs. Linda Lynn reports that Linda Lynn is having diarrhea 4-5 times a day while she has been on metformin. She denies loss of appetite, fever, chills, and says that Linda Lynn is fed a good diet. She denies heat or cold intolerance, chest pain, palpitations, or any other symptoms that Linda Lynn might have reported to her. She denies any blood in stool, a colonoscopy (9/13) done last year per Linda Lynn was normal.        Review of Systems  Constitutional: Positive for unexpected weight change. Negative for fever, chills, diaphoresis, activity change, appetite change and fatigue.  HENT: Negative for sore throat, facial swelling, rhinorrhea, mouth sores, trouble swallowing, dental problem and voice change.   Eyes: Negative for photophobia and visual disturbance.  Respiratory: Negative for cough, choking, chest tightness, shortness of breath, wheezing and stridor.   Cardiovascular: Negative for chest pain, palpitations and leg swelling.  Gastrointestinal: Positive for diarrhea. Negative for nausea, vomiting, abdominal pain, constipation, abdominal distention and anal bleeding.  Genitourinary: Negative for dysuria, frequency and difficulty urinating.  Musculoskeletal: Negative for myalgias, back pain and joint swelling.  Skin: Negative for color change, rash and wound.  Allergic/Immunologic: Negative.   Neurological: Negative for dizziness, tremors, seizures, syncope, facial asymmetry, speech difficulty, weakness,  light-headedness, numbness and headaches.  Hematological: Negative for adenopathy. Does not bruise/bleed easily.       Objective:   Physical Exam Vitals reviewed. General: Resting in bed. HEENT: Eyes abnormal, no vision.  Heart: RRR, no rubs, murmurs or gallops. Lungs: Clear to auscultation bilaterally, no wheezes, rales, or rhonchi. Abdomen: Soft, nontender, nondistended, BS present. Extremities: Warm, no pedal edema. Neuro: Alert non-verbal, advanced dementia. Moving all limbs, motor strength and sensation appears to be normal.      Assessment & Plan:     Weight loss - We will discontinue metformin and start glipizide ER 2.5 mg daily. The patient's sister will check her blood sugar as she does routinely, and if the blood sugars are running high, she will increase glipizide dose to 5 mg daily. This was done because the patient has been keeping A1c of 6-6.1 recently, so hopefully does not have hypoglycemia on a sulfonylurea. Working up the weight loss - the basic blood work done last visit, did not give any indication of any abdnormality that would lead to rapid weight loss. Mammogram done was birads 1. Today we will order TSH, CRP, ESR, Tb quantiferon, B12, Folate RBC, and Vitamin D levels. We will also do a CXR.   Diabetes - We have stopped metformin today because of intolerance and diarrhea and started glipizide at a low dose. We will do a urine microglobulin today. One glucometer ordered.   We will meet in 6 weeks time to follow up on weight loss and diabetes.   Of note: The patient left without doing labs. Contacted by RN and will come back tomorrow for them.

## 2013-05-16 NOTE — Patient Instructions (Addendum)
Hi Ms [name] (Ms Kimaya Whitlatch sister)  Today we are trying to see why your sister is loosing weight  We have stopped her metformin because she was having diarrhea on it.  We have started a new medicine GLIPIZIDE 2.5 mg take ONCE daily.  Please measure blood sugars daily for your sister, like you do now. If you think blood sugars in the morning WITHOUT FOOD are higher than 130, increase the dose to 5 mg.  We will meet in 6 weeks.  Thanks, Aletta Edouard MD MPH 05/16/2013 10:05 AM

## 2013-05-17 ENCOUNTER — Ambulatory Visit (HOSPITAL_COMMUNITY)
Admission: RE | Admit: 2013-05-17 | Discharge: 2013-05-17 | Disposition: A | Discharge status: Home / Self Care | Payer: MEDICARE | Source: Ambulatory Visit | Attending: Internal Medicine | Admitting: Internal Medicine

## 2013-05-17 ENCOUNTER — Other Ambulatory Visit (INDEPENDENT_AMBULATORY_CARE_PROVIDER_SITE_OTHER): Payer: MEDICARE

## 2013-05-17 DIAGNOSIS — E119 Type 2 diabetes mellitus without complications: Secondary | ICD-10-CM

## 2013-05-17 DIAGNOSIS — Q998 Other specified chromosome abnormalities: Secondary | ICD-10-CM

## 2013-05-17 DIAGNOSIS — M109 Gout, unspecified: Secondary | ICD-10-CM | POA: Diagnosis not present

## 2013-05-17 DIAGNOSIS — E785 Hyperlipidemia, unspecified: Secondary | ICD-10-CM | POA: Diagnosis not present

## 2013-05-17 DIAGNOSIS — R6889 Other general symptoms and signs: Secondary | ICD-10-CM | POA: Diagnosis not present

## 2013-05-17 DIAGNOSIS — R634 Abnormal weight loss: Secondary | ICD-10-CM

## 2013-05-17 DIAGNOSIS — Q909 Down syndrome, unspecified: Secondary | ICD-10-CM | POA: Diagnosis not present

## 2013-05-17 DIAGNOSIS — I10 Essential (primary) hypertension: Secondary | ICD-10-CM | POA: Diagnosis not present

## 2013-05-17 DIAGNOSIS — E559 Vitamin D deficiency, unspecified: Secondary | ICD-10-CM | POA: Diagnosis not present

## 2013-05-17 LAB — MICROALBUMIN / CREATININE URINE RATIO: Microalb Creat Ratio: 5.9 mg/g (ref 0.0–30.0)

## 2013-05-17 LAB — VITAMIN B12: Vitamin B-12: 534 pg/mL (ref 211–911)

## 2013-05-17 LAB — POCT GLYCOSYLATED HEMOGLOBIN (HGB A1C): Hemoglobin A1C: 6

## 2013-05-22 LAB — QUANTIFERON TB GOLD ASSAY (BLOOD)
Interferon Gamma Release Assay: NEGATIVE
Mitogen value: 9.73 IU/mL
TB Ag value: 0.09 IU/mL

## 2013-05-25 ENCOUNTER — Other Ambulatory Visit: Payer: Self-pay

## 2013-06-13 ENCOUNTER — Other Ambulatory Visit: Payer: Self-pay | Admitting: *Deleted

## 2013-06-13 DIAGNOSIS — E119 Type 2 diabetes mellitus without complications: Secondary | ICD-10-CM

## 2013-06-13 MED ORDER — GLUCOSE BLOOD VI STRP: ORAL_STRIP | Status: DC

## 2013-06-13 NOTE — Telephone Encounter (Signed)
Fax from pharmacy - Medicaid requires  A new rx after 5 refills or 6 months. Thanks

## 2013-06-27 DIAGNOSIS — H2700 Aphakia, unspecified eye: Secondary | ICD-10-CM | POA: Diagnosis not present

## 2013-06-27 DIAGNOSIS — H4011X0 Primary open-angle glaucoma, stage unspecified: Secondary | ICD-10-CM | POA: Diagnosis not present

## 2013-07-04 ENCOUNTER — Ambulatory Visit: Payer: MEDICARE | Admitting: Internal Medicine

## 2013-07-06 ENCOUNTER — Ambulatory Visit: Payer: MEDICARE | Admitting: Internal Medicine

## 2013-08-01 ENCOUNTER — Encounter: Payer: Self-pay | Admitting: Internal Medicine

## 2013-08-01 ENCOUNTER — Ambulatory Visit (INDEPENDENT_AMBULATORY_CARE_PROVIDER_SITE_OTHER): Payer: MEDICARE | Admitting: Internal Medicine

## 2013-08-01 ENCOUNTER — Telehealth: Payer: Self-pay | Admitting: Dietician

## 2013-08-01 VITALS — BP 104/72 | HR 64 | Temp 96.8°F | Ht <= 58 in | Wt 132.9 lb

## 2013-08-01 DIAGNOSIS — E119 Type 2 diabetes mellitus without complications: Secondary | ICD-10-CM

## 2013-08-01 DIAGNOSIS — R634 Abnormal weight loss: Secondary | ICD-10-CM | POA: Insufficient documentation

## 2013-08-01 DIAGNOSIS — Q909 Down syndrome, unspecified: Secondary | ICD-10-CM

## 2013-08-01 LAB — GLUCOSE, CAPILLARY: Glucose-Capillary: 97 mg/dL (ref 70–99)

## 2013-08-01 MED ORDER — GLUCOSE BLOOD VI STRP: ORAL_STRIP | Status: DC

## 2013-08-01 MED ORDER — ONETOUCH ULTRA MINI W/DEVICE KIT: PACK | Status: DC

## 2013-08-01 MED ORDER — ONETOUCH DELICA LANCETS FINE MISC: 1.0000 | Freq: Four times a day (QID) | Status: DC

## 2013-08-01 NOTE — Patient Instructions (Addendum)
Ms Desiree Hane (Sister of Ms Cerino),  Your sister is doing well. Her blood pressure is controlled.  Her 1-2 times of very high blood sugars can be due to testing blood sugar without washing hands. Please keep testing her blood sugars and bring another log to me. Your sister has improved in her loose stools after stopping metformin.  Your sister has lost only 1 lb since last visit. We will keep a check on the weight.   Next visit in 2 months for diabetes and weight loss.   Thanks, Aletta Edouard MD MPH 08/01/2013 10:19 AM

## 2013-08-01 NOTE — Telephone Encounter (Signed)
DR.GROAT'S OFFICE TO FAX RESULTS TODAY OF LAST EYE EXAM WAS 04-18-2013

## 2013-08-01 NOTE — Assessment & Plan Note (Signed)
Assessment:  Well controlled glucose log, a few odd values of 500-600 all clustered at the same time. I asked about testing technique to sister Elta and mentioned how blood sugars can be falsely high if the patient's hands are not washed andhave food particles on them. Elta wants a new glucometer because she thinks that the instrument has turned faulty. Today's sugar is 97 in clinic.   Lab Results  Component Value Date   HGBA1C 6.0 05/17/2013   HGBA1C 6.0 02/28/2013   HGBA1C 6.1 05/04/2012   Lab Results  Component Value Date   MICROALBUR 0.95 05/16/2013   LDLCALC 69 02/28/2013   CREATININE 0.99 02/28/2013   Plan:   New glucometer and test strip refills given.  Went over testing technique and importance of washing hands before testing.   Will continue glipizide XL 2.5 for now  Patient will monitor blood sugars and bring in log.

## 2013-08-01 NOTE — Assessment & Plan Note (Signed)
Assessment:  I am unsure of the reason. Blood work, mammogram, chest xray, TB test, colonoscopy are negative. The patient's weight loss was thought to be due to the constant diarrhea that the sister reported that the patient has, and we stopped metformin last visit. This visit the patient has lost 1 lb and is reported to have controlled bowel movements. I also wonder about the diet that the patient gets and if the constant decrease in weight is due to restriction of diet. Sister Desiree Hane is not able to elaborate on it other than admitting that she tries to restrict the patient's diet. Also, there are many odd discrepancies between the weights and heights recorded for the patient over time which do not make sense to me.    Plan:  I am not sure what goes on in the home of the patient; I would like to involve a Child psychotherapist on the case and take the help of our case manager Lynnae January on this case.   We will continue to monitor weight loss at present and do all age appropriate testing as needed for work up.

## 2013-08-01 NOTE — Progress Notes (Signed)
Subjective:     Patient ID: Linda Lynn, female   DOB: [birth year], 52 y.o.   MRN: [id number]  HPI Ms Steelman is a 52 year old lady with Down's Syndrome who is brought to the clinic by sister Desiree Hane, who is the primary care giver. Last two visits Elta complained about the constant weight loss experienced by the patient and we did a work up for the same which has not revealed anything so far (besides a non-specific mild elevation of CRP and ESR). She also complained about constant diarrhea since the patient was started on Metformin and so we stopped that. Elta says that the patient has had much less diarrhea since then. I asked Elta about her sister's diet again, and she said that her sister ate a lot, and she tried to curtail her eating. This information is different from the one she provided me with last visit (where she said that the patient was given a good diet). Since March 2013, the patient has lost more than 30 lbs with no sign of any malignancy or infection so far. I do note discrepancies in weight and height measurements over the years in the [facility].  The patient's sister has measured her blood sugars and reports sporadic highs in the ranges of 500-600. Other measurements are within 200s or below.  The patient's sister does not report any chest pain, shortness of breath, abdominal pain, signs of hypoglycemia.   Review of Systems As per HPI    Objective:   Physical Exam General: No acute distress.  Visually impaired. CV: S1S2 RRR, no murmur Lungs: Bilateral Vesicular breath sounds.  Abdomen: Soft, non-tender, benign Pedal Edema: none Pedal pulses present.  Neuro: Alert, non-verbal, mentally challenged, not competent. No gross deficits seen.      Assessment & Plan:     Please see problem based charting. Next visit 3 months

## 2013-08-02 NOTE — Telephone Encounter (Signed)
Thank you :)

## 2013-12-10 ENCOUNTER — Other Ambulatory Visit: Payer: Self-pay | Admitting: Internal Medicine

## 2013-12-10 DIAGNOSIS — E119 Type 2 diabetes mellitus without complications: Secondary | ICD-10-CM

## 2014-01-09 ENCOUNTER — Encounter: Payer: MEDICARE | Admitting: Internal Medicine

## 2014-01-10 ENCOUNTER — Other Ambulatory Visit: Payer: Self-pay | Admitting: *Deleted

## 2014-01-12 MED ORDER — PRAVASTATIN SODIUM 40 MG PO TABS: 40.0000 mg | ORAL_TABLET | Freq: Every day | ORAL | Status: DC

## 2014-02-27 ENCOUNTER — Ambulatory Visit (INDEPENDENT_AMBULATORY_CARE_PROVIDER_SITE_OTHER): Payer: MEDICARE | Admitting: Internal Medicine

## 2014-02-27 ENCOUNTER — Encounter: Payer: Self-pay | Admitting: Internal Medicine

## 2014-02-27 VITALS — BP 103/67 | HR 83 | Temp 97.6°F | Wt 126.1 lb

## 2014-02-27 DIAGNOSIS — E119 Type 2 diabetes mellitus without complications: Secondary | ICD-10-CM | POA: Diagnosis not present

## 2014-02-27 DIAGNOSIS — Z0000 Encounter for general adult medical examination without abnormal findings: Secondary | ICD-10-CM

## 2014-02-27 DIAGNOSIS — Q909 Down syndrome, unspecified: Secondary | ICD-10-CM

## 2014-02-27 DIAGNOSIS — M109 Gout, unspecified: Secondary | ICD-10-CM

## 2014-02-27 DIAGNOSIS — R634 Abnormal weight loss: Secondary | ICD-10-CM

## 2014-02-27 DIAGNOSIS — I10 Essential (primary) hypertension: Secondary | ICD-10-CM | POA: Diagnosis not present

## 2014-02-27 DIAGNOSIS — T7401XA Adult neglect or abandonment, confirmed, initial encounter: Secondary | ICD-10-CM

## 2014-02-27 LAB — COMPLETE METABOLIC PANEL WITH GFR
ALT: 44 U/L — AB (ref 0–35)
AST: 34 U/L (ref 0–37)
Albumin: 3.4 g/dL — ABNORMAL LOW (ref 3.5–5.2)
Alkaline Phosphatase: 68 U/L (ref 39–117)
BILIRUBIN TOTAL: 0.5 mg/dL (ref 0.2–1.2)
BUN: 20 mg/dL (ref 6–23)
CALCIUM: 9 mg/dL (ref 8.4–10.5)
CO2: 29 meq/L (ref 19–32)
Chloride: 104 mEq/L (ref 96–112)
Creat: 0.91 mg/dL (ref 0.50–1.10)
GFR, Est African American: 83 mL/min
GFR, Est Non African American: 72 mL/min
GLUCOSE: 75 mg/dL (ref 70–99)
Potassium: 4.2 mEq/L (ref 3.5–5.3)
SODIUM: 142 meq/L (ref 135–145)
TOTAL PROTEIN: 6.8 g/dL (ref 6.0–8.3)

## 2014-02-27 LAB — CBC WITH DIFFERENTIAL/PLATELET
BASOS ABS: 0 10*3/uL (ref 0.0–0.1)
BASOS PCT: 1 % (ref 0–1)
EOS ABS: 0 10*3/uL (ref 0.0–0.7)
Eosinophils Relative: 1 % (ref 0–5)
HCT: 36.1 % (ref 36.0–46.0)
Hemoglobin: 11.9 g/dL — ABNORMAL LOW (ref 12.0–15.0)
Lymphocytes Relative: 41 % (ref 12–46)
Lymphs Abs: 2 10*3/uL (ref 0.7–4.0)
MCH: 28.5 pg (ref 26.0–34.0)
MCHC: 33 g/dL (ref 30.0–36.0)
MCV: 86.4 fL (ref 78.0–100.0)
Monocytes Absolute: 0.4 10*3/uL (ref 0.1–1.0)
Monocytes Relative: 9 % (ref 3–12)
NEUTROS ABS: 2.3 10*3/uL (ref 1.7–7.7)
NEUTROS PCT: 48 % (ref 43–77)
PLATELETS: 205 10*3/uL (ref 150–400)
RBC: 4.18 MIL/uL (ref 3.87–5.11)
RDW: 15.5 % (ref 11.5–15.5)
WBC: 4.8 10*3/uL (ref 4.0–10.5)

## 2014-02-27 LAB — GLUCOSE, CAPILLARY: GLUCOSE-CAPILLARY: 116 mg/dL — AB (ref 70–99)

## 2014-02-27 LAB — POCT GLYCOSYLATED HEMOGLOBIN (HGB A1C): HEMOGLOBIN A1C: 5.9

## 2014-02-27 LAB — URIC ACID: URIC ACID, SERUM: 2.1 mg/dL — AB (ref 2.4–7.0)

## 2014-02-27 NOTE — Patient Instructions (Addendum)
Linda Lynn's sister Linda Lynn,   Linda Lynn is doing well, however her weight has been decreasing. I think it is because of the diet plan that both of you sisters are on for weight loss, however, we will do some blood tests.  I think that Linda Lynn has reached 126 lbs, and she needs to maintain this weight and perhaps do a little bit weight loss but not more. Its time for another mammogram, and we will order it.    Please bring the glucometer and all her pill bottles. Next visit in 2 months.   Thanks, Aletta EdouardShilpa Wilburta Milbourn MD MPH 02/27/2014 10:12 AM

## 2014-02-27 NOTE — Progress Notes (Signed)
Subjective:     Patient ID: Linda Lynn, female   DOB: 04/24/1961, 53 y.o.   MRN: [id number]  HPI Patient comes back with sister Linda Lynn, who is the primary caregiver.  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 She is follows up in clinic for diabetes, gout and general well being.   Weight Loss I have been reviewing her weight loss recently. Below is her weight since last year.   Vitals - 1 value per visit 02/27/2014 08/01/2013 05/16/2013 02/28/2013 06/16/2012  Weight (lb) 126.1 132.9 134 144.7 163.3   Vitals - 1 value per visit 06/08/2012 05/18/2012 05/04/2012  Weight (lb) 162.7 162.6 161.5   On one hand sister Linda Lynn says that the patient eats a lot. On the other hand she tells me that she herself is on a weight loss diet for the past year, and gives the same to Linda Lynn. Breakfast typically is one bowl of oatmeal, with half a banana, lunch is with her CNA, which is usually some meat, vegetables and 1 medium bowl of rice or breads, and dinner is again with Linda Lynn, which is again few pieces of baked meat, 1 medium bowl of rice, and some steamed vegetables. There are no snacks in between. Linda Lynn has lost 30-40 lbs per her report over the past year.   Even so, because of the variable history, I have worked up the patient and she has had negative blood work, low normal TSH, negative mammogram and chest xray, and negative colonoscopy in 2013. Linda Lynn denies any signs of night sweats, cough, abdominal pain or fullness or nausea and vomiting in the patient. Initially, we had also thought that the diarrhea that the patient had due to metformin was perhaps causing the weight loss, however, on stopping metformin the diarrhea has stopped but the weight loss continues.   Medication review Linda Lynn is not sure of the patient's gout medications. She says she gives her glipizide and pravastatin. Rest, she is not sure of.  Gout The patient has not had a gout attack in the recent  past.  Diabetes She is compliant with glipizide. She tolerates it well. No signs of hypoglycemia reported. A1c today is 5.9.   Review of Systems  Constitutional: Negative for fever, chills, diaphoresis, activity change, fatigue and unexpected weight change (The patient reports to be on a diet, and she expects the weight changes.).  HENT: Negative for nosebleeds, sore throat and trouble swallowing.   Respiratory: Negative for cough, chest tightness, shortness of breath and wheezing.   Cardiovascular: Negative for chest pain, palpitations and leg swelling.  Gastrointestinal: Negative for nausea, vomiting, abdominal pain, diarrhea, constipation, blood in stool, abdominal distention and anal bleeding.  Endocrine: Negative for cold intolerance, heat intolerance and polyuria.  Genitourinary: Negative for dysuria and difficulty urinating.  Musculoskeletal: Negative for arthralgias and myalgias.  Skin: Negative for color change, pallor, rash and wound.  Neurological: Negative for dizziness, weakness, light-headedness and headaches.  Hematological: Negative for adenopathy. Does not bruise/bleed easily.       Objective:   Physical Exam  Constitutional: She appears well-developed and well-nourished. No distress.  HENT:  Head: Normocephalic and atraumatic.  Eyes: Left eye exhibits no discharge.  Neck: Normal range of motion. Neck supple.  Cardiovascular: Normal rate, regular rhythm and normal heart sounds.   No murmur heard. Pulmonary/Chest: Effort normal and breath sounds normal. No respiratory distress. She has no wheezes. She has no rales. She exhibits no tenderness.  Abdominal: Soft. Bowel sounds  are normal. She exhibits no distension and no mass. There is no tenderness.  Lymphadenopathy:    She has no cervical adenopathy.  Neurological: She is alert.  Skin: Skin is warm. No abrasion, no bruising, no burn, no petechiae and no rash noted. She is not diaphoretic. No cyanosis or erythema. No  pallor. Nails show no clubbing.       Assessment:     Please see problem based charting.

## 2014-02-27 NOTE — Assessment & Plan Note (Signed)
I have read this note and descriptions of previous providers. However, I have not been able to find signs of abuse on my physicals. This visit, I did a limited exam of patient's skin - front and back of torso, arms and legs, neck. Buttocks and private parts were, however, not examined. I have not found any marks or bruising or wounds of any nature on my exam. The patient has not had any fractures in the past or any signs of violence. The patient appears to be adequately nourished and does not complain of any pain. There have been no alarming number of missed visits to my knowledge, the patient has been brought to appointments on time.

## 2014-02-27 NOTE — Assessment & Plan Note (Signed)
Well controlled.  BP Readings from Last 3 Encounters:  02/27/14 103/67  08/01/13 104/72  05/16/13 102/66

## 2014-02-27 NOTE — Assessment & Plan Note (Signed)
Assessment: I am coming to teh conclusion that the loss of weight observed is most likely due to intentional dieting, even though her sister keeps telling me that the patient eats a lot. Regardless we will repeat some blood work.  Plan:  CMP, TSH and CBC to repeat.  Mammogram to repeat yearly.  Discussed with Veryl Speaktta that it might be time to maintain a certain weight by the patient, and even though Veryl Speaktta can continue her weight loss diet, Linda Landngela needs to maintain her current weight. She understands.

## 2014-02-27 NOTE — Assessment & Plan Note (Signed)
Assessment: Unsure of her gout medications. I am not sure if she is taking them.  Does not report new attack.  Plan:  Asked to bring all pill bottles next visit.  Uric Acid this visit.

## 2014-02-27 NOTE — Assessment & Plan Note (Signed)
Assessment: Denies any symptoms of hypoglycemia.  Well controlled on glipizide.   Plan:  Continue current treatment.   Educated about hypoglycemia.   Bring glucometer next time.

## 2014-02-27 NOTE — Assessment & Plan Note (Signed)
Health Maintenance Due  Topic Date Due  . Hemoglobin A1c  08/17/2013   Done today. Mammogram ordered.

## 2014-02-28 NOTE — Progress Notes (Signed)
  ADDENDUM   I have reviewed the labs we did for this visit and they are within normal limits. Uric Acid is 2.1. I will review patient's gout medications next visit when she brings all her pill bottles.

## 2014-03-01 LAB — TSH: TSH: 0.545 u[IU]/mL (ref 0.350–4.500)

## 2014-03-09 ENCOUNTER — Other Ambulatory Visit: Payer: Self-pay | Admitting: Internal Medicine

## 2014-03-09 ENCOUNTER — Ambulatory Visit (HOSPITAL_COMMUNITY)
Admission: RE | Admit: 2014-03-09 | Discharge: 2014-03-09 | Disposition: A | Discharge status: Home / Self Care | Payer: MEDICARE | Source: Ambulatory Visit | Attending: Internal Medicine | Admitting: Internal Medicine

## 2014-03-09 DIAGNOSIS — Z1231 Encounter for screening mammogram for malignant neoplasm of breast: Secondary | ICD-10-CM

## 2014-03-09 DIAGNOSIS — Z0000 Encounter for general adult medical examination without abnormal findings: Secondary | ICD-10-CM

## 2014-03-11 ENCOUNTER — Other Ambulatory Visit: Payer: Self-pay | Admitting: Internal Medicine

## 2014-04-24 ENCOUNTER — Ambulatory Visit (INDEPENDENT_AMBULATORY_CARE_PROVIDER_SITE_OTHER): Payer: MEDICARE | Admitting: Internal Medicine

## 2014-04-24 ENCOUNTER — Encounter: Payer: Self-pay | Admitting: Internal Medicine

## 2014-04-24 VITALS — BP 106/67 | HR 53 | Temp 97.5°F | Ht <= 58 in | Wt 128.8 lb

## 2014-04-24 DIAGNOSIS — E119 Type 2 diabetes mellitus without complications: Secondary | ICD-10-CM | POA: Diagnosis not present

## 2014-04-24 DIAGNOSIS — R634 Abnormal weight loss: Secondary | ICD-10-CM

## 2014-04-24 NOTE — Progress Notes (Signed)
Subjective:   Patient ID: Linda Lynn female   DOB: 11/03/1961 53 y.o.   MRN: [id number]  HPI: Linda Lynn is a 53 y.o. woman with Downs syndrome, DM, HLD comes to the office along with her sister for a discussing the labs that we have done in April 2015.  Patient has downs syndrome and have no insight about her medical problems and does not have the capacity to make decisions. The history is obtained from her sister, Charlett Blake, who is her primary care taker all her life.   Patients sister was diagnosed with "thyroid" problem and is wondering if Linda Lynn also has thyroid problems. Patient was tested for TSH in July 2014 and April 2015 and at both the times patients TSH was within normal limits. Patient lost about 100 pounds in the last 2.5 years and is documented in her chart. Apparently, patients sister is on a weight loss diet and hence Linda Lynn is also eating low calorie diet. Patient had a mammogram in April 2015 that was negative and her last colonoscopy in 2013 was also negative for any malignancies.   Patient denies any other complaints.   Past Medical History  Diagnosis Date  . Hypertension   . Diabetes mellitus   . Gout   . 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 Hyperlipidemia    Current Outpatient Prescriptions  Medication Sig Dispense Refill  . Blood Glucose Monitoring Suppl (ONE TOUCH ULTRA MINI) W/DEVICE KIT Check blood sugar before meals and bedtime dx code 250.00  1 each  0  . colchicine 0.6 MG tablet Take 1 tablet (0.6 mg total) by mouth daily.  30 tablet  4  . glipiZIDE (GLUCOTROL XL) 2.5 MG 24 hr tablet Take 1 tablet (2.5 mg total) by mouth daily with breakfast.  30 tablet  11  . glucose blood (ONE TOUCH ULTRA TEST) test strip Check blood sugar four times daily before meals and bedtime dx code 250.00  150 each  12  . latanoprost (XALATAN) 0.005 % ophthalmic solution       . ONETOUCH DELICA LANCETS FINE MISC 1 each by Does  not apply route QID. To check blood sugar four times daily. Dx code 250.00  100 each  12  . pravastatin (PRAVACHOL) 40 MG tablet Take 1 tablet (40 mg total) by mouth daily.  90 tablet  3  . ULORIC 40 MG tablet TAKE 2 TABLETS (80 MG TOTAL) BY MOUTH DAILY.  180 tablet  3   No current facility-administered medications for this visit.   Family History  Problem Relation Age of Onset  . Colon cancer Cousin   . Diabetes Sister   . Hypertension Sister   . Diabetes Sister   . Hypertension Sister    History   Social History  . Marital Status: Single    Spouse Name: N/A    Number of Children: N/A  . Years of Education: N/A   Social History Main Topics  . Smoking status: Never Smoker   . Smokeless tobacco: Never Used  . Alcohol Use: No  . Drug Use: No  . Sexual Activity: None   Other Topics Concern  . None   Social History Narrative  . None   Review of Systems: Pertinent items are noted in HPI. Objective:  Physical Exam: Filed Vitals:   04/24/14 0940  BP: 106/67  Pulse: 53  Temp: 97.5 F (36.4 C)  TempSrc: Oral  Height:  4' 8"  (1.422 m)  Weight: 128 lb 12.8 oz (58.423 kg)   Constitutional: Vital signs reviewed.   Patient appears severely mentally retarded, well-developed and well-nourished and is in no acute distress and cooperative with exam.  Head: Normocephalic and atraumatic. Mild frontal alopecia noted. Eyes: Right corneal opacity noted.  Cardiovascular: RRR, S1 normal, S2 normal, no MRG Pulmonary/Chest: normal respiratory effort, CTAB, no wheezes, rales, or rhonchi Extremities: No pedal edema. Neurological: Alert and oriented to person and is at her baseline as per her sister.   Assessment & Plan:

## 2014-04-24 NOTE — Assessment & Plan Note (Signed)
She has documented weight loss of about 100 pounds in the past 2.5 years. Unclear etiology - suspect secondary to reduced calorie intake and normal aging in the setting of Downs syndrome (life expectancy in [place] syndrome patients is around 62 - 58 years as per up-to-date) Mammogram (2015) and coloscopy(2013) are negative and TSH x 2 is within normal limits. Patient without any pertinent presenting symptoms currently. Discussed with the attending regarding further management and plan.  Plans: Recommended good nutritious diet. Follow up with PCP. Follow up as needed if she develops any new symptoms.

## 2014-04-24 NOTE — Assessment & Plan Note (Signed)
Well controlled with an A1C of 5.9 Patients sister denies any blood sugar values less than 100.  Plans: Continue current management.

## 2014-04-24 NOTE — Patient Instructions (Signed)
Take all the medications as recommended below. 

## 2014-04-26 NOTE — Progress Notes (Signed)
Case discussed with Dr. Boggala at the time of the visit.  We reviewed the resident's history and exam and pertinent patient test results.  I agree with the assessment, diagnosis, and plan of care documented in the resident's note. 

## 2014-07-24 ENCOUNTER — Encounter: Payer: Self-pay | Admitting: Internal Medicine

## 2014-07-24 ENCOUNTER — Ambulatory Visit (INDEPENDENT_AMBULATORY_CARE_PROVIDER_SITE_OTHER): Payer: MEDICARE | Admitting: Internal Medicine

## 2014-07-24 VITALS — BP 107/63 | HR 74 | Temp 97.5°F | Ht <= 58 in | Wt 124.3 lb

## 2014-07-24 DIAGNOSIS — E1169 Type 2 diabetes mellitus with other specified complication: Secondary | ICD-10-CM

## 2014-07-24 DIAGNOSIS — E11649 Type 2 diabetes mellitus with hypoglycemia without coma: Secondary | ICD-10-CM

## 2014-07-24 DIAGNOSIS — Q909 Down syndrome, unspecified: Secondary | ICD-10-CM | POA: Diagnosis not present

## 2014-07-24 DIAGNOSIS — Z23 Encounter for immunization: Secondary | ICD-10-CM

## 2014-07-24 DIAGNOSIS — R634 Abnormal weight loss: Secondary | ICD-10-CM | POA: Diagnosis not present

## 2014-07-24 DIAGNOSIS — M1A0790 Idiopathic chronic gout, unspecified ankle and foot, without tophus (tophi): Secondary | ICD-10-CM

## 2014-07-24 DIAGNOSIS — E785 Hyperlipidemia, unspecified: Secondary | ICD-10-CM | POA: Diagnosis not present

## 2014-07-24 DIAGNOSIS — I10 Essential (primary) hypertension: Secondary | ICD-10-CM

## 2014-07-24 DIAGNOSIS — Z0000 Encounter for general adult medical examination without abnormal findings: Secondary | ICD-10-CM | POA: Diagnosis not present

## 2014-07-24 DIAGNOSIS — M109 Gout, unspecified: Secondary | ICD-10-CM

## 2014-07-24 DIAGNOSIS — E559 Vitamin D deficiency, unspecified: Secondary | ICD-10-CM | POA: Insufficient documentation

## 2014-07-24 DIAGNOSIS — E119 Type 2 diabetes mellitus without complications: Secondary | ICD-10-CM | POA: Diagnosis present

## 2014-07-24 LAB — GLUCOSE, CAPILLARY: Glucose-Capillary: 56 mg/dL — ABNORMAL LOW (ref 70–99)

## 2014-07-24 LAB — POCT GLYCOSYLATED HEMOGLOBIN (HGB A1C): Hemoglobin A1C: 5.5

## 2014-07-24 MED ORDER — FEBUXOSTAT 40 MG PO TABS: 40.0000 mg | ORAL_TABLET | Freq: Every day | ORAL | Status: DC

## 2014-07-24 MED ORDER — CHOLECALCIFEROL 50 MCG (2000 UT) PO CAPS: ORAL_CAPSULE | ORAL | Status: DC

## 2014-07-24 NOTE — Assessment & Plan Note (Addendum)
Uric acid is 2. No gout attacks in a long time.She is taking Uloric 40 mg daily - I will continue that. Repeat uric acid in 2-3 months.

## 2014-07-24 NOTE — Progress Notes (Signed)
Subjective:     Patient ID: Linda Lynn, female   DOB: 1961-01-06, 53 y.o.   MRN: [id number]  Diabetes   53 year old with Down's syndrome, coming with sister Linda Lynn, complain weight loss, and diarrhea off and on. We have tried to do some work up around this in the past visits, which has been negative. Today, the patient has low blood sugars in clinic to 59. She was given crackers and juice. She was asymptomatic / or could not tell her symptoms as she is non-verbal. Her weight loss continues.   Vitals - 1 value per visit 07/24/2014 04/24/2014 02/27/2014 08/01/2013 05/16/2013  Weight (lb) 124.3 128.8 126.1 132.9 134   Diarrhea continues off an on - 2-3 days a week. Today, after detailed conversation with the patient it came to light that the patient's sisters suffer from thyroid problems (hyperthyroidism, she reports however I am not sure if she is confused between hyper and hypo) and thyroid problems run in the family. The patient has had low normal TSH so far on testing.   Its hard to elicit psychiatric symptoms of hyperthyroidism in this patient. Weight loss despite good appetite has been one consistent complaint. She does not have any cardiac rhythm abnormalities, she does not have any pain in the neck.  She has no other complaints. No chest pain, palpitations, shortness of breath. No urinary abnormalities.   Review of Systems As per above.     Objective:   Physical Exam  Constitutional: She appears well-developed and well-nourished. No distress.  Severe mental retardation, non-verbal.   HENT:  Head: Normocephalic and atraumatic.  Right Ear: External ear normal.  Left Ear: External ear normal.  Nose: Nose normal.  Mouth/Throat: Oropharynx is clear and moist. No oropharyngeal exudate.  Eyes: Right eye exhibits no discharge. Left eye exhibits no discharge.  Corneal opacities. No vision.  Neck:  Irregular thyroid gland, unsure of finding.   Cardiovascular: Normal rate, regular rhythm, normal  heart sounds and intact distal pulses.   No murmur heard. Pulmonary/Chest: Effort normal and breath sounds normal. No respiratory distress. She has no wheezes. She has no rales. She exhibits no tenderness.  Abdominal: Soft. Bowel sounds are normal. There is no tenderness.  Musculoskeletal: She exhibits no edema.  Lymphadenopathy:    She has no cervical adenopathy.  Neurological: She is alert. No cranial nerve deficit. She exhibits normal muscle tone. Coordination normal.  Skin: Skin is warm. She is not diaphoretic.      Assessment:     See problem based charting.

## 2014-07-24 NOTE — Assessment & Plan Note (Signed)
Lab Results  Component Value Date   CHOL 148 02/28/2013   CHOL 212* 01/19/2012   CHOL 191 10/27/2010   Lab Results  Component Value Date   HDL 65 02/28/2013   HDL 66 11/21/1094   HDL 53 10/27/2010   Lab Results  Component Value Date   LDLCALC 69 02/28/2013   LDLCALC 133* 01/19/2012   LDLCALC 123* 10/27/2010   Patients LDL and TChol have significantly reduced and HDL is 65. I will give her a statin break at this point.

## 2014-07-24 NOTE — Assessment & Plan Note (Addendum)
Mammogram, colonoscopy, chest Xray negative. No B symptoms  - Ct abdomen not done so far. No LN swelling.  Given previous low normal TSH values - will repeat hyperthyroidism work up. Also keeping lower LDL than before.   Results for Linda Lynn, Linda Lynn (MRN [id number]) as of 07/24/2014 15:33  Ref. Range 09/07/2007 20:03 05/17/2013 09:10 02/27/2014 10:24  TSH Latest Range: 0.350-4.500 uIU/mL 0.517 0.454 0.545    Work up for hyperthyroidism today.   TSH  Free T4  Free T3  Thyrotropin receptor antibodies  Will consider thyroid USG   Will consider endocrine referral if abnormal work up.   ADDENDUM - 07/30/14 10:55 am I called Linda Lynn over the phone and discussed her sister's lab results.   Results for Linda Lynn, Linda Lynn (MRN [id number]) as of 07/30/2014 10:55  Ref. Range 07/24/2014 11:01  TSH Latest Range: 0.350-4.500 uIU/mL 0.577  Free T4 Latest Range: 0.80-1.80 ng/dL 5.28 (L)  T3, Free Latest Range: 2.3-4.2 pg/mL 2.5    Ref. Range 07/24/2014 11:01  Thyrotropin Receptor Ab Latest Range: <=16.0 % <6.0   Given negative results for all work up for weight loss, and slow weight loss over the past 2 years, I still suspect diet is the main cause of her weight loss. Linda Lynn tries to diet and eat right and thus given the patient the same food that she eats.   I charted a food diary for a day for Linda Lynn.  Breakfast: 1 bowl of apple cinnamon oatmeal, Half banana, sugarfree mild - 1 cup.  Lunch: Chicken (1 serving) Rice (1 serving) Snack: 3-4 crackers Supper: 1 serving of boiled vegetables, 2 serving spoons full of rice, 3-4 pieces of chicken wings.    I discussed adding a few calorie rich snacks to her diet, more vegetable sevings and meet in 2 months.

## 2014-07-24 NOTE — Assessment & Plan Note (Signed)
Vitamin D 2000 IU daily started today.

## 2014-07-24 NOTE — Assessment & Plan Note (Signed)
BP Readings from Last 3 Encounters:  07/24/14 107/63  04/24/14 106/67  02/27/14 103/67   Well controlled without any medications.

## 2014-07-24 NOTE — Assessment & Plan Note (Signed)
Flu shot today 

## 2014-07-24 NOTE — Assessment & Plan Note (Signed)
Discontinue glipizide. Patient has had continuously decreasing A1c's - hypoglycemia today in clinic after breakfast, doubt if she needs anti-diabetics any more given decreasing weight.   Lab Results  Component Value Date   HGBA1C 5.5 07/24/2014   HGBA1C 5.9 02/27/2014   HGBA1C 6.0 05/17/2013

## 2014-07-24 NOTE — Patient Instructions (Addendum)
Ms [name],   Please stop your sisters medications:   Glipizide (sugars too low)  Pravastatin (cholesterol doing well)  Keep taking ULORIC 40 mg ONCE DAILY We will do some thyroid tests to work up the weight loss.  I will call you with results.   Thanks, Aletta Edouard MD MPH 07/24/2014 10:53 AM

## 2014-07-25 LAB — T4, FREE: FREE T4: 0.77 ng/dL — AB (ref 0.80–1.80)

## 2014-07-25 LAB — TSH: TSH: 0.577 u[IU]/mL (ref 0.350–4.500)

## 2014-07-25 LAB — T3, FREE: T3, Free: 2.5 pg/mL (ref 2.3–4.2)

## 2014-07-26 LAB — THYROTROPIN RECEPTOR AUTOABS: Thyrotropin Receptor Ab: <6 % (ref <=16.0)

## 2014-08-01 DIAGNOSIS — H4011X0 Primary open-angle glaucoma, stage unspecified: Secondary | ICD-10-CM | POA: Diagnosis not present

## 2014-08-01 DIAGNOSIS — H1710 Central corneal opacity, unspecified eye: Secondary | ICD-10-CM | POA: Diagnosis not present

## 2014-08-01 DIAGNOSIS — H2700 Aphakia, unspecified eye: Secondary | ICD-10-CM | POA: Diagnosis not present

## 2014-09-25 ENCOUNTER — Ambulatory Visit (INDEPENDENT_AMBULATORY_CARE_PROVIDER_SITE_OTHER): Payer: MEDICARE | Admitting: Internal Medicine

## 2014-09-25 ENCOUNTER — Encounter: Payer: Self-pay | Admitting: Internal Medicine

## 2014-09-25 VITALS — BP 103/67 | HR 62 | Temp 98.0°F | Wt 130.7 lb

## 2014-09-25 DIAGNOSIS — Q909 Down syndrome, unspecified: Secondary | ICD-10-CM | POA: Diagnosis not present

## 2014-09-25 DIAGNOSIS — E11649 Type 2 diabetes mellitus with hypoglycemia without coma: Secondary | ICD-10-CM | POA: Diagnosis not present

## 2014-09-25 DIAGNOSIS — M1A0790 Idiopathic chronic gout, unspecified ankle and foot, without tophus (tophi): Secondary | ICD-10-CM | POA: Diagnosis not present

## 2014-09-25 DIAGNOSIS — R634 Abnormal weight loss: Secondary | ICD-10-CM | POA: Diagnosis not present

## 2014-09-25 DIAGNOSIS — H540 Blindness, both eyes: Secondary | ICD-10-CM

## 2014-09-25 DIAGNOSIS — Z0000 Encounter for general adult medical examination without abnormal findings: Secondary | ICD-10-CM

## 2014-09-25 DIAGNOSIS — I10 Essential (primary) hypertension: Secondary | ICD-10-CM

## 2014-09-25 LAB — LIPID PANEL
Cholesterol: 201 mg/dL — ABNORMAL HIGH (ref 0–200)
HDL: 75 mg/dL (ref >39)
LDL Cholesterol: 108 mg/dL — ABNORMAL HIGH (ref 0–99)
Total CHOL/HDL Ratio: 2.7 Ratio
Triglycerides: 92 mg/dL (ref <150)
VLDL: 18 mg/dL (ref 0–40)

## 2014-09-25 LAB — URIC ACID: URIC ACID, SERUM: 2.7 mg/dL (ref 2.4–7.0)

## 2014-09-25 LAB — GLUCOSE, CAPILLARY: Glucose-Capillary: 88 mg/dL (ref 70–99)

## 2014-09-25 NOTE — Assessment & Plan Note (Addendum)
No current/recent attacks. Last uric acid was 2.  Change Uloric 40 mg to every other day. Will recheck uric acid today. We might be able to stop Uloric for now.

## 2014-09-25 NOTE — Patient Instructions (Signed)
Hi [name], and [name],   Linda Lynn is doing well, she has gained weight this visit. Please keep her at this weight, keep her on current diet. Next visit in 4-5 months unless [name] starts losing weight.  Start taking ULORIC every other day. We will measure Uric Acid and call you.  Thanks, [name] MD MPH 09/25/2014 11:22 AM

## 2014-09-25 NOTE — Assessment & Plan Note (Signed)
Not on any oral medications for diabetes at present. Hypoglycemia likely related to long periods of going without food, especially when she brings her out to clinic. Counseled patient's sister about that.

## 2014-09-25 NOTE — Assessment & Plan Note (Signed)
Patient has gained 6 lbs since I counseled sister Veryl Speaktta to not put her on a weight loss diet - a diet she had been using for herself. We will leave the patient on current diet, and monitor her weight further.

## 2014-09-25 NOTE — Assessment & Plan Note (Signed)
Lipid profile today 

## 2014-09-25 NOTE — Progress Notes (Signed)
Subjective:     Patient ID: Linda Lynn, female   DOB: [birth year], 53 y.o.   MRN: [id number]  HPI 59F with Down's, minimally verbal, follows up for gout, hypertension and diet controlled diabetes. She has been noted to have low blood sugar in clinic several times. Coming in with sister who is caretaker for routine follow up.  Reports compliance with meds.   Reports weight gain since we talked about diet last visit.  No report of hypoglycemia, although CBG in clinic is 88. Sister informs me that they had dinner at 7am and have not had anything since.  No consistent diarrhea reported, has it off and on.    Review of Systems  Constitutional: Negative for chills, diaphoresis, activity change, appetite change and fatigue.  Respiratory: Negative for choking and shortness of breath.   Cardiovascular: Negative for chest pain.  Gastrointestinal: Negative for nausea, diarrhea and constipation.  Skin: Negative.        Objective:   Physical Exam  Constitutional: She appears well-developed and well-nourished.  Severe mental retardation, non-verbal.   HENT:  Nose: Nose normal.  Eyes:  Corneal opacities. No vision.  Cardiovascular: Normal rate, regular rhythm, normal heart sounds and intact distal pulses.   No murmur heard. Pulmonary/Chest: Effort normal and breath sounds normal. No respiratory distress. She has no wheezes. She has no rales. She exhibits no tenderness.  Abdominal: Soft. Bowel sounds are normal. There is no tenderness.  Musculoskeletal: She exhibits no edema.  Lymphadenopathy:    She has no cervical adenopathy.  Neurological: She is alert. No cranial nerve deficit. She exhibits normal muscle tone. Coordination normal.  Skin: Skin is warm.       Assessment & Plan:     Please see problem based charting.

## 2015-01-21 ENCOUNTER — Telehealth: Payer: Self-pay | Admitting: Internal Medicine

## 2015-01-21 NOTE — Telephone Encounter (Signed)
Call to patient to confirm appointment for 01/22/15 at 9:15 lmtcb ° °

## 2015-01-22 ENCOUNTER — Encounter: Payer: Self-pay | Admitting: Internal Medicine

## 2015-01-22 ENCOUNTER — Ambulatory Visit (INDEPENDENT_AMBULATORY_CARE_PROVIDER_SITE_OTHER): Payer: MEDICARE | Admitting: Internal Medicine

## 2015-01-22 VITALS — BP 117/59 | HR 55 | Temp 97.7°F | Ht <= 58 in | Wt 139.3 lb

## 2015-01-22 DIAGNOSIS — M1A9XX0 Chronic gout, unspecified, without tophus (tophi): Secondary | ICD-10-CM

## 2015-01-22 DIAGNOSIS — I10 Essential (primary) hypertension: Secondary | ICD-10-CM | POA: Diagnosis not present

## 2015-01-22 DIAGNOSIS — R634 Abnormal weight loss: Secondary | ICD-10-CM

## 2015-01-22 DIAGNOSIS — Z0000 Encounter for general adult medical examination without abnormal findings: Secondary | ICD-10-CM

## 2015-01-22 DIAGNOSIS — E118 Type 2 diabetes mellitus with unspecified complications: Secondary | ICD-10-CM

## 2015-01-22 DIAGNOSIS — E119 Type 2 diabetes mellitus without complications: Secondary | ICD-10-CM | POA: Diagnosis not present

## 2015-01-22 DIAGNOSIS — M1A0790 Idiopathic chronic gout, unspecified ankle and foot, without tophus (tophi): Secondary | ICD-10-CM

## 2015-01-22 LAB — POCT GLYCOSYLATED HEMOGLOBIN (HGB A1C): HEMOGLOBIN A1C: 6.2

## 2015-01-22 LAB — GLUCOSE, CAPILLARY: Glucose-Capillary: 85 mg/dL (ref 70–99)

## 2015-01-22 NOTE — Progress Notes (Signed)
   Subjective:    Patient ID: Linda Lynn, female    DOB: 1961/09/25, 54 y.o.   MRN: [id number]  HPI Ms Linda Lynn comes with her sister Linda Lynn as usual. She is a 54 year old lady with down's syndrome who is almost non-verbal. She is seen in the clinic for diabetes, gout, hypertension and investigations for complaint of weight loss had been done for her in the past. It seems like her gout, hypertension and diabetes were a result of her weight and have resolved since she lost weight. However, losing weight then became a concern, and after many negative work ups, it was realized that sister Linda Lynn was trying to lose weight and had been giving the same weight loss diet to Linda Lynn. Once we improved the diet, she has shown improvement in the weight. Today she is taking no medications, her diabetes, hypertension and gout are well controlled. She has a slightly overweight - healthy weight which I would like her to maintain/or go down a notch. She has no complaints.  Review of Systems Negative    Objective:   Physical Exam  Constitutional: She appears well-developed and well-nourished.  Severe mental retardation, non-verbal.   HENT:  Nose: Nose normal.  Eyes:  Corneal opacities. No vision.  Cardiovascular: Normal rate, regular rhythm, normal heart sounds and intact distal pulses.   No murmur heard. Pulmonary/Chest: Effort normal and breath sounds normal. No respiratory distress. She has no wheezes. She has no rales. She exhibits no tenderness.  Abdominal: Soft. Bowel sounds are normal. There is no tenderness.  Musculoskeletal: She exhibits no edema.  Lymphadenopathy:    She has no cervical adenopathy.  Neurological: She is alert. No cranial nerve deficit. She exhibits normal muscle tone. Coordination normal.  Skin: Skin is warm.      Assessment & Plan:  Please see problem based charting.

## 2015-01-22 NOTE — Assessment & Plan Note (Signed)
The patient has had no gout attacks in the past 2+ years since I started seeing her. Her uric acid levels are:   Results for [name], Linda Lynn (MRN [id number]) as of 01/22/2015 10:16  Ref. Range 11/13/2011 11:54 02/04/2012 11:58 02/28/2013 10:09 02/27/2014 10:24 09/25/2014 11:21  Uric Acid, Serum Latest Range: 2.4-7.0 mg/dL 8.5 (H) 4.7 1.5 (L) 2.1 (L) 2.7   We will check uric acid levels 6 monthly to see if there is a rising trend.

## 2015-01-22 NOTE — Patient Instructions (Signed)
Ms Linda Lynn (Linda Lynn)  I think Linda Lynn is doing well.  Her diabetes is diet controlled.  Keep her at a balanced weight. Let us meet for a health check up in 6 months.  Thanks, Aletta EdouardShilpa Siddhanth Denk MD MPH 01/22/2015 10:04 AM

## 2015-01-22 NOTE — Assessment & Plan Note (Signed)
Well controlled with diet. Metformin was stopped long back by me due to concern of hypoglycemia and diarrhea.   Lab Results  Component Value Date   HGBA1C 6.2 01/22/2015   HGBA1C 5.5 07/24/2014   HGBA1C 5.9 02/27/2014

## 2015-01-22 NOTE — Assessment & Plan Note (Signed)
Well controlled on no medications.  BP Readings from Last 3 Encounters:  01/22/15 117/59  09/25/14 103/67  07/24/14 107/63

## 2015-01-22 NOTE — Assessment & Plan Note (Signed)
Serial weights for the patient are as below. Vitals - 1 value per visit Weight (lb) BMI  01/22/2015 139.3 31.25  09/25/2014 130.7 29.32  07/24/2014 124.3 27.88  04/24/2014 128.8 28.89  02/27/2014 126.1 27.06  08/01/2013 132.9 28.51  05/16/2013 134 29.52  02/28/2013 144.7 31.87  06/16/2012 163.3 35.97  06/08/2012 162.7 44.01  05/18/2012 162.6 43.98  05/04/2012 161.5 43.68  03/24/2012 155 43.59  03/10/2012 155 43.59  02/24/2012 165.5 41.44   BMI has gone up to 31 - good time to stop and try to go down a notch or at least maintain.

## 2015-01-22 NOTE — Assessment & Plan Note (Signed)
   The patient's sister does not want a mammogram done this year. She wants them at 2 yearly intervals. The risks and benefits if the decision have been explained to her.   The patient will get a flu shot next visit.  Foot exam next visit.

## 2015-02-20 ENCOUNTER — Telehealth: Payer: Self-pay | Admitting: Internal Medicine

## 2015-02-20 NOTE — Telephone Encounter (Signed)
Called Dr. Laruth BouchardGroat's office.  Patient last seen in September of 2015.  Office notes being faxed.

## 2015-03-25 ENCOUNTER — Encounter: Payer: Self-pay | Admitting: Licensed Clinical Social Worker
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
## 2015-03-28 ENCOUNTER — Telehealth: Payer: Self-pay | Admitting: *Deleted

## 2015-03-28 ENCOUNTER — Encounter: Payer: Self-pay | Admitting: Internal Medicine

## 2015-03-28 NOTE — Telephone Encounter (Signed)
Caregiver of pt called [phone]  - needs a letter for court 04/04/15 that pt has Down Syndrome. Stanton KidneyDebra Yordan Martindale RN 03/28/15 10:05AM

## 2015-03-28 NOTE — Telephone Encounter (Signed)
Sister is aware letter is ready.

## 2015-03-28 NOTE — Telephone Encounter (Signed)
Written, printed and handed over to RN Debra Ditzler.

## 2015-05-30 ENCOUNTER — Telehealth: Payer: Self-pay | Admitting: Dietician

## 2015-05-30 ENCOUNTER — Ambulatory Visit: Payer: MEDICARE | Admitting: Internal Medicine

## 2015-05-30 NOTE — Telephone Encounter (Signed)
Thank you Lupita Leashonna, I did see Dr Laruth BouchardGroat's note for Linda Lynn. I am not sure which appointment she missed and front desk might be able to help her with that.

## 2015-05-30 NOTE — Telephone Encounter (Signed)
Spoke with [place] caretaker and sister, Veryl Speaktta. She says Linda Lynn was last seen by Dr. Dione BoozeGroat last October or November of last year, that she is totally blind and does not take eye drops. CDE also discussed missed appointment with a doctor today, Veryl Speaktta requested her call be transferred to the front desk to reschedule

## 2015-06-07 ENCOUNTER — Ambulatory Visit (INDEPENDENT_AMBULATORY_CARE_PROVIDER_SITE_OTHER): Payer: MEDICARE | Admitting: Internal Medicine

## 2015-06-07 ENCOUNTER — Encounter: Payer: Self-pay | Admitting: Internal Medicine

## 2015-06-07 VITALS — BP 135/66 | HR 62 | Temp 97.9°F | Ht <= 58 in | Wt 151.7 lb

## 2015-06-07 DIAGNOSIS — M109 Gout, unspecified: Secondary | ICD-10-CM

## 2015-06-07 MED ORDER — COLCHICINE 0.6 MG PO TABS: 0.6000 mg | ORAL_TABLET | Freq: Two times a day (BID) | ORAL | Status: DC

## 2015-06-07 NOTE — Progress Notes (Signed)
   Subjective:    Patient ID: Linda Lynn, female    DOB: Mar 18, 1961, 54 y.o.   MRN: [id number]  HPI Comments: Linda Lynn is a 54 year old woman with PMH as below here for swollen left hand.  She is accompanied by her sister who is her primary care taker and provides the history.  Please see problem based charting for assessment and plan.     Past Medical History  Diagnosis Date  . Hypertension   . Diabetes mellitus   . Gout   . 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 Hyperlipidemia    Current Outpatient Prescriptions on File Prior to Visit  Medication Sig Dispense Refill  . Blood Glucose Monitoring Suppl (ONE TOUCH ULTRA MINI) W/DEVICE KIT Check blood sugar before meals and bedtime dx code 250.00 (Patient not taking: Reported on 01/22/2015) 1 each 0  . Cholecalciferol (HM VITAMIN D3) 2000 UNITS CAPS Take 1  Capsule daily. (Patient not taking: Reported on 01/22/2015) 90 each PRN  . glucose blood (ONE TOUCH ULTRA TEST) test strip Check blood sugar four times daily before meals and bedtime dx code 250.00 (Patient not taking: Reported on 01/22/2015) 150 each 12  . latanoprost (XALATAN) 0.005 % ophthalmic solution     . ONETOUCH DELICA LANCETS FINE MISC 1 each by Does not apply route QID. To check blood sugar four times daily. Dx code 250.00 (Patient not taking: Reported on 01/22/2015) 100 each 12   No current facility-administered medications on file prior to visit.    Review of Systems  Constitutional: Negative for fever, chills, activity change and appetite change.  Gastrointestinal: Negative for nausea, vomiting, diarrhea and constipation.  Genitourinary: Negative for difficulty urinating.  Musculoskeletal: Positive for joint swelling.      Objective:   Physical Exam  Constitutional: She appears well-developed. No distress.  HENT:  Head: Normocephalic and atraumatic.  Mouth/Throat: Oropharynx is clear and moist. No oropharyngeal exudate.    Neck: Neck supple.  Cardiovascular: Normal rate, regular rhythm and normal heart sounds.  Exam reveals no gallop and no friction rub.   No murmur heard. Pulmonary/Chest: Effort normal and breath sounds normal. No respiratory distress. She has no wheezes. She has no rales.  Abdominal: Soft. Bowel sounds are normal. She exhibits no distension and no mass. There is no tenderness. There is no rebound and no guarding.  Musculoskeletal: Normal range of motion. She exhibits edema and tenderness.  Mild swelling left 1st MCP and wrist.  TTP.  No erythema.  Neurological: She is alert.  Skin: Skin is warm. She is not diaphoretic.  Psychiatric: She has a normal mood and affect.  Vitals reviewed.         Assessment & Plan:  Please see problem based charting for assessment and plan.

## 2015-06-07 NOTE — Patient Instructions (Signed)
1. I have prescribed colchicine 0.6mg  twice daily to help your gout.  I will call you if there are problems with your labs.  Please come back to see Dr. Dalphine Handing in September.  Come back to clinic sooner if your hand does not get better.   2. Please take all medications as prescribed.    3. If you have worsening of your symptoms or new symptoms arise, please call the clinic ([phone]), or go to the ER immediately if symptoms are severe.   Gout Gout is an inflammatory arthritis caused by a buildup of uric acid crystals in the joints. Uric acid is a chemical that is normally present in the blood. When the level of uric acid in the blood is too high it can form crystals that deposit in your joints and tissues. This causes joint redness, soreness, and swelling (inflammation). Repeat attacks are common. Over time, uric acid crystals can form into masses (tophi) near a joint, destroying bone and causing disfigurement. Gout is treatable and often preventable. CAUSES  The disease begins with elevated levels of uric acid in the blood. Uric acid is produced by your body when it breaks down a naturally found substance called purines. Certain foods you eat, such as meats and fish, contain high amounts of purines. Causes of an elevated uric acid level include:  Being passed down from parent to child (heredity).  Diseases that cause increased uric acid production (such as obesity, psoriasis, and certain cancers).  Excessive alcohol use.  Diet, especially diets rich in meat and seafood.  Medicines, including certain cancer-fighting medicines (chemotherapy), water pills (diuretics), and aspirin.  Chronic kidney disease. The kidneys are no longer able to remove uric acid well.  Problems with metabolism. Conditions strongly associated with gout include:  Obesity.  High blood pressure.  High cholesterol.  Diabetes. Not everyone with elevated uric acid levels gets gout. It is not understood why some  people get gout and others do not. Surgery, joint injury, and eating too much of certain foods are some of the factors that can lead to gout attacks. SYMPTOMS   An attack of gout comes on quickly. It causes intense pain with redness, swelling, and warmth in a joint.  Fever can occur.  Often, only one joint is involved. Certain joints are more commonly involved:  Base of the big toe.  Knee.  Ankle.  Wrist.  Finger. Without treatment, an attack usually goes away in a few days to weeks. Between attacks, you usually will not have symptoms, which is different from many other forms of arthritis. DIAGNOSIS  Your caregiver will suspect gout based on your symptoms and exam. In some cases, tests may be recommended. The tests may include:  Blood tests.  Urine tests.  X-rays.  Joint fluid exam. This exam requires a needle to remove fluid from the joint (arthrocentesis). Using a microscope, gout is confirmed when uric acid crystals are seen in the joint fluid. TREATMENT  There are two phases to gout treatment: treating the sudden onset (acute) attack and preventing attacks (prophylaxis).  Treatment of an Acute Attack.  Medicines are used. These include anti-inflammatory medicines or steroid medicines.  An injection of steroid medicine into the affected joint is sometimes necessary.  The painful joint is rested. Movement can worsen the arthritis.  You may use warm or cold treatments on painful joints, depending which works best for you.  Treatment to Prevent Attacks.  If you suffer from frequent gout attacks, your caregiver may advise preventive  medicine. These medicines are started after the acute attack subsides. These medicines either help your kidneys eliminate uric acid from your body or decrease your uric acid production. You may need to stay on these medicines for a very long time.  The early phase of treatment with preventive medicine can be associated with an increase in  acute gout attacks. For this reason, during the first few months of treatment, your caregiver may also advise you to take medicines usually used for acute gout treatment. Be sure you understand your caregiver's directions. Your caregiver may make several adjustments to your medicine dose before these medicines are effective.  Discuss dietary treatment with your caregiver or dietitian. Alcohol and drinks high in sugar and fructose and foods such as meat, poultry, and seafood can increase uric acid levels. Your caregiver or dietitian can advise you on drinks and foods that should be limited. HOME CARE INSTRUCTIONS   Do not take aspirin to relieve pain. This raises uric acid levels.  Only take over-the-counter or prescription medicines for pain, discomfort, or fever as directed by your caregiver.  Rest the joint as much as possible. When in bed, keep sheets and blankets off painful areas.  Keep the affected joint raised (elevated).  Apply warm or cold treatments to painful joints. Use of warm or cold treatments depends on which works best for you.  Use crutches if the painful joint is in your leg.  Drink enough fluids to keep your urine clear or pale yellow. This helps your body get rid of uric acid. Limit alcohol, sugary drinks, and fructose drinks.  Follow your dietary instructions. Pay careful attention to the amount of protein you eat. Your daily diet should emphasize fruits, vegetables, whole grains, and fat-free or low-fat milk products. Discuss the use of coffee, vitamin C, and cherries with your caregiver or dietitian. These may be helpful in lowering uric acid levels.  Maintain a healthy body weight. SEEK MEDICAL CARE IF:   You develop diarrhea, vomiting, or any side effects from medicines.  You do not feel better in 24 hours, or you are getting worse. SEEK IMMEDIATE MEDICAL CARE IF:   Your joint becomes suddenly more tender, and you have chills or a fever. MAKE SURE YOU:    Understand these instructions.  Will watch your condition.  Will get help right away if you are not doing well or get worse. Document Released: 10/30/2000 Document Revised: 03/19/2014 Document Reviewed: 06/15/2012 [facility] Patient Information 2015 [place], [place]. This information is not intended to replace advice given to you by your health care provider. Make sure you discuss any questions you have with your health care provider.

## 2015-06-07 NOTE — Assessment & Plan Note (Addendum)
Her sister/caretaker is with her and says this is her usual gout flare and that the hand is often affected.  She has not had a flare in at least a year.  She was previously on Uloric but it looks like she has also been on allopurinol and colchicine in the past.  She has swelling to the first MCP and wrist on left.  Swelling is improving with ibuprofen but still some pain.   Dorsal aspect of hand is TTP.  Radial pulse intact.  No redness, pain is improving with NSAID, patient afebrile w/o behavior change so I doubt infectious etiology. - CBC, BMP, uric acid - I forgot to take patient and caretaker to lab on the way out so they left without labs; I called and left a message to return to clinic if still in the hospital or to come back on Monday for labs if they have already reached home - stop ibuprofen for now - colchicine BID; she will call if colchicine not working - RTC to PCP in 1 month as previously scheduled or sooner if hand pain/swelling do not respond to colchicine

## 2015-06-10 NOTE — Progress Notes (Signed)
Internal Medicine Clinic Attending  Case discussed with Dr. Wilson soon after the resident saw the patient.  We reviewed the resident's history and exam and pertinent patient test results.  I agree with the assessment, diagnosis, and plan of care documented in the resident's note.  

## 2015-06-11 ENCOUNTER — Encounter: Payer: Self-pay | Admitting: *Deleted

## 2015-07-03 ENCOUNTER — Encounter: Payer: Self-pay | Admitting: Podiatry

## 2015-07-03 ENCOUNTER — Ambulatory Visit (INDEPENDENT_AMBULATORY_CARE_PROVIDER_SITE_OTHER): Payer: MEDICARE | Admitting: Podiatry

## 2015-07-03 VITALS — BP 78/54 | HR 57 | Resp 17

## 2015-07-03 DIAGNOSIS — M79676 Pain in unspecified toe(s): Secondary | ICD-10-CM

## 2015-07-03 DIAGNOSIS — B351 Tinea unguium: Secondary | ICD-10-CM | POA: Diagnosis not present

## 2015-07-03 NOTE — Progress Notes (Signed)
Patient ID: Linda Lynn, female   DOB: 1961-07-02, 54 y.o.   MRN: [id number] Complaint:  Visit Type: Patient returns to my office for continued preventative foot care services. Complaint: Patient states" my nails have grown long and thick and become painful to walk and wear shoes" Patient has been diagnosed with DM with no foot complications. The patient presents for preventative foot care services. No changes to ROS  Podiatric Exam: Vascular: dorsalis pedis and posterior tibial pulses are not  palpable bilateral. Capillary return is immediate.  Cold feet noted.  Sensorium: Normal Semmes Weinstein monofilament test. Normal tactile sensation bilaterally. Nail Exam: Pt has thick disfigured discolored nails with subungual debris noted bilateral entire nail hallux  Ulcer Exam: There is no evidence of ulcer or pre-ulcerative changes or infection. Orthopedic Exam: Muscle tone and strength are WNL. No limitations in general ROM. No crepitus or effusions noted. Foot type and digits show no abnormalities. Bony prominences are unremarkable. Skin: No Porokeratosis. No infection or ulcers. Asymptomatic callus heels B/L  Diagnosis:  Onychomycosis, , Pain in right toe, pain in left toes  Treatment & Plan Procedures and Treatment: Consent by patient was obtained for treatment procedures. The patient understood the discussion of treatment and procedures well. All questions were answered thoroughly reviewed. Debridement of mycotic and hypertrophic toenails, 1 through 5 bilateral and clearing of subungual debris. No ulceration, no infection noted.  Return Visit-Office Procedure: Patient instructed to return to the office for a follow up visit 3 months for continued evaluation and treatment.

## 2015-07-11 ENCOUNTER — Encounter: Payer: MEDICARE | Admitting: Internal Medicine

## 2015-07-18 ENCOUNTER — Encounter: Payer: Self-pay | Admitting: Internal Medicine

## 2015-07-18 ENCOUNTER — Ambulatory Visit (INDEPENDENT_AMBULATORY_CARE_PROVIDER_SITE_OTHER): Payer: MEDICARE | Admitting: Internal Medicine

## 2015-07-18 VITALS — BP 126/87 | HR 59 | Temp 98.2°F | Ht <= 58 in | Wt 140.8 lb

## 2015-07-18 DIAGNOSIS — I10 Essential (primary) hypertension: Secondary | ICD-10-CM | POA: Diagnosis not present

## 2015-07-18 DIAGNOSIS — M10032 Idiopathic gout, left wrist: Secondary | ICD-10-CM

## 2015-07-18 DIAGNOSIS — E118 Type 2 diabetes mellitus with unspecified complications: Secondary | ICD-10-CM

## 2015-07-18 DIAGNOSIS — Q909 Down syndrome, unspecified: Secondary | ICD-10-CM | POA: Diagnosis not present

## 2015-07-18 DIAGNOSIS — E119 Type 2 diabetes mellitus without complications: Secondary | ICD-10-CM | POA: Diagnosis not present

## 2015-07-18 DIAGNOSIS — M109 Gout, unspecified: Secondary | ICD-10-CM

## 2015-07-18 DIAGNOSIS — Z0000 Encounter for general adult medical examination without abnormal findings: Secondary | ICD-10-CM

## 2015-07-18 LAB — GLUCOSE, CAPILLARY: Glucose-Capillary: 97 mg/dL (ref 65–99)

## 2015-07-18 LAB — POCT GLYCOSYLATED HEMOGLOBIN (HGB A1C): Hemoglobin A1C: 6.9

## 2015-07-18 MED ORDER — COLCHICINE 0.6 MG PO TABS
0.6000 mg | ORAL_TABLET | Freq: Two times a day (BID) | ORAL | Status: DC
Start: 2015-07-18 — End: 2016-07-28

## 2015-07-18 NOTE — Patient Instructions (Signed)
Ms [facility] [name]),   It seems like your diabetes is back! I understand that you want to work on getting weight back down a little bit and not start medication. Let us meet in 3 months and check your diabetes. If your diabetes number is up still, we will start a medication. Lab Results  Component Value Date   HGBA1C 6.9 07/18/2015   HGBA1C 6.2 01/22/2015   HGBA1C 5.5 07/24/2014   You refused a flu shot today. Please make sure you get it next visit. I have refilled your gout medicine to use if another attack happens. I have ordered a mammogram for you.  Aletta Edouard MD MPH 07/18/2015 11:53 AM [facility] [address] [place], [place] [address]. Ph: [phone] Hours: 8 am - 5 pm

## 2015-07-20 NOTE — Assessment & Plan Note (Signed)
BP Readings from Last 3 Encounters:  07/18/15 126/87  07/03/15 78/54  06/07/15 135/66  Continue current medications.

## 2015-07-20 NOTE — Assessment & Plan Note (Signed)
Refused flu shot this time, would like to get it next visit.

## 2015-07-20 NOTE — Assessment & Plan Note (Signed)
Symptoms resolved.  Colchicine pills given for acute use. #30 If attacks persiit, will consider repeat uric acid levels (when symptoms resolve) and prophylaxis.  Last uric acid 09/25/2014 2.7

## 2015-07-20 NOTE — Progress Notes (Signed)
   Subjective:    Patient ID: Linda Lynn, female    DOB: 11/29/1960, 54 y.o.   MRN: [id number]  HPI Linda Lynn is a 54 year old female with down's syndrome who comes to the clinic for regular follow ups with her sister Linda Lynn. She has a past medical history of diabetes mellitus 2, obesity, hypertension and gout.    Weight issues - Linda Lynn was initially obese and then lost weight because she was put on a diet by her sister. At the time, she was worked up for unintentional weight loss as it was not revealed that she was on a diet. Once it was revealed, we tried to cut back on dieting and she demonstrated weight gain. Last visit we discussed that she might be overshooting in the opposite direction, and Linda Lynn decided that she will restart the weight loss diet again. The patient has lost 10 lbs since last visit. I congratulated her and asked her to maintain a healthy weight.  Diabetes - When the patient was down to a BMI of 27 from 43, we stopped her diabetes meds because of her low A1cs. Today, the patient's A1c has crept back up to 6.7. However sister Linda Lynn thinks she needs to work on diet more before we decide to restart medications.  Gout - Patient had a gout attack after many years and was seen by Linda Lynn last visit for the same. She currently denies any symptoms in the left wrist.    Review of Systems  Respiratory: Negative.   Cardiovascular: Negative.   Endocrine: Negative.   Musculoskeletal: Negative.        Objective:   Physical Exam  Constitutional: She appears well-developed and well-nourished.  Severe mental retardation, The patient responded to me this visit, and answered simple questions Linda Lynn thinks she is beginning to know me after 3 years of continuous visits)  HENT:  Nose: Nose normal.  Eyes:  Corneal opacities. No vision.  Cardiovascular: Normal rate, regular rhythm, normal heart sounds and intact distal pulses.   No murmur heard. Pulmonary/Chest: Effort normal and  breath sounds normal. No respiratory distress. She has no wheezes. She has no rales. She exhibits no tenderness.  Abdominal: Soft. Bowel sounds are normal. There is no tenderness.  Musculoskeletal: She exhibits no edema.  Lymphadenopathy:    She has no cervical adenopathy.  Neurological: She is alert. No cranial nerve deficit. She exhibits normal muscle tone. Coordination normal.  Skin: Skin is warm.    Assessment & Plan:  Please see problem based charting.

## 2015-07-20 NOTE — Assessment & Plan Note (Signed)
Diabetes resurfaced with weight gain.  Wt Readings from Last 3 Encounters:  07/18/15 140 lb 12.8 oz (63.866 kg)  06/07/15 151 lb 11.2 oz (68.811 kg)  01/22/15 139 lb 4.8 oz (63.186 kg)  Patient's sister wants to work on weight loss given encouraging results in the past, before restarting meds. Follow up in 3 months. Reassess A1c and consider starting metformin low dose (patient has had GI upset, loose stools in the past)

## 2015-07-24 NOTE — Addendum Note (Signed)
Addended by: Bufford Spikes on: 07/24/2015 01:43 PM   Modules accepted: Orders

## 2015-08-07 NOTE — Telephone Encounter (Signed)
A user error has taken place: encounter opened in error, closed for administrative reasons.

## 2015-08-08 ENCOUNTER — Telehealth: Payer: Self-pay | Admitting: Internal Medicine

## 2015-08-08 NOTE — Telephone Encounter (Signed)
Pharmacy called and Colcrys is at no cost to pt.  Pharmacist will change.

## 2015-08-08 NOTE — Telephone Encounter (Signed)
Patient sister calling states that patients insurance will not cover medication for gout and wants to get something that is cheaper she doesn't remember the name. Uses [facility] [address]

## 2015-08-26 ENCOUNTER — Encounter: Payer: Self-pay | Admitting: *Deleted

## 2015-10-03 ENCOUNTER — Encounter: Payer: Self-pay | Admitting: Podiatry

## 2015-10-03 ENCOUNTER — Ambulatory Visit (INDEPENDENT_AMBULATORY_CARE_PROVIDER_SITE_OTHER): Payer: MEDICARE | Admitting: Podiatry

## 2015-10-03 DIAGNOSIS — B351 Tinea unguium: Secondary | ICD-10-CM

## 2015-10-03 DIAGNOSIS — M79676 Pain in unspecified toe(s): Secondary | ICD-10-CM

## 2015-10-03 NOTE — Progress Notes (Signed)
Patient ID: Linda Lynn, female   DOB: September 20, 1961, 54 y.o.   MRN: [id number] Complaint:  Visit Type: Patient returns to my office for continued preventative foot care services. Complaint: Patient states" my nails have grown long and thick and become painful to walk and wear shoes" Patient has been diagnosed with DM with no foot complications. The patient presents for preventative foot care services. No changes to ROS  Podiatric Exam: Vascular: dorsalis pedis and posterior tibial pulses are not  palpable bilateral. Capillary return is immediate.  Cold feet noted.  Sensorium: Normal Semmes Weinstein monofilament test. Normal tactile sensation bilaterally. Nail Exam: Pt has thick disfigured discolored nails with subungual debris noted bilateral entire nail hallux  Ulcer Exam: There is no evidence of ulcer or pre-ulcerative changes or infection. Orthopedic Exam: Muscle tone and strength are WNL. No limitations in general ROM. No crepitus or effusions noted. Foot type and digits show no abnormalities. Bony prominences are unremarkable. Skin: No Porokeratosis. No infection or ulcers. Asymptomatic callus heels B/L  Diagnosis:  Onychomycosis, , Pain in right toe, pain in left toes  Treatment & Plan Procedures and Treatment: Consent by patient was obtained for treatment procedures. The patient understood the discussion of treatment and procedures well. All questions were answered thoroughly reviewed. Debridement of mycotic and hypertrophic toenails, 1 through 5 bilateral and clearing of subungual debris. No ulceration, no infection noted.  Return Visit-Office Procedure: Patient instructed to return to the office for a follow up visit 3 months for continued evaluation and treatment.   Helane GuntherGregory Lekesha Claw DPM

## 2015-12-05 ENCOUNTER — Ambulatory Visit: Payer: MEDICARE | Admitting: Internal Medicine

## 2015-12-16 ENCOUNTER — Ambulatory Visit: Payer: MEDICARE | Admitting: Internal Medicine

## 2015-12-20 ENCOUNTER — Emergency Department (INDEPENDENT_AMBULATORY_CARE_PROVIDER_SITE_OTHER): Payer: MEDICARE

## 2015-12-20 ENCOUNTER — Emergency Department (INDEPENDENT_AMBULATORY_CARE_PROVIDER_SITE_OTHER)
Admission: EM | Admit: 2015-12-20 | Discharge: 2015-12-20 | Disposition: A | Discharge status: Home / Self Care | Payer: MEDICARE | Source: Home / Self Care | Attending: Family Medicine | Admitting: Family Medicine

## 2015-12-20 ENCOUNTER — Encounter (HOSPITAL_COMMUNITY): Payer: Self-pay | Admitting: Emergency Medicine

## 2015-12-20 DIAGNOSIS — G8929 Other chronic pain: Secondary | ICD-10-CM

## 2015-12-20 DIAGNOSIS — M25561 Pain in right knee: Secondary | ICD-10-CM

## 2015-12-20 DIAGNOSIS — M179 Osteoarthritis of knee, unspecified: Secondary | ICD-10-CM | POA: Diagnosis not present

## 2015-12-20 MED ORDER — INDOMETHACIN 25 MG PO CAPS: 25.0000 mg | ORAL_CAPSULE | Freq: Three times a day (TID) | ORAL | Status: DC | PRN

## 2015-12-20 NOTE — ED Notes (Signed)
Linda Lynn) brings pt in for right knee pain onset x1 week Reports pt fell inside house of another caregiver  Sx include swelling and pain; brought back in wheel chair A&O x4... No acute distress.

## 2015-12-20 NOTE — ED Provider Notes (Signed)
CSN: [id number]     Arrival date & time 12/20/15  1404 History   First MD Initiated Contact with Patient 12/20/15 1542     Chief Complaint  Patient presents with  . Knee Pain   (Consider location/radiation/quality/duration/timing/severity/associated sxs/prior Treatment) HPI Pt states right knee hurts too much to walk. Sister states patient walked until a few days ago, and now wants to use wheelchair.  No known injury, has used OTC rubs without relief of symptoms.  Past Medical History  Diagnosis Date  . Hypertension   . Diabetes mellitus   . Gout   . 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 Hyperlipidemia    Past Surgical History  Procedure Laterality Date  . Eye surgery  2007    right   Family History  Problem Relation Age of Onset  . Colon cancer Cousin   . Diabetes Sister   . Hypertension Sister   . Diabetes Sister   . Hypertension Sister    Social History  Substance Use Topics  . Smoking status: Never Smoker   . Smokeless tobacco: Never Used  . Alcohol Use: No   OB History    No data available     Review of Systems ROS +'veright knee pain Denies: HEADACHE, NAUSEA, ABDOMINAL PAIN, CHEST PAIN, CONGESTION, DYSURIA, SHORTNESS OF BREATH  Allergies  Review of patient's allergies indicates no known allergies.  Home Medications   Prior to Admission medications   Medication Sig Start Date End Date Taking? Authorizing Provider  Blood Glucose Monitoring Suppl (ONE TOUCH ULTRA MINI) W/DEVICE KIT Check blood sugar before meals and bedtime dx code 250.00 08/01/13   Madilyn Fireman, MD  Cholecalciferol (HM VITAMIN D3) 2000 UNITS CAPS Take 1  Capsule daily. 07/24/14   Madilyn Fireman, MD  colchicine (COLCRYS) 0.6 MG tablet Take 1 tablet (0.6 mg total) by mouth 2 (two) times daily. 07/18/15   Madilyn Fireman, MD  glucose blood (ONE TOUCH ULTRA TEST) test strip Check blood sugar four times daily before meals and bedtime dx code 250.00  08/01/13   Madilyn Fireman, MD  indomethacin (INDOCIN) 25 MG capsule Take 1 capsule (25 mg total) by mouth 3 (three) times daily as needed. 12/20/15   Konrad Felix, PA  latanoprost (XALATAN) 0.005 % ophthalmic solution  01/15/14   Historical Provider, MD  [facility] DELICA [place] 1 each by Does not apply route QID. To check blood sugar four times daily. Dx code 250.00 08/01/13   Madilyn Fireman, MD   Meds Ordered and Administered this Visit  Medications - No data to display  BP 134/75 mmHg  Pulse 82  Temp(Src) 98.1 F (36.7 C) (Oral)  SpO2 100%  LMP 03/02/2008 No data found.   Physical Exam  Constitutional: She appears well-developed and well-nourished.  HENT:  Head: Normocephalic and atraumatic.  Pulmonary/Chest: Effort normal.  Musculoskeletal: She exhibits tenderness.       Right knee: She exhibits swelling. She exhibits normal range of motion, no laceration, no erythema and normal alignment.  Nursing note and vitals reviewed.   ED Course  Procedures (including critical care time)  Labs Review Labs Reviewed - No data to display  Imaging Review Dg Knee Complete 4 Views Right  12/20/2015  CLINICAL DATA:  Diminished range of motion. EXAM: RIGHT KNEE - COMPLETE 4+ VIEW COMPARISON:  11/12/2008 FINDINGS: There is no joint effusion. Moderate tricompartment osteoarthritis is noted. Chondrocalcinosis is identified. No fracture  or subluxation. No radio-opaque foreign bodies or soft tissue calcifications. IMPRESSION: 1. Tricompartment osteoarthritis and chondrocalcinosis. Electronically Signed   By: Kerby Moors M.D.   On: 12/20/2015 16:11     Visual Acuity Review  Right Eye Distance:   Left Eye Distance:   Bilateral Distance:    Right Eye Near:   Left Eye Near:    Bilateral Near:         MDM   1. Knee pain, chronic, right    Patient is advised to continue home symptomatic treatment. Prescription for indocin  sent pharmacy patient has indicated. Patient is  advised that if there are new or worsening symptoms or attend the emergency department, or contact primary care provider. Instructions of care provided discharged home in stable condition. Return to work/school note provided.  THIS NOTE WAS GENERATED USING A VOICE RECOGNITION SOFTWARE PROGRAM. ALL REASONABLE EFFORTS  WERE MADE TO PROOFREAD THIS DOCUMENT FOR ACCURACY.      Konrad Felix, PA 12/20/15 2046

## 2015-12-20 NOTE — Discharge Instructions (Signed)
Heat Therapy Heat therapy can help ease sore, stiff, injured, and tight muscles and joints. Heat relaxes your muscles, which may help ease your pain. Heat therapy should only be used on old, pre-existing, or long-lasting (chronic) injuries. Do not use heat therapy unless told by your doctor. HOW TO USE HEAT THERAPY There are several different kinds of heat therapy, including:  Moist heat pack.  Warm water bath.  Hot water bottle.  Electric heating pad.  Heated gel pack.  Heated wrap.  Electric heating pad. GENERAL HEAT THERAPY RECOMMENDATIONS   Do not sleep while using heat therapy. Only use heat therapy while you are awake.  Your skin may turn pink while using heat therapy. Do not use heat therapy if your skin turns red.  Do not use heat therapy if you have new pain.  High heat or long exposure to heat can cause burns. Be careful when using heat therapy to avoid burning your skin.  Do not use heat therapy on areas of your skin that are already irritated, such as with a rash or sunburn. GET HELP IF:   You have blisters, redness, swelling (puffiness), or numbness.  You have new pain.  Your pain is worse. MAKE SURE YOU:  Understand these instructions.  Will watch your condition.  Will get help right away if you are not doing well or get worse.   This information is not intended to replace advice given to you by your health care provider. Make sure you discuss any questions you have with your health care provider.   Document Released: 01/25/2012 Document Revised: 11/23/2014 Document Reviewed: 12/26/2013 Elsevier Interactive Patient Education 2016 Elsevier Inc. Osteoarthritis Osteoarthritis is a disease that causes soreness and inflammation of a joint. It occurs when the cartilage at the affected joint wears down. Cartilage acts as a cushion, covering the ends of bones where they meet to form a joint. Osteoarthritis is the most common form of arthritis. It often occurs  in older people. The joints affected most often by this condition include those in the:  Ends of the fingers.  Thumbs.  Neck.  Lower back.  Knees.  Hips. CAUSES  Over time, the cartilage that covers the ends of bones begins to wear away. This causes bone to rub on bone, producing pain and stiffness in the affected joints.  RISK FACTORS Certain factors can increase your chances of having osteoarthritis, including:  Older age.  Excessive body weight.  Overuse of joints.  Previous joint injury. SIGNS AND SYMPTOMS   Pain, swelling, and stiffness in the joint.  Over time, the joint may lose its normal shape.  Small deposits of bone (osteophytes) may grow on the edges of the joint.  Bits of bone or cartilage can break off and float inside the joint space. This may cause more pain and damage. DIAGNOSIS  Your health care provider will do a physical exam and ask about your symptoms. Various tests may be ordered, such as:  X-rays of the affected joint.  Blood tests to rule out other types of arthritis. Additional tests may be used to diagnose your condition. TREATMENT  Goals of treatment are to control pain and improve joint function. Treatment plans may include:  A prescribed exercise program that allows for rest and joint relief.  A weight control plan.  Pain relief techniques, such as:  Properly applied heat and cold.  Electric pulses delivered to nerve endings under the skin (transcutaneous electrical nerve stimulation [TENS]).  Massage.  Certain nutritional supplements.  Medicines to control pain, such as:  Acetaminophen.  Nonsteroidal anti-inflammatory drugs (NSAIDs), such as naproxen.  Narcotic or central-acting agents, such as tramadol.  Corticosteroids. These can be given orally or as an injection.  Surgery to reposition the bones and relieve pain (osteotomy) or to remove loose pieces of bone and cartilage. Joint replacement may be needed in advanced  states of osteoarthritis. HOME CARE INSTRUCTIONS   Take medicines only as directed by your health care provider.  Maintain a healthy weight. Follow your health care provider's instructions for weight control. This may include dietary instructions.  Exercise as directed. Your health care provider can recommend specific types of exercise. These may include:  Strengthening exercises. These are done to strengthen the muscles that support joints affected by arthritis. They can be performed with weights or with exercise bands to add resistance.  Aerobic activities. These are exercises, such as brisk walking or low-impact aerobics, that get your heart pumping.  Range-of-motion activities. These keep your joints limber.  Balance and agility exercises. These help you maintain daily living skills.  Rest your affected joints as directed by your health care provider.  Keep all follow-up visits as directed by your health care provider. SEEK MEDICAL CARE IF:   Your skin turns red.  You develop a rash in addition to your joint pain.  You have worsening joint pain.  You have a fever along with joint or muscle aches. SEEK IMMEDIATE MEDICAL CARE IF:  You have a significant loss of weight or appetite.  You have night sweats. FOR MORE INFORMATION   National Institute of Arthritis and Musculoskeletal and Skin Diseases: www.niams.http://www.myers.net/  [employer] on Aging: https://walker.com/  American College of Rheumatology: www.rheumatology.org   This information is not intended to replace advice given to you by your health care provider. Make sure you discuss any questions you have with your health care provider.   Document Released: 11/02/2005 Document Revised: 11/23/2014 Document Reviewed: 07/10/2013 Elsevier Interactive Patient Education 2016 Elsevier Inc. Knee Pain Knee pain is a common problem. It can have many causes. The pain often goes away by following your doctor's home care  instructions. Treatment for ongoing pain will depend on the cause of your pain. If your knee pain continues, more tests may be needed to diagnose your condition. Tests may include X-rays or other imaging studies of your knee. HOME CARE  Take medicines only as told by your doctor.  Rest your knee and keep it raised (elevated) while you are resting.  Do not do things that cause pain or make your pain worse.  Avoid activities where both feet leave the ground at the same time, such as running, jumping rope, or doing jumping jacks.  Apply ice to the knee area:  Put ice in a plastic bag.  Place a towel between your skin and the bag.  Leave the ice on for 20 minutes, 2-3 times a day.  Ask your doctor if you should wear an elastic knee support.  Sleep with a pillow under your knee.  Lose weight if you are overweight. Being overweight can make your knee hurt more.  Do not use any tobacco products, including cigarettes, chewing tobacco, or electronic cigarettes. If you need help quitting, ask your doctor. Smoking may slow the healing of any bone and joint problems that you may have. GET HELP IF:  Your knee pain does not stop, it changes, or it gets worse.  You have a fever along with knee pain.  Your knee gives  out or locks up.  Your knee becomes more swollen. GET HELP RIGHT AWAY IF:   Your knee feels hot to the touch.  You have chest pain or trouble breathing.   This information is not intended to replace advice given to you by your health care provider. Make sure you discuss any questions you have with your health care provider.   Document Released: 01/29/2009 Document Revised: 11/23/2014 Document Reviewed: 01/03/2014 Elsevier Interactive Patient Education [employer]

## 2015-12-27 ENCOUNTER — Encounter: Payer: Self-pay | Admitting: Licensed Clinical Social Worker

## 2015-12-27 NOTE — Progress Notes (Signed)
Ms. Kilgore sister, [name] and nephew present to [facility] requesting to change PCS providers from [facility] to [facility].  Family contacted [facility] and was informed pt would need a new 3051.  Ms. Howdyshell has a scheduled appointment on 12/30/15.  CSW informed family, this worker will f/u with [facility] and confirm if new referral is needed or if pt is in need of a change of pcs provider request.  Per Chestine Spore, pt in need of new referral.  New referral initiated and will forward to PCP/Physician pt is scheduled to see on 12/30/15.

## 2015-12-30 ENCOUNTER — Ambulatory Visit (INDEPENDENT_AMBULATORY_CARE_PROVIDER_SITE_OTHER): Payer: MEDICARE | Admitting: Internal Medicine

## 2015-12-30 ENCOUNTER — Encounter: Payer: Self-pay | Admitting: Internal Medicine

## 2015-12-30 VITALS — BP 149/65 | HR 53 | Wt 150.3 lb

## 2015-12-30 DIAGNOSIS — H0589 Other disorders of orbit: Secondary | ICD-10-CM

## 2015-12-30 DIAGNOSIS — E119 Type 2 diabetes mellitus without complications: Secondary | ICD-10-CM | POA: Diagnosis not present

## 2015-12-30 DIAGNOSIS — E1169 Type 2 diabetes mellitus with other specified complication: Secondary | ICD-10-CM

## 2015-12-30 DIAGNOSIS — I10 Essential (primary) hypertension: Secondary | ICD-10-CM | POA: Diagnosis not present

## 2015-12-30 DIAGNOSIS — E785 Hyperlipidemia, unspecified: Principal | ICD-10-CM

## 2015-12-30 DIAGNOSIS — H179 Unspecified corneal scar and opacity: Secondary | ICD-10-CM | POA: Diagnosis not present

## 2015-12-30 DIAGNOSIS — S0592XD Unspecified injury of left eye and orbit, subsequent encounter: Secondary | ICD-10-CM

## 2015-12-30 DIAGNOSIS — Q909 Down syndrome, unspecified: Secondary | ICD-10-CM | POA: Diagnosis not present

## 2015-12-30 DIAGNOSIS — Z87828 Personal history of other (healed) physical injury and trauma: Secondary | ICD-10-CM

## 2015-12-30 DIAGNOSIS — S0592XA Unspecified injury of left eye and orbit, initial encounter: Secondary | ICD-10-CM | POA: Insufficient documentation

## 2015-12-30 DIAGNOSIS — Z23 Encounter for immunization: Secondary | ICD-10-CM

## 2015-12-30 LAB — POCT GLYCOSYLATED HEMOGLOBIN (HGB A1C): Hemoglobin A1C: 6.5

## 2015-12-30 LAB — GLUCOSE, CAPILLARY: Glucose-Capillary: 78 mg/dL (ref 65–99)

## 2015-12-30 NOTE — Progress Notes (Signed)
Patient ID: Linda Lynn, female   DOB: 11-Jun-1961, 55 y.o.   MRN: [id number]   Subjective:   Patient ID: Linda Lynn female   DOB: 06-03-1961 55 y.o.   MRN: [id number]  HPI: Linda Lynn is a 55 y.o. with PMH listed below, presented for follow up of her DM and HTN. Please see problem based charting for details on problems addressed today.  Past Medical History  Diagnosis Date  . Hypertension   . Diabetes mellitus   . Gout   . 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 Hyperlipidemia    Review of Systems: CONSTITUTIONAL- No Fever, or change in appetite. SKIN- dry skin HEAD- No Headache or dizziness. RESPIRATORY- No Cough or SOB. CARDIAC- No chest pain. GI- No vomiting, diarrhoea, abd pain. URINARY- No Frequency, or dysuria.  Objective:  Physical Exam: Filed Vitals:   12/30/15 0958 12/30/15 1048  BP: 168/85 149/65  Pulse: 64 53  Weight: 150 lb 4.8 oz (68.176 kg)   SpO2: 100%    GENERAL- alert, co-operative, appears as stated age, not in any distress. HEENT- Atraumatic, normocephalic, right pupil- extensive scarring on cornea, left eye- lids closed, appears no eye ball in orbital cavity, oral mucosa appears moist. CARDIAC- RRR, 2/6 systolic murmur, no rubs or gallops. RESP- Moving equal volumes of air, no wheezes or crackles. ABDOMEN- Soft, nontender, bowel sounds present. NEURO- awake , alert, conversing, cannot tell colours, or count number of fingers i held up, in a wheel chair. EXTREMITIES- pulse 2+, symmetric, no pedal edema. SKIN- dry skin PSYCH- answers simple questions  Assessment & Plan:  The patient's case and plan of care was discussed with attending physician, Dr. Criselda Peaches.  Please see problem based charting for assessment and plan.

## 2015-12-30 NOTE — Patient Instructions (Addendum)
You are not on any medications. But it is important that you watch your weight and diet. Reduce salt and take lots of vegetables and Fruits to help with your blood pressure and Diabetes.   Also reducing sweets and high calorie diet will help control your blood sugars.   Calorie Counting for Weight Loss Calories are energy you get from the things you eat and drink. Your body uses this energy to keep you going throughout the day. The number of calories you eat affects your weight. When you eat more calories than your body needs, your body stores the extra calories as fat. When you eat fewer calories than your body needs, your body burns fat to get the energy it needs. Calorie counting means keeping track of how many calories you eat and drink each day. If you make sure to eat fewer calories than your body needs, you should lose weight. In order for calorie counting to work, you will need to eat the number of calories that are right for you in a day to lose a healthy amount of weight per week. A healthy amount of weight to lose per week is usually 1-2 lb (0.5-0.9 kg). A dietitian can determine how many calories you need in a day and give you suggestions on how to reach your calorie goal.  WHAT IS MY MY PLAN? My goal is to have __________ calories per day.  If I have this many calories per day, I should lose around __________ pounds per week. WHAT DO I NEED TO KNOW ABOUT CALORIE COUNTING? In order to meet your daily calorie goal, you will need to:  Find out how many calories are in each food you would like to eat. Try to do this before you eat.  Decide how much of the food you can eat.  Write down what you ate and how many calories it had. Doing this is called keeping a food log. WHERE DO I FIND CALORIE INFORMATION? The number of calories in a food can be found on a Nutrition Facts label. Note that all the information on a label is based on a specific serving of the food. If a food does not have a  Nutrition Facts label, try to look up the calories online or ask your dietitian for help. HOW DO I DECIDE HOW MUCH TO EAT? To decide how much of the food you can eat, you will need to consider both the number of calories in one serving and the size of one serving. This information can be found on the Nutrition Facts label. If a food does not have a Nutrition Facts label, look up the information online or ask your dietitian for help. Remember that calories are listed per serving. If you choose to have more than one serving of a food, you will have to multiply the calories per serving by the amount of servings you plan to eat. For example, the label on a package of bread might say that a serving size is 1 slice and that there are 90 calories in a serving. If you eat 1 slice, you will have eaten 90 calories. If you eat 2 slices, you will have eaten 180 calories. HOW DO I KEEP A FOOD LOG? After each meal, record the following information in your food log:  What you ate.  How much of it you ate.  How many calories it had.  Then, add up your calories. Keep your food log near you, such as in a small  notebook in your pocket. Another option is to use a mobile app or website. Some programs will calculate calories for you and show you how many calories you have left each time you add an item to the log. WHAT ARE SOME CALORIE COUNTING TIPS?  Use your calories on foods and drinks that will fill you up and not leave you hungry. Some examples of this include foods like nuts and nut butters, vegetables, lean proteins, and high-fiber foods (more than 5 g fiber per serving).  Eat nutritious foods and avoid empty calories. Empty calories are calories you get from foods or beverages that do not have many nutrients, such as candy and soda. It is better to have a nutritious high-calorie food (such as an avocado) than a food with few nutrients (such as a bag of chips).  Know how many calories are in the foods you eat  most often. This way, you do not have to look up how many calories they have each time you eat them.  Look out for foods that may seem like low-calorie foods but are really high-calorie foods, such as baked goods, soda, and fat-free candy.  Pay attention to calories in drinks. Drinks such as sodas, specialty coffee drinks, alcohol, and juices have a lot of calories yet do not fill you up. Choose low-calorie drinks like water and diet drinks.  Focus your calorie counting efforts on higher calorie items. Logging the calories in a garden salad that contains only vegetables is less important than calculating the calories in a milk shake.  Find a way of tracking calories that works for you. Get creative. Most people who are successful find ways to keep track of how much they eat in a day, even if they do not count every calorie. WHAT ARE SOME PORTION CONTROL TIPS?  Know how many calories are in a serving. This will help you know how many servings of a certain food you can have.  Use a measuring cup to measure serving sizes. This is helpful when you start out. With time, you will be able to estimate serving sizes for some foods.  Take some time to put servings of different foods on your favorite plates, bowls, and cups so you know what a serving looks like.  Try not to eat straight from a bag or box. Doing this can lead to overeating. Put the amount you would like to eat in a cup or on a plate to make sure you are eating the right portion.  Use smaller plates, glasses, and bowls to prevent overeating. This is a quick and easy way to practice portion control. If your plate is smaller, less food can fit on it.  Try not to multitask while eating, such as watching TV or using your computer. If it is time to eat, sit down at a table and enjoy your food. Doing this will help you to start recognizing when you are full. It will also make you more aware of what and how much you are eating. HOW CAN I CALORIE  COUNT WHEN EATING OUT?  Ask for smaller portion sizes or child-sized portions.  Consider sharing an entree and sides instead of getting your own entree.  If you get your own entree, eat only half. Ask for a box at the beginning of your meal and put the rest of your entree in it so you are not tempted to eat it.  Look for the calories on the menu. If calories are listed, choose  the lower calorie options.  Choose dishes that include vegetables, fruits, whole grains, low-fat dairy products, and lean protein. Focusing on smart food choices from each of the 5 food groups can help you stay on track at restaurants.  Choose items that are boiled, broiled, grilled, or steamed.  Choose water, milk, unsweetened iced tea, or other drinks without added sugars. If you want an alcoholic beverage, choose a lower calorie option. For example, a regular margarita can have up to 700 calories and a glass of wine has around 150.  Stay away from items that are buttered, battered, fried, or served with cream sauce. Items labeled "crispy" are usually fried, unless stated otherwise.  Ask for dressings, sauces, and syrups on the side. These are usually very high in calories, so do not eat much of them.  Watch out for salads. Many people think salads are a healthy option, but this is often not the case. Many salads come with bacon, fried chicken, lots of cheese, fried chips, and dressing. All of these items have a lot of calories. If you want a salad, choose a garden salad and ask for grilled meats or steak. Ask for the dressing on the side, or ask for olive oil and vinegar or lemon to use as dressing.  Estimate how many servings of a food you are given. For example, a serving of cooked rice is  cup or about the size of half a tennis ball or one cupcake wrapper. Knowing serving sizes will help you be aware of how much food you are eating at restaurants. The list below tells you how big or small some common portion sizes  are based on everyday objects.  1 oz--4 stacked dice.  3 oz--1 deck of cards.  1 tsp--1 dice.  1 Tbsp-- a Ping-Pong ball.  2 Tbsp--1 Ping-Pong ball.   cup--1 tennis ball or 1 cupcake wrapper.  1 cup--1 baseball.   This information is not intended to replace advice given to you by your health care provider. Make sure you discuss any questions you have with your health care provider.

## 2015-12-30 NOTE — Assessment & Plan Note (Signed)
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 ?cause. She can not tell colours, is in a wheel chair, sister says pt bumps into things while walking. Pt has Latanoproust ordered for eyes. Also Glaucoma noted in chart. Caregiver wants to know if there is anything that can be done about her eye.   Plan- Will refer pt to follow up with her Ophthalmologist, if there is some vision that can be recovered, this will help with the burden of taking care of pt, as she has Downs.

## 2015-12-30 NOTE — Assessment & Plan Note (Addendum)
Lab Results  Component Value Date   HGBA1C 6.5 12/30/2015   HGBA1C 6.9 07/18/2015   HGBA1C 6.2 01/22/2015     Assessment: Diabetes control:  Controlled Comments: Not on meds Other plans: Has a new care-giver- sister who brought pt in today, wanted to know what medical problems pt had and if she was on medications. She says previous caretaker- also pts sister is ill, requiring a Tracheostomy.  - Counselled on diet, weight. - Microalbuminuria next visit - Foot exam today

## 2015-12-30 NOTE — Assessment & Plan Note (Signed)
BP Readings from Last 3 Encounters:  12/30/15 149/65  12/20/15 134/75  07/18/15 126/87    Lab Results  Component Value Date   NA 142 02/27/2014   K 4.2 02/27/2014   CREATININE 0.91 02/27/2014    Assessment: Blood pressure control:  Acceptable, not on meds. Other plans: Counselled on diet, salt.
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
## 2016-01-02 ENCOUNTER — Encounter: Payer: Self-pay | Admitting: Podiatry

## 2016-01-02 ENCOUNTER — Ambulatory Visit (INDEPENDENT_AMBULATORY_CARE_PROVIDER_SITE_OTHER): Payer: MEDICARE | Admitting: Podiatry

## 2016-01-02 DIAGNOSIS — B351 Tinea unguium: Secondary | ICD-10-CM

## 2016-01-02 DIAGNOSIS — M79676 Pain in unspecified toe(s): Secondary | ICD-10-CM

## 2016-01-02 DIAGNOSIS — E1149 Type 2 diabetes mellitus with other diabetic neurological complication: Secondary | ICD-10-CM

## 2016-01-02 DIAGNOSIS — L84 Corns and callosities: Secondary | ICD-10-CM

## 2016-01-02 DIAGNOSIS — M2010 Hallux valgus (acquired), unspecified foot: Secondary | ICD-10-CM

## 2016-01-02 NOTE — Progress Notes (Signed)
Patient ID: Linda Lynn, female   DOB: 03/15/1961, 55 y.o.   MRN: [id number] Complaint:  Visit Type: Patient returns to my office for continued preventative foot care services. Complaint: Patient states" my nails have grown long and thick and become painful to walk and wear shoes" Patient has been diagnosed with DM with no foot complications. The patient presents for preventative foot care services. No changes to ROS.  Patient family requests diabetic shoes.  Podiatric Exam: Vascular: dorsalis pedis and posterior tibial pulses are not  palpable bilateral. Capillary return is immediate.  Cold feet noted.  Sensorium: Normal Semmes Weinstein monofilament test. Normal tactile sensation bilaterally. Nail Exam: Pt has thick disfigured discolored nails with subungual debris noted bilateral entire nail hallux  Ulcer Exam: There is no evidence of ulcer or pre-ulcerative changes or infection. Orthopedic Exam: Muscle tone and strength are WNL. No limitations in general ROM. No crepitus or effusions noted. Foot type and digits show no abnormalities. Bony prominences are unremarkable. Skin: No Porokeratosis. No infection or ulcers. Asymptomatic callus heels B/L  Diagnosis:  Onychomycosis, , Pain in right toe, pain in left toes  Treatment & Plan Procedures and Treatment: Consent by patient was obtained for treatment procedures. The patient understood the discussion of treatment and procedures well. All questions were answered thoroughly reviewed. Debridement of mycotic and hypertrophic toenails, 1 through 5 bilateral and clearing of subungual debris. No ulceration, no infection noted. Initiate diabetic shoe paperwork for HAV deformities B/L and preulcerous callus heels B/L. Return Visit-Office Procedure: Patient instructed to return to the office for a follow up visit 3 months for continued evaluation and treatment.   Essa Wenk DPM   Grayer Sproles DPM 

## 2016-01-04 NOTE — Progress Notes (Signed)
Internal Medicine Clinic Attending  Case discussed with Dr. Emokpae soon after the resident saw the patient.  We reviewed the resident's history and exam and pertinent patient test results.  I agree with the assessment, diagnosis, and plan of care documented in the resident's note. 

## 2016-02-18 ENCOUNTER — Telehealth: Payer: Self-pay | Admitting: Podiatry

## 2016-02-18 NOTE — Telephone Encounter (Signed)
Left voicemail for Shayne AlkenDorothy Young to call office to schedule appt. For Merton BorderAngela Aslin

## 2016-02-25 ENCOUNTER — Ambulatory Visit: Payer: MEDICARE | Admitting: *Deleted

## 2016-02-25 DIAGNOSIS — E1149 Type 2 diabetes mellitus with other diabetic neurological complication: Secondary | ICD-10-CM

## 2016-02-25 NOTE — Progress Notes (Signed)
Patient ID: Linda Lynn, female   DOB: 1961-01-25, 55 y.o.   MRN: [id number] Patient presents to be molded and measured for diabetic shoes and inserts.

## 2016-03-26 ENCOUNTER — Encounter: Payer: Self-pay | Admitting: Podiatry

## 2016-03-26 ENCOUNTER — Ambulatory Visit (INDEPENDENT_AMBULATORY_CARE_PROVIDER_SITE_OTHER): Payer: MEDICARE | Admitting: Podiatry

## 2016-03-26 DIAGNOSIS — E1149 Type 2 diabetes mellitus with other diabetic neurological complication: Secondary | ICD-10-CM | POA: Diagnosis not present

## 2016-03-26 DIAGNOSIS — M79676 Pain in unspecified toe(s): Secondary | ICD-10-CM

## 2016-03-26 DIAGNOSIS — M2010 Hallux valgus (acquired), unspecified foot: Secondary | ICD-10-CM

## 2016-03-26 DIAGNOSIS — B351 Tinea unguium: Secondary | ICD-10-CM

## 2016-03-26 DIAGNOSIS — L84 Corns and callosities: Secondary | ICD-10-CM

## 2016-03-26 NOTE — Progress Notes (Signed)
Patient ID: Linda Lynn, female   DOB: 07/17/1961, 55 y.o.   MRN: [id number] Complaint:  Visit Type: Patient returns to my office for continued preventative foot care services. Complaint: Patient states" my nails have grown long and thick and become painful to walk and wear shoes" Patient has been diagnosed with DM with no foot complications. The patient presents for preventative foot care services. No changes to ROS.  Patient family requests diabetic shoes.  Podiatric Exam: Vascular: dorsalis pedis and posterior tibial pulses are not  palpable bilateral. Capillary return is immediate.  Cold feet noted.  Sensorium: Normal Semmes Weinstein monofilament test. Normal tactile sensation bilaterally. Nail Exam: Pt has thick disfigured discolored nails with subungual debris noted bilateral entire nail hallux  Ulcer Exam: There is no evidence of ulcer or pre-ulcerative changes or infection. Orthopedic Exam: Muscle tone and strength are WNL. No limitations in general ROM. No crepitus or effusions noted. Foot type and digits show no abnormalities. Bony prominences are unremarkable. Skin: No Porokeratosis. No infection or ulcers. Asymptomatic callus heels B/L  Diagnosis:  Onychomycosis, , Pain in right toe, pain in left toes  Treatment & Plan Procedures and Treatment: Consent by patient was obtained for treatment procedures. The patient understood the discussion of treatment and procedures well. All questions were answered thoroughly reviewed. Debridement of mycotic and hypertrophic toenails, 1 through 5 bilateral and clearing of subungual debris. No ulceration, no infection noted. Initiate diabetic shoe paperwork for HAV deformities B/L and preulcerous callus heels B/L. Return Visit-Office Procedure: Patient instructed to return to the office for a follow up visit 3 months for continued evaluation and treatment.   Jameika Kinn DPM   Thelmer Legler DPM 

## 2016-04-06 ENCOUNTER — Encounter: Payer: Self-pay | Admitting: Student in an Organized Health Care Education/Training Program

## 2016-04-06 ENCOUNTER — Ambulatory Visit (INDEPENDENT_AMBULATORY_CARE_PROVIDER_SITE_OTHER): Payer: MEDICARE | Admitting: Student in an Organized Health Care Education/Training Program

## 2016-04-06 VITALS — BP 130/74 | HR 66 | Temp 97.8°F | Ht <= 58 in | Wt 151.7 lb

## 2016-04-06 DIAGNOSIS — E785 Hyperlipidemia, unspecified: Secondary | ICD-10-CM | POA: Diagnosis not present

## 2016-04-06 DIAGNOSIS — I10 Essential (primary) hypertension: Secondary | ICD-10-CM | POA: Diagnosis not present

## 2016-04-06 DIAGNOSIS — Q909 Down syndrome, unspecified: Secondary | ICD-10-CM | POA: Diagnosis not present

## 2016-04-06 DIAGNOSIS — H547 Unspecified visual loss: Secondary | ICD-10-CM | POA: Insufficient documentation

## 2016-04-06 DIAGNOSIS — E119 Type 2 diabetes mellitus without complications: Secondary | ICD-10-CM | POA: Diagnosis not present

## 2016-04-06 DIAGNOSIS — E1169 Type 2 diabetes mellitus with other specified complication: Secondary | ICD-10-CM

## 2016-04-06 DIAGNOSIS — E784 Other hyperlipidemia: Secondary | ICD-10-CM | POA: Diagnosis not present

## 2016-04-06 DIAGNOSIS — Z1239 Encounter for other screening for malignant neoplasm of breast: Secondary | ICD-10-CM

## 2016-04-06 LAB — GLUCOSE, CAPILLARY: GLUCOSE-CAPILLARY: 146 mg/dL — AB (ref 65–99)

## 2016-04-06 LAB — POCT GLYCOSYLATED HEMOGLOBIN (HGB A1C): HEMOGLOBIN A1C: 6.9

## 2016-04-06 NOTE — Patient Instructions (Signed)

## 2016-04-06 NOTE — Assessment & Plan Note (Signed)
Started a stain break in 2015 because of improved lipid panel, likely due to weight loss and healthier eating. Plan to recheck lipids today and determine if we should restart pravastatin.

## 2016-04-06 NOTE — Assessment & Plan Note (Addendum)
Diet controlled. Has been on insulin in the past. Goal A1c is less than 7.0% and it is 6.9% today. I told her caretaker to continue her healthy diet, avoid sugar, no need for home glucose monitoring, we will recheck her A1c in 3-6 months. We check urine microalbumin today to look for proteinuria.

## 2016-04-06 NOTE — Assessment & Plan Note (Signed)
Mr. diagnosis, has been on HCTZ in the past. But pressure is excellent today in clinic. No need to start antihypertensives.

## 2016-04-06 NOTE — Addendum Note (Signed)
Addended by: Erlinda HongVINCENT, Alizae Bechtel T on: 04/06/2016 01:42 PM   Modules accepted: Orders, SmartSet

## 2016-04-06 NOTE — Progress Notes (Signed)
   See Encounters tab for problem-based medical decision making  __________________________________________________________  HPI:  55 year old woman with Down syndrome presents for follow-up of type 2 diabetes. The patient has profound cognitive delay and requires 24-hour care for almost all activities of daily living. She was living with her sister but in March her sister passed away and she moved in with her other sister. She has a caretaker who is her niece who comes in sits with her during the day while her sister goes to work. He reports things are going well at home. The patient is able to do a few things for herself like pressure teeth and go to the bathroom but with direct supervision. The patient has no complaints today. Only medication they're giving her is a vitamin D supplement. The report giving her a healthy diet with a lot of salads, vegetables, and fruits. They did endorse that she has flatus throughout the day, has a bowel movement about every 2 days. Say her weight has been stable. She was able to adjust to the change in her living situation well. They're also wondering if there are occupational therapy or physical therapy options for her to give her more activities and more skills during the day.  __________________________________________________________  Problem List: Patient Active Problem List   Diagnosis Date Noted  . Breast cancer screening 04/06/2016  . Blindness 04/06/2016  . Unspecified vitamin D deficiency 07/24/2014  . Hyperlipidemia associated with type 2 diabetes mellitus (HCC) 04/20/2012  . Healthcare maintenance 08/06/2011  . Gout 08/06/2010  . Diabetes (HCC) 09/25/2006  . Essential hypertension 09/25/2006  . Down's syndrome 09/25/2006    Medications: Reconciled today in Epic __________________________________________________________  Physical Exam:  Vital Signs: Filed Vitals:   04/06/16 0931 04/06/16 0933  BP: 130/74   Pulse: 66   Temp: 97.8 F  (36.6 C)   TempSrc: Oral   Height:  4\' 8"  (1.422 m)  Weight: 151 lb 11.2 oz (68.811 kg)   SpO2: 100%     Gen: Well appearing, NAD ENT: Right eye large cataract, left eye enucleation CV: RRR, no murmurs Pulm: Normal effort, CTA throughout, no wheezing Abd: Soft, NT, ND, easily reducible umbilical hernia Ext: Warm, no edema, normal joints Skin: No atypical appearing moles. No rashes

## 2016-04-07 ENCOUNTER — Ambulatory Visit (INDEPENDENT_AMBULATORY_CARE_PROVIDER_SITE_OTHER): Payer: MEDICARE | Admitting: Podiatry

## 2016-04-07 ENCOUNTER — Telehealth: Payer: Self-pay | Admitting: Licensed Clinical Social Worker

## 2016-04-07 DIAGNOSIS — M2012 Hallux valgus (acquired), left foot: Secondary | ICD-10-CM | POA: Diagnosis not present

## 2016-04-07 DIAGNOSIS — M2011 Hallux valgus (acquired), right foot: Secondary | ICD-10-CM

## 2016-04-07 DIAGNOSIS — M2010 Hallux valgus (acquired), unspecified foot: Secondary | ICD-10-CM

## 2016-04-07 DIAGNOSIS — E1149 Type 2 diabetes mellitus with other diabetic neurological complication: Secondary | ICD-10-CM

## 2016-04-07 DIAGNOSIS — L84 Corns and callosities: Secondary | ICD-10-CM

## 2016-04-07 LAB — BMP8+ANION GAP
ANION GAP: 18 mmol/L (ref 10.0–18.0)
BUN / CREAT RATIO: 26 — AB (ref 9–23)
BUN: 26 mg/dL — AB (ref 6–24)
CO2: 25 mmol/L (ref 18–29)
CREATININE: 0.99 mg/dL (ref 0.57–1.00)
Calcium: 8.9 mg/dL (ref 8.7–10.2)
Chloride: 94 mmol/L — ABNORMAL LOW (ref 96–106)
GFR calc non Af Amer: 64 mL/min/{1.73_m2} (ref >59)
GFR, EST AFRICAN AMERICAN: 74 mL/min/{1.73_m2} (ref >59)
GLUCOSE: 158 mg/dL — AB (ref 65–99)
Potassium: 3.6 mmol/L (ref 3.5–5.2)
Sodium: 137 mmol/L (ref 134–144)

## 2016-04-07 LAB — LIPID PANEL
CHOLESTEROL TOTAL: 251 mg/dL — AB (ref 100–199)
Chol/HDL Ratio: 3.5 ratio units (ref 0.0–4.4)
HDL: 71 mg/dL (ref >39)
LDL CALC: 163 mg/dL — AB (ref 0–99)
Triglycerides: 86 mg/dL (ref 0–149)
VLDL CHOLESTEROL CAL: 17 mg/dL (ref 5–40)

## 2016-04-07 LAB — MICROALBUMIN / CREATININE URINE RATIO
CREATININE, UR: 128.4 mg/dL
MICROALB/CREAT RATIO: 42.5 mg/g creat — ABNORMAL HIGH (ref 0.0–30.0)
MICROALBUM., U, RANDOM: 54.6 ug/mL

## 2016-04-07 NOTE — Patient Instructions (Signed)

## 2016-04-07 NOTE — Telephone Encounter (Signed)
CSW placed called to pt to obtain pt's current services and resources.  CSW left message requesting return call. CSW provided contact hours and phone number.   CSW sent inquiry to [facility] regarding educational resources in the community for teaching ADL's.  CSW will also inquire with [employer], after speaking with pt/family.

## 2016-04-07 NOTE — Progress Notes (Signed)
Patient ID: Linda Lynn, female   DOB: November 13, 1961, 55 y.o.   MRN: [id number] Patient presents for diabetic shoe pick up, shoes are tried on for good fit.  Patient received 1 Pair New Balance [id number] Blue and white in women's size 5.5 wide and 3 pairs custom molded diabetic inserts with a left heel unload.  Verbal and written break in and wear instructions given.  Patient will follow up for scheduled routine care.   Podiatric Exam: Vascular: dorsalis pedis and posterior tibial pulses are not palpable bilateral. Capillary return is immediate. Cold feet noted.  Sensorium: Normal Semmes Weinstein monofilament test. Normal tactile sensation bilaterally. Nail Exam: Pt has thick disfigured discolored nails with subungual debris noted bilateral entire nail hallux  Ulcer Exam: There is no evidence of ulcer or pre-ulcerative changes or infection. Orthopedic Exam: Muscle tone and strength are WNL. No limitations in general ROM. No crepitus or effusions noted. Foot type is pes planus.  HAV  B/L Skin: No Porokeratosis. No infection or ulcers. Asymptomatic callus heels B/L  Diabetic neuropathy  HAV  B/L  Pes planus  DM with angiopathy.   TX  Dispense diabetic shoes.  Patient presents today and was dispensed 0ne pair ( two units) of medically necessary extra depth shoes with three pair( six units) of custom molded multiple density inserts. The shoes and the inserts are fitted to the patients ' feet and are noted to fit well and are free of defect.  Length and width of the shoes are also acceptable.  Patient was given written and verbal  instructions for wearing.  If any concerns arrive with the shoes or inserts, the patient is to call the office.Patient is to follow up with doctor in six weeks.  Helane GuntherGregory Mayer DPM

## 2016-04-07 NOTE — Telephone Encounter (Signed)
I talked with the patient's sister about lab results. Cholesterol is elevated at 251. 10 year ASCVD risk is 7.7%, right on the borderline to treat. I offered treatment or watchful waiting. Sister would prefer to try more diet modification before restarting Pravastatin. Because of her cognitive delay, taking medications is burdensome for the patient. I think this is fine, we can recheck a lipid panel again in 1 year.

## 2016-06-02 ENCOUNTER — Encounter: Payer: Self-pay | Admitting: Licensed Clinical Social Worker

## 2016-06-25 ENCOUNTER — Ambulatory Visit (INDEPENDENT_AMBULATORY_CARE_PROVIDER_SITE_OTHER): Payer: MEDICARE | Admitting: Podiatry

## 2016-06-25 ENCOUNTER — Encounter: Payer: Self-pay | Admitting: Podiatry

## 2016-06-25 DIAGNOSIS — E1149 Type 2 diabetes mellitus with other diabetic neurological complication: Secondary | ICD-10-CM | POA: Diagnosis not present

## 2016-06-25 DIAGNOSIS — M79676 Pain in unspecified toe(s): Secondary | ICD-10-CM

## 2016-06-25 DIAGNOSIS — B351 Tinea unguium: Secondary | ICD-10-CM

## 2016-06-25 NOTE — Progress Notes (Signed)
Patient ID: Linda Lynn, female   DOB: 1961/04/10, 55 y.o.   MRN: [id number] Complaint:  Visit Type: Patient returns to my office for continued preventative foot care services. Complaint: Patient states" my nails have grown long and thick and become painful to walk and wear shoes" Patient has been diagnosed with DM with no foot complications. The patient presents for preventative foot care services. No changes to ROS.  Patient family requests diabetic shoes.  Podiatric Exam: Vascular: dorsalis pedis and posterior tibial pulses are not  palpable bilateral. Capillary return is immediate.  Cold feet noted.  Sensorium: Normal Semmes Weinstein monofilament test. Normal tactile sensation bilaterally. Nail Exam: Pt has thick disfigured discolored nails with subungual debris noted bilateral entire nail hallux  Ulcer Exam: There is no evidence of ulcer or pre-ulcerative changes or infection. Orthopedic Exam: Muscle tone and strength are WNL. No limitations in general ROM. No crepitus or effusions noted. Foot type and digits show no abnormalities. Bony prominences are unremarkable. Skin: No Porokeratosis. No infection or ulcers. Asymptomatic callus heels B/L  Diagnosis:  Onychomycosis, , Pain in right toe, pain in left toes  Treatment & Plan Procedures and Treatment: Consent by patient was obtained for treatment procedures. The patient understood the discussion of treatment and procedures well. All questions were answered thoroughly reviewed. Debridement of mycotic and hypertrophic toenails, 1 through 5 bilateral and clearing of subungual debris. No ulceration, no infection noted. Initiate diabetic shoe paperwork for HAV deformities B/L and preulcerous callus heels B/L. Return Visit-Office Procedure: Patient instructed to return to the office for a follow up visit 3 months for continued evaluation and treatment.   Linda Lynn DPM   Linda Lynn DPM

## 2016-07-27 ENCOUNTER — Telehealth: Payer: Self-pay | Admitting: Student in an Organized Health Care Education/Training Program

## 2016-07-27 NOTE — Telephone Encounter (Signed)
APT. REMINDER CALL, NO ANSWER, NO VOICEMAIL °

## 2016-07-28 ENCOUNTER — Ambulatory Visit (INDEPENDENT_AMBULATORY_CARE_PROVIDER_SITE_OTHER): Payer: Medicaid Other | Admitting: Internal Medicine

## 2016-07-28 VITALS — BP 118/84 | HR 91 | Temp 98.0°F | Ht <= 58 in | Wt 162.1 lb

## 2016-07-28 DIAGNOSIS — Z23 Encounter for immunization: Secondary | ICD-10-CM

## 2016-07-28 DIAGNOSIS — R6889 Other general symptoms and signs: Secondary | ICD-10-CM

## 2016-07-28 DIAGNOSIS — Z0000 Encounter for general adult medical examination without abnormal findings: Secondary | ICD-10-CM

## 2016-07-28 DIAGNOSIS — Q909 Down syndrome, unspecified: Secondary | ICD-10-CM | POA: Diagnosis not present

## 2016-07-28 DIAGNOSIS — I10 Essential (primary) hypertension: Secondary | ICD-10-CM | POA: Diagnosis not present

## 2016-07-28 DIAGNOSIS — E119 Type 2 diabetes mellitus without complications: Secondary | ICD-10-CM | POA: Diagnosis not present

## 2016-07-28 DIAGNOSIS — R197 Diarrhea, unspecified: Secondary | ICD-10-CM | POA: Diagnosis not present

## 2016-07-28 DIAGNOSIS — A084 Viral intestinal infection, unspecified: Secondary | ICD-10-CM

## 2016-07-28 NOTE — Assessment & Plan Note (Signed)
Flu shot was given today 

## 2016-07-28 NOTE — Patient Instructions (Signed)
It was pleasure taking care of a you today. She might be under the influence of whether or having some viral gastroenteritis. Please bring her back if she develops fever, nausea or vomiting, abdominal pain or generalized aches and pains. She does not required any treatment at this time. You can try some over-the-counter Tylenol if needed. We will give her flu shot today.

## 2016-07-28 NOTE — Progress Notes (Signed)
I saw and evaluated the patient.  I personally confirmed the key portions of Dr. Amin's history and exam and reviewed pertinent patient test results.  The assessment, diagnosis, and plan were formulated together and I agree with the documentation in the resident's note. 

## 2016-07-28 NOTE — Assessment & Plan Note (Signed)
BP Readings from Last 3 Encounters:  07/28/16 118/84  04/06/16 130/74  12/30/15 (!) 149/65  She is normotensive, no need to restart therapy.

## 2016-07-28 NOTE — Assessment & Plan Note (Signed)
She brought to the clinic today by her niece who is also her daytime caregiver, with complaint of headache mild sniffing and frequent clearing of her throat yesterday. Headache resolved without any medicine. She also complained of one episode of diarrhea this morning, during which the patient became incontinent. She denies any fever, chills, nasal congestion, productive cough. No aches and pains, no nausea or vomiting or abdominal pain. She denies  any sick contacts. Her physical exam is benign, does not show any pharyngeal or nasal exudate or edema. She just had one episode of diarrhea without any accompanied abdominal pain, nausea or vomiting. She is not taking any medicine except vitamin D. She might be having viral gastroenteritis although no history of sick contact.  We told caregiver to feed her normally and watch for any more episodes of diarrhea, nausea or vomiting or development of fever. She does not required any treatment at this time.

## 2016-07-28 NOTE — Assessment & Plan Note (Signed)
Currently diet controlled. Her recent A1c done in May 2017 was 6.9. We will repeat A1c during her next visit.

## 2016-07-28 NOTE — Progress Notes (Signed)
   CC: One episode of diarrhea this morning.         Some sniffing and cough since yesterday. HPI:  [name] is a 55 y.o. with history of Down syndrome, dependent on caregiver for her daily activities, brought to the clinic today by her niece who is also her daytime caregiver, with complaint of headache mild sniffing and frequent clearing of her throat yesterday. Headache resolved without any medicine. She also complained of one episode of diarrhea this morning, during which the patient became incontinent. She denies any fever, chills, nasal congestion, productive cough. No aches and pains, no nausea or vomiting or abdominal pain. She denies  any sick contacts. Patient was feeling hungry, caregiver states that they never affected her this morning after that one episode of diarrhea and wants to be checked up before.  Past Medical History:  Diagnosis Date  . Diabetes mellitus   . Gout   . Hyperlipidemia   . Hypertension   . 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  Patient is well-developed and well-nourished, in no acute distress and cooperative with exam.  HEENT:No pharyngeal erythema or exudate, no nasal exudate. No sinus tenderness. Cardiovascular: RRR, S1 normal, S2 normal, no murmurs, gallops, or rubs. Pulmonary/Chest: Clear to auscultation bilaterally, no wheezes, rales, or rhonchi. Abdominal: Soft, non-tender, non-distended, BS +, no masses, organomegaly, or guarding present.  Extremities: No lower extremity edema bilaterally,   pulses symmetric and intact bilaterally. No cyanosis or clubbing.   Assessment & Plan:   See Encounters Tab for problem based charting.  Patient seen with Dr. Josem KaufmannKlima

## 2016-08-04 DIAGNOSIS — Z970 Presence of artificial eye: Secondary | ICD-10-CM | POA: Diagnosis not present

## 2016-08-04 DIAGNOSIS — H401113 Primary open-angle glaucoma, right eye, severe stage: Secondary | ICD-10-CM | POA: Diagnosis not present

## 2016-08-04 LAB — HM DIABETES EYE EXAM

## 2016-09-28 ENCOUNTER — Encounter: Payer: Self-pay | Admitting: Student in an Organized Health Care Education/Training Program

## 2016-09-28 ENCOUNTER — Ambulatory Visit (INDEPENDENT_AMBULATORY_CARE_PROVIDER_SITE_OTHER): Payer: MEDICARE | Admitting: Student in an Organized Health Care Education/Training Program

## 2016-09-28 VITALS — BP 122/72 | HR 103 | Temp 98.1°F | Ht <= 58 in | Wt 154.6 lb

## 2016-09-28 DIAGNOSIS — E1165 Type 2 diabetes mellitus with hyperglycemia: Secondary | ICD-10-CM

## 2016-09-28 DIAGNOSIS — H547 Unspecified visual loss: Secondary | ICD-10-CM

## 2016-09-28 DIAGNOSIS — Z8739 Personal history of other diseases of the musculoskeletal system and connective tissue: Secondary | ICD-10-CM | POA: Diagnosis not present

## 2016-09-28 DIAGNOSIS — N179 Acute kidney failure, unspecified: Secondary | ICD-10-CM | POA: Diagnosis not present

## 2016-09-28 DIAGNOSIS — F78 Other intellectual disabilities: Secondary | ICD-10-CM

## 2016-09-28 DIAGNOSIS — E119 Type 2 diabetes mellitus without complications: Secondary | ICD-10-CM

## 2016-09-28 DIAGNOSIS — I10 Essential (primary) hypertension: Secondary | ICD-10-CM

## 2016-09-28 DIAGNOSIS — Q909 Down syndrome, unspecified: Secondary | ICD-10-CM

## 2016-09-28 DIAGNOSIS — M1A0790 Idiopathic chronic gout, unspecified ankle and foot, without tophus (tophi): Secondary | ICD-10-CM

## 2016-09-28 LAB — BASIC METABOLIC PANEL
ANION GAP: 11 (ref 5–15)
BUN: 29 mg/dL — ABNORMAL HIGH (ref 6–20)
CO2: 29 mmol/L (ref 22–32)
Calcium: 9.5 mg/dL (ref 8.9–10.3)
Chloride: 102 mmol/L (ref 101–111)
Creatinine, Ser: 1.41 mg/dL — ABNORMAL HIGH (ref 0.44–1.00)
GFR calc Af Amer: 48 mL/min — ABNORMAL LOW (ref >60)
GFR, EST NON AFRICAN AMERICAN: 41 mL/min — AB (ref >60)
GLUCOSE: 663 mg/dL — AB (ref 65–99)
POTASSIUM: 4.5 mmol/L (ref 3.5–5.1)
SODIUM: 142 mmol/L (ref 135–145)

## 2016-09-28 LAB — GLUCOSE, CAPILLARY: Glucose-Capillary: >600 mg/dL (ref 65–99)

## 2016-09-28 LAB — POCT GLYCOSYLATED HEMOGLOBIN (HGB A1C): HEMOGLOBIN A1C: 12.8

## 2016-09-28 MED ORDER — FREESTYLE SYSTEM KIT: 1.0000 | PACK | 0 refills | Status: DC | PRN

## 2016-09-28 MED ORDER — INSULIN ISOPHANE & REGULAR (HUMAN 70-30)100 UNIT/ML KWIKPEN: 10.0000 [IU] | PEN_INJECTOR | Freq: Two times a day (BID) | SUBCUTANEOUS | 3 refills | Status: DC

## 2016-09-28 MED ORDER — METFORMIN HCL 500 MG PO TABS: 500.0000 mg | ORAL_TABLET | Freq: Two times a day (BID) | ORAL | 5 refills | Status: DC

## 2016-09-28 MED ORDER — "INSULIN SYRINGE-NEEDLE U-100 31G X 5/16"" 0.5 ML MISC": 10.0000 [IU] | Freq: Two times a day (BID) | 3 refills | Status: DC

## 2016-09-28 NOTE — Assessment & Plan Note (Signed)
No flares since her last visit. No indication for prophylaxis right now. She historically responds well to high-dose NSAIDs during flares.

## 2016-09-28 NOTE — Assessment & Plan Note (Addendum)
Diabetes historically has been well-controlled  without medications, rather with just diet control. In the remote past she required insulin therapy. Today she comes in with her blood sugars wildly out of control. Hemoglobin A1c jumped from 6.9% to 12.8%. She has signs of mild volume depletion on exam. Basic metabolic panel today shows a spot glucose of 660, mild acute kidney injury, no acidosis or anion gap. Clinically she is doing well with no complaints, she's eating and drinking well, drinks several glasses of water in our office. I don't see an indication for admission right now, I think we can manage this as an outpatient. I think her diabetes is out of control because of the change in diet that came from moving in with her sister, now having lots of juice, soda, fast food, etc. Plan is to start metformin 500mg  bid (GFR still ok) and intermediate acting insulin 10 units bid. Sister understands to push oral fluids today and tomorrow, and strictly limit sugar intake to less than 30g per day. Return for lab recheck in 2 days. Sister will call and follow up with Linda Lynn if there is any change in Linda Lynn's symptoms or behavior. I don't anticipate we will need insulin forever, but I would like to use insulin now because it is familiar to Linda Landngela and her sister Linda Lynn, and we need to stave off her developing HHS.

## 2016-09-28 NOTE — Patient Instructions (Signed)
1. Blood sugars are out of control.   2. We will start insulin injections, 10 units in the morning and 10 units in the evening. Hold insulin if she does not eat. Check blood sugar 1-2 times a day please.

## 2016-09-28 NOTE — Assessment & Plan Note (Signed)
Blood pressure well controlled today off medications. Haven't seen any elevated pressures since about February 2017. We will continue to watch this at each follow-up visit.

## 2016-09-28 NOTE — Progress Notes (Signed)
Assessment and Plan:  See Encounters tab for problem-based medical decision making.   __________________________________________________________  HPI:  55 year old woman comes in today for follow-up of diabetes. The patient has Down syndrome with significant mental delay and chronic disability due to being blind. Previously she was living with her sister until her sister's death about 1 year ago. She has now moved in with another sister, Shayne AlkenDorothy Young, who accompanies her on visit today. [name] works a full-time job and the patient's niece stays with her during the day. [name] tells me that the niece gives [name] whatever food she wants, this often includes soda, fast food, and juices. She reports thinking that she is gaining some weight now. Otherwise she says that the patient has been in her usual state of health. Denies any new complaints like nausea, vomiting, pain. No recent illnesses. The patient denies pain anywhere, no dysuria. She does feel thirsty. No chest pain, PND, orthopnea. Currently she takes no medications regularly. She does have diabetes but historically this has been well-controlled with A1c under 7% without medications. [name] tells me that there may have been food insecurity in her last living situation, she felt like she was not eating that much and her foods were of low quality, usually prepared meals from the freezer. Since moving in with Sindy Messingorothy, Brooke has been eating much more volume and high sugar content foods.  __________________________________________________________  Problem List: Patient Active Problem List   Diagnosis Date Noted  . Diabetes (HCC) 09/25/2006    Priority: High  . Down's syndrome 09/25/2006    Priority: High  . Hyperlipidemia associated with type 2 diabetes mellitus (HCC) 04/20/2012    Priority: Medium  . Gout 08/06/2010    Priority: Medium  . Blindness 04/06/2016    Priority: Low  . Healthcare maintenance 08/06/2011    Priority: Low  .  Essential hypertension 09/25/2006    Priority: Low    Medications: Reconciled today in Epic __________________________________________________________  Physical Exam:  Vital Signs: Vitals:   09/28/16 1013  BP: 122/72  Pulse: (!) 103  Temp: 98.1 F (36.7 C)  TempSrc: Oral  SpO2: 97%  Weight: 154 lb 9.6 oz (70.1 kg)  Height: 4\' 8"  (1.422 m)    Gen: Well appearing, NAD, drinking water ENT: OP clear without erythema or exudate, dry MM, left eye enucleated and right eye with heavy cataract  CV: mildly tachycardic, no murmurs Pulm: Normal effort, CTA throughout, no wheezing Abd: Soft, NT, ND, normal BS.  Ext: Warm, no edema, normal joints Skin: No atypical appearing moles. No rashes

## 2016-09-30 ENCOUNTER — Telehealth: Payer: Self-pay | Admitting: *Deleted

## 2016-09-30 MED ORDER — INSULIN ASPART PROT & ASPART (70-30 MIX) 100 UNIT/ML PEN: 10.0000 [IU] | PEN_INJECTOR | Freq: Two times a day (BID) | SUBCUTANEOUS | 2 refills | Status: DC

## 2016-09-30 NOTE — Telephone Encounter (Signed)
Ok. I really want an intermediate insulin that comes in a pen. If they dont cover Humulin 70/30 pens then I will try Novolog 70/30 pens. Unfortunately Novolin does not come as a pen and the patient's sister is unable to see well enough to draw up a syringe.   I called Linda Lynn (sister) to see how Linda Lynn was doing and clarify this change, no answer so I left a VM. Hopefully Linda Lynn will come in later today for repeat labs.

## 2016-09-30 NOTE — Telephone Encounter (Signed)
Patient's current insurance will not cover Humulin 70/30 Kwikpen. Will cover Novolin 70/30.  Change needed in prescription for coverage.  Angelina OkGladys Thimothy Barretta, RN 09/30/2016 12:10 PM

## 2016-10-01 ENCOUNTER — Ambulatory Visit: Payer: Self-pay | Admitting: Internal Medicine

## 2016-10-02 ENCOUNTER — Other Ambulatory Visit: Payer: Self-pay

## 2016-10-05 ENCOUNTER — Other Ambulatory Visit (INDEPENDENT_AMBULATORY_CARE_PROVIDER_SITE_OTHER): Payer: MEDICARE

## 2016-10-05 DIAGNOSIS — E1165 Type 2 diabetes mellitus with hyperglycemia: Secondary | ICD-10-CM | POA: Diagnosis not present

## 2016-10-05 DIAGNOSIS — E119 Type 2 diabetes mellitus without complications: Secondary | ICD-10-CM

## 2016-10-05 LAB — GLUCOSE, CAPILLARY: GLUCOSE-CAPILLARY: 194 mg/dL — AB (ref 65–99)

## 2016-10-05 MED ORDER — GLUCOSE BLOOD VI STRP: ORAL_STRIP | 12 refills | Status: DC

## 2016-10-05 NOTE — Addendum Note (Signed)
Addended by: Maura CrandallGOLDSTON, Susan Bleich C on: 10/05/2016 11:01 AM   Modules accepted: Orders

## 2016-10-05 NOTE — Progress Notes (Signed)
Pt here for lab visit only- Bmet obtained and pt had a random glucose of 194 (was given 10 units of novolog 70/30 earlier this am).  Per pt's her caregiver-they have been unable to obtain test strips for the meter that was rx'd during last visit it was not covered by pt's insurance.  I contacted the pharmacy and they suggested that I resubmit rx with dx code attached and they may be able to run it under pt's Medicare B, plan.  Will resubmit rx with BID testing and send to pcp for cosign.Linda Lynn, Linda Cassady11/20/201710:59 AM

## 2016-10-06 ENCOUNTER — Telehealth: Payer: Self-pay | Admitting: Student in an Organized Health Care Education/Training Program

## 2016-10-06 DIAGNOSIS — E119 Type 2 diabetes mellitus without complications: Secondary | ICD-10-CM

## 2016-10-06 LAB — BMP8+ANION GAP
ANION GAP: 16 mmol/L (ref 10.0–18.0)
BUN / CREAT RATIO: 17 (ref 9–23)
BUN: 15 mg/dL (ref 6–24)
CHLORIDE: 97 mmol/L (ref 96–106)
CO2: 25 mmol/L (ref 18–29)
CREATININE: 0.86 mg/dL (ref 0.57–1.00)
Calcium: 9 mg/dL (ref 8.7–10.2)
GFR calc Af Amer: 88 mL/min/{1.73_m2} (ref >59)
GFR calc non Af Amer: 76 mL/min/{1.73_m2} (ref >59)
GLUCOSE: 192 mg/dL — AB (ref 65–99)
POTASSIUM: 4.2 mmol/L (ref 3.5–5.2)
SODIUM: 138 mmol/L (ref 134–144)

## 2016-10-06 MED ORDER — ACCU-CHEK SOFT TOUCH LANCETS MISC: 3 refills | Status: DC

## 2016-10-06 MED ORDER — ACCU-CHEK AVIVA DEVI: 0 refills | Status: DC

## 2016-10-06 MED ORDER — GLUCOSE BLOOD VI STRP: ORAL_STRIP | 3 refills | Status: DC

## 2016-10-06 NOTE — Telephone Encounter (Signed)
Linda Lynn is a type II diabetic, previously controlled without medications, in the office last week with wildly uncontrolled hyperglycemia and early signs of dehydration. We initiated intermediate acting insulin twice a day and pushed oral hydration at home for the last week. Follow-up labs drawn yesterday are much improved, AKI resolved, blood sugar is 190 down from over 600. I called her caretaker [name] who reports that Linda Lynn is doing much better, eating and drinking well. Doing well with the insulin dosing. I'm going to call in a new prescription for Accu-Chek test strips and monitor which will hopefully be better covered by Medicaid. She'll follow-up with me in February for repeat A1c and we can further titrate insulin as needed.

## 2016-10-16 ENCOUNTER — Encounter: Payer: Self-pay | Admitting: *Deleted

## 2016-10-23 ENCOUNTER — Other Ambulatory Visit: Payer: Self-pay | Admitting: Student in an Organized Health Care Education/Training Program

## 2016-10-23 MED ORDER — PEN NEEDLES 31G X 5 MM MISC: 1.0000 "pen " | Freq: Two times a day (BID) | 5 refills | Status: DC

## 2016-10-23 NOTE — Telephone Encounter (Signed)
Pt requesting refill insulin aspart protamine - aspart (NOVOLOG 70/30 MIX) (70-30) 100 UNIT/ML FlexPen Inject 0.1 mLs (10 Units total) into the skin 2 (two) times daily., Starting Wed 09/30/2016, Normal

## 2016-10-23 NOTE — Telephone Encounter (Signed)
Called pt's sister -States pt needs pen needles for flex pens. Has plenty of insulin. Thanks

## 2017-01-04 ENCOUNTER — Ambulatory Visit (INDEPENDENT_AMBULATORY_CARE_PROVIDER_SITE_OTHER): Payer: MEDICARE | Admitting: Student in an Organized Health Care Education/Training Program

## 2017-01-04 ENCOUNTER — Other Ambulatory Visit (HOSPITAL_COMMUNITY)
Admission: RE | Admit: 2017-01-04 | Discharge: 2017-01-04 | Disposition: A | Discharge status: Home / Self Care | Payer: MEDICARE | Source: Ambulatory Visit | Attending: Student in an Organized Health Care Education/Training Program | Admitting: Student in an Organized Health Care Education/Training Program

## 2017-01-04 ENCOUNTER — Encounter (INDEPENDENT_AMBULATORY_CARE_PROVIDER_SITE_OTHER): Payer: Self-pay

## 2017-01-04 VITALS — BP 120/80 | HR 54 | Temp 97.8°F | Ht <= 58 in | Wt 158.3 lb

## 2017-01-04 DIAGNOSIS — E119 Type 2 diabetes mellitus without complications: Secondary | ICD-10-CM

## 2017-01-04 DIAGNOSIS — E785 Hyperlipidemia, unspecified: Principal | ICD-10-CM

## 2017-01-04 DIAGNOSIS — I10 Essential (primary) hypertension: Secondary | ICD-10-CM

## 2017-01-04 DIAGNOSIS — Z833 Family history of diabetes mellitus: Secondary | ICD-10-CM | POA: Diagnosis not present

## 2017-01-04 DIAGNOSIS — E1169 Type 2 diabetes mellitus with other specified complication: Secondary | ICD-10-CM

## 2017-01-04 DIAGNOSIS — Q909 Down syndrome, unspecified: Secondary | ICD-10-CM

## 2017-01-04 DIAGNOSIS — Z7984 Long term (current) use of oral hypoglycemic drugs: Secondary | ICD-10-CM | POA: Diagnosis not present

## 2017-01-04 DIAGNOSIS — Z1151 Encounter for screening for human papillomavirus (HPV): Secondary | ICD-10-CM | POA: Insufficient documentation

## 2017-01-04 DIAGNOSIS — Z0000 Encounter for general adult medical examination without abnormal findings: Secondary | ICD-10-CM | POA: Insufficient documentation

## 2017-01-04 LAB — POCT GLYCOSYLATED HEMOGLOBIN (HGB A1C): Hemoglobin A1C: 6.7

## 2017-01-04 LAB — GLUCOSE, CAPILLARY: Glucose-Capillary: 77 mg/dL (ref 65–99)

## 2017-01-04 MED ORDER — METFORMIN HCL 1000 MG PO TABS: 1000.0000 mg | ORAL_TABLET | Freq: Two times a day (BID) | ORAL | 3 refills | Status: DC

## 2017-01-04 NOTE — Patient Instructions (Addendum)
1. We tried a pap smear today, but it was a difficult exam. If the sample we collected is not good enough for the lab, I will refer Linda Lynn to gynecology who has more tools to be able to better see her cervix.   2. Please increase metformin to 1000mg  twice a day.   3. Stop insulin for now. Continue to check blood sugar 1-2 times daily. If the blood sugar is over 300, restart the insulin and call me please.

## 2017-01-04 NOTE — Assessment & Plan Note (Signed)
Historically this was type 2 diabetes that was well-controlled with careful diet. Patient had a change in her living situation 3 months ago and went to live with her other sister. This led to significant indiscretion with her carbohydrate intake. At her last visit she had severely uncontrolled hyperglycemia and early signs of dehydration without ketoacidosis. We managed her as an outpatient by initiating intermediate acting insulin 10 units twice a day along with low-dose metformin. She has tolerated these interventions well. I reviewed her home glucose log which shows blood sugars ranging from 65-171 with an average of 118. A1c today is 6.8% switches at goal. I think at this point we can transition back to an oral regimen only. Plan to discontinue insulin. Increase metformin to 1000 mg twice a day. Caretaker will still check a few blood sugars at home and let me know if there are any readings over 300. Continue low carbohydrate nutrition. Follow up with me in 3 months.

## 2017-01-04 NOTE — Assessment & Plan Note (Signed)
Partially doing well in her current living situation. Has help throughout the day. No benefit to medicines like Namenda or Aricept at this time probably. Plan to continue to watch her clinical status and follow-up with her caretakers.

## 2017-01-04 NOTE — Progress Notes (Signed)
Assessment and Plan:  See Encounters tab for problem-based medical decision making.   __________________________________________________________  HPI:  56 year old woman with Down syndrome comes in today for follow-up of diabetes. When I last saw her in the office 3 months ago she had wildly uncontrolled hyperglycemia with signs of dehydration. She did not have ketoacidosis so we were able to manage her as an outpatient. At that time we initiated metformin and insulin. She is completely today by her family who says that she is doing well at home. She tolerates the insulin injections well. Also tolerating the metformin without any signs of GI upset. They continue to give her a healthy diet consistent with other family members who have diabetes. They check her blood sugar every day and denied having any signs of hypoglycemia. No recent illnesses, no fevers or chills. Overall functionally stable at home.  __________________________________________________________  Problem List: Patient Active Problem List   Diagnosis Date Noted  . Diabetes (HCC) 09/25/2006    Priority: High  . Down's syndrome 09/25/2006    Priority: High  . Hyperlipidemia associated with type 2 diabetes mellitus (HCC) 04/20/2012    Priority: Medium  . Gout 08/06/2010    Priority: Medium  . Blindness 04/06/2016    Priority: Low  . Healthcare maintenance 08/06/2011    Priority: Low  . Essential hypertension 09/25/2006    Priority: Low    Medications: Reconciled today in Epic __________________________________________________________  Physical Exam:  Vital Signs: Vitals:   01/04/17 1010  BP: 120/80  Pulse: (!) 54  Temp: 97.8 F (36.6 C)  TempSrc: Oral  Weight: 158 lb 4.8 oz (71.8 kg)  Height: 4\' 8"  (1.422 m)    Gen: Well appearing, NAD CV: RRR, no murmurs Pulm: Normal effort, CTA throughout, no wheezing Abd: Soft, NT, ND, normal BS.  Gen: Normal external genitalia, mostly intact hymen, cervix appeared  normal but I had only a partial view because we used the small speculum for comfort. Ext: Warm, no edema, normal joints Skin: No atypical appearing moles. No rashes

## 2017-01-04 NOTE — Assessment & Plan Note (Signed)
Unclear if the patient has ever had cervical cancer screening. I talked about the risks and the benefits with her caretaker. Linda Lynn didn't have any objections and the caretaker agreed that we should try to collect a Pap smear. It was a difficult exam, we did collect a sample to send for Pap and HPV. If the sample was inadequate I will refer her to gynecology for repeat exam.

## 2017-01-04 NOTE — Assessment & Plan Note (Signed)
Blood pressure continues to be well controlled without the need for medication. We talked about continuing diets interventions and will follow up her blood pressures in 3 months.

## 2017-01-06 LAB — CYTOLOGY - PAP
DIAGNOSIS: NEGATIVE
HPV (WINDOPATH): NOT DETECTED

## 2017-01-07 ENCOUNTER — Telehealth: Payer: Self-pay | Admitting: Student in an Organized Health Care Education/Training Program

## 2017-01-07 DIAGNOSIS — Z0000 Encounter for general adult medical examination without abnormal findings: Secondary | ICD-10-CM

## 2017-01-07 NOTE — Telephone Encounter (Signed)
I called and spoke with Linda Lynn. Pap was normal with negative HPV. She is low risk and we will do another pap in 5 years. Insulin change is also going well at home.

## 2017-02-25 ENCOUNTER — Other Ambulatory Visit: Payer: Self-pay | Admitting: Student in an Organized Health Care Education/Training Program

## 2017-06-09 ENCOUNTER — Ambulatory Visit (INDEPENDENT_AMBULATORY_CARE_PROVIDER_SITE_OTHER): Payer: MEDICARE | Admitting: Podiatry

## 2017-06-09 ENCOUNTER — Encounter: Payer: Self-pay | Admitting: Podiatry

## 2017-06-09 DIAGNOSIS — M79676 Pain in unspecified toe(s): Secondary | ICD-10-CM

## 2017-06-09 DIAGNOSIS — E1149 Type 2 diabetes mellitus with other diabetic neurological complication: Secondary | ICD-10-CM

## 2017-06-09 DIAGNOSIS — B351 Tinea unguium: Secondary | ICD-10-CM | POA: Diagnosis not present

## 2017-06-09 DIAGNOSIS — M2012 Hallux valgus (acquired), left foot: Secondary | ICD-10-CM

## 2017-06-09 DIAGNOSIS — L84 Corns and callosities: Secondary | ICD-10-CM

## 2017-06-09 DIAGNOSIS — M2011 Hallux valgus (acquired), right foot: Secondary | ICD-10-CM

## 2017-06-09 NOTE — Progress Notes (Signed)
Patient ID: Linda Lynn, female   DOB: [birth year][name], 56 y.o.   MRN: [id number] Complaint:  Visit Type: Patient returns to my office for continued preventative foot care services. Complaint: Patient states" my nails have grown long and thick and become painful to walk and wear shoes" Patient has been diagnosed with DM with no foot neuropathy. The patient presents for preventative foot care services. No changes to ROS.  Patient family requests diabetic shoes.  The granddaughter brings [name] in for treatment today  Podiatric Exam: Vascular: dorsalis pedis and posterior tibial pulses are not  palpable bilateral. Capillary return is immediate.  Cold feet noted.  Sensorium: Diminished  Semmes Weinstein monofilament test. Normal tactile sensation bilaterally. Nail Exam: Pt has thick disfigured discolored nails with subungual debris noted bilateral entire nail hallux  Ulcer Exam: Preulcerous heel callus  B/L Orthopedic Exam: Muscle tone and strength are WNL. No limitations in general ROM. No crepitus or effusions noted.  HAV  B/L Skin: No Porokeratosis. No infection or ulcers. Asymptomatic callus heels B/L  Diagnosis:  Onychomycosis, , Pain in right toe, pain in left toes  Treatment & Plan Procedures and Treatment: Consent by patient was obtained for treatment procedures. The patient understood the discussion of treatment and procedures well. All questions were answered thoroughly reviewed. Debridement of mycotic and hypertrophic toenails, 1 through 5 bilateral and clearing of subungual debris. No ulceration, no infection noted. Initiate diabetic shoe paperwork for HAV deformities B/L and preulcerous callus heels B/L. Return Visit-Office Procedure: Patient instructed to return to the office for a follow up visit 6 months for continued evaluation and treatment.      Helane GuntherGregory Amariyon Maynes DPM

## 2017-07-01 ENCOUNTER — Ambulatory Visit (INDEPENDENT_AMBULATORY_CARE_PROVIDER_SITE_OTHER): Payer: MEDICARE | Admitting: Orthotics

## 2017-07-01 DIAGNOSIS — E1149 Type 2 diabetes mellitus with other diabetic neurological complication: Secondary | ICD-10-CM | POA: Diagnosis not present

## 2017-07-01 DIAGNOSIS — M2012 Hallux valgus (acquired), left foot: Secondary | ICD-10-CM | POA: Diagnosis not present

## 2017-07-01 DIAGNOSIS — M2011 Hallux valgus (acquired), right foot: Secondary | ICD-10-CM

## 2017-07-01 DIAGNOSIS — M2010 Hallux valgus (acquired), unspecified foot: Secondary | ICD-10-CM

## 2017-07-01 DIAGNOSIS — L84 Corns and callosities: Secondary | ICD-10-CM | POA: Diagnosis not present

## 2017-07-01 NOTE — Progress Notes (Signed)

## 2017-10-25 ENCOUNTER — Ambulatory Visit: Payer: Self-pay | Admitting: Student in an Organized Health Care Education/Training Program

## 2017-12-09 ENCOUNTER — Ambulatory Visit: Payer: MEDICARE | Admitting: Podiatry

## 2018-01-10 ENCOUNTER — Ambulatory Visit (HOSPITAL_COMMUNITY)
Admission: RE | Admit: 2018-01-10 | Discharge: 2018-01-10 | Disposition: A | Discharge status: Home / Self Care | Payer: MEDICARE | Source: Ambulatory Visit | Attending: Student in an Organized Health Care Education/Training Program | Admitting: Student in an Organized Health Care Education/Training Program

## 2018-01-10 ENCOUNTER — Ambulatory Visit (INDEPENDENT_AMBULATORY_CARE_PROVIDER_SITE_OTHER): Payer: MEDICARE | Admitting: Student in an Organized Health Care Education/Training Program

## 2018-01-10 ENCOUNTER — Encounter: Payer: Self-pay | Admitting: Student in an Organized Health Care Education/Training Program

## 2018-01-10 VITALS — BP 135/71 | HR 50 | Temp 97.8°F | Wt 147.6 lb

## 2018-01-10 DIAGNOSIS — Z9001 Acquired absence of eye: Secondary | ICD-10-CM | POA: Diagnosis not present

## 2018-01-10 DIAGNOSIS — Z0000 Encounter for general adult medical examination without abnormal findings: Secondary | ICD-10-CM

## 2018-01-10 DIAGNOSIS — E1136 Type 2 diabetes mellitus with diabetic cataract: Secondary | ICD-10-CM | POA: Diagnosis not present

## 2018-01-10 DIAGNOSIS — Z7984 Long term (current) use of oral hypoglycemic drugs: Secondary | ICD-10-CM | POA: Diagnosis not present

## 2018-01-10 DIAGNOSIS — H543 Unqualified visual loss, both eyes: Secondary | ICD-10-CM | POA: Diagnosis not present

## 2018-01-10 DIAGNOSIS — Z23 Encounter for immunization: Secondary | ICD-10-CM | POA: Diagnosis not present

## 2018-01-10 DIAGNOSIS — K0889 Other specified disorders of teeth and supporting structures: Secondary | ICD-10-CM

## 2018-01-10 DIAGNOSIS — R011 Cardiac murmur, unspecified: Secondary | ICD-10-CM

## 2018-01-10 DIAGNOSIS — R001 Bradycardia, unspecified: Secondary | ICD-10-CM | POA: Insufficient documentation

## 2018-01-10 DIAGNOSIS — Q909 Down syndrome, unspecified: Secondary | ICD-10-CM

## 2018-01-10 DIAGNOSIS — E119 Type 2 diabetes mellitus without complications: Secondary | ICD-10-CM

## 2018-01-10 DIAGNOSIS — I10 Essential (primary) hypertension: Secondary | ICD-10-CM | POA: Diagnosis not present

## 2018-01-10 LAB — POCT GLYCOSYLATED HEMOGLOBIN (HGB A1C): Hemoglobin A1C: 6

## 2018-01-10 LAB — GLUCOSE, CAPILLARY: GLUCOSE-CAPILLARY: 138 mg/dL — AB (ref 65–99)

## 2018-01-10 MED ORDER — METFORMIN HCL 1000 MG PO TABS: 1000.0000 mg | ORAL_TABLET | Freq: Two times a day (BID) | ORAL | 3 refills | Status: DC

## 2018-01-10 NOTE — Progress Notes (Signed)
   Assessment and Plan:  See Encounters tab for problem-based medical decision making.   __________________________________________________________  HPI:   57 year old woman here for follow-up of diabetes.  It has been a year since I have seen her last.  Last year she went to live with a different sister and had dietary indiscretion causing her blood sugars to become mildly uncontrolled.  We had to add short acting insulin for a period of time which she tolerated well.  She is cared for by her sister Linda Lynn, she also has a grand niece who helps her throughout the day.  She is independent in many of her activities of daily living including dressing, eating, and toileting.  Linda Lynn tells me that her functional status has been stable at home.  Memory is stable as well.  No behavioral outbursts.  No recent illnesses.  Linda Lynn does not complain of chest pain or pressure.  She had a swollen right knee a couple weeks ago which resolved with supportive care and just 1-2 days.  She has had no hospitalizations, emergency department visits, or urgent care visits in the last 1 year.  Reports that overall things are going well at home.  She is compliant with her one medication.  She follows with an eye doctor for her blindness unfortunately the eye doctor says that there is nothing further to be done for her vision.  __________________________________________________________  Problem List: Patient Active Problem List   Diagnosis Date Noted  . Diabetes (HCC) 09/25/2006    Priority: High  . Down's syndrome 09/25/2006    Priority: High  . Hyperlipidemia associated with type 2 diabetes mellitus (HCC) 04/20/2012    Priority: Medium  . Gout 08/06/2010    Priority: Medium  . Blindness 04/06/2016    Priority: Low  . Healthcare maintenance 08/06/2011    Priority: Low  . Essential hypertension 09/25/2006    Priority: Low  . Bradycardia 01/10/2018    Medications: Reconciled today in  Epic __________________________________________________________  Physical Exam:  Vital Signs: Vitals:   01/10/18 0943  BP: 135/71  Pulse: (!) 50  Temp: 97.8 F (36.6 C)  TempSrc: Oral  SpO2: 97%  Weight: 147 lb 9.6 oz (67 kg)    Gen: Well appearing, NAD ENT: OP clear without erythema or exudate.  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  Very poor dentition with multiple missing teeth. Neck: No cervical LAD, No thyromegaly or nodules, No JVD. CV: Irregular rhythm, distant heart sounds, soft early systolic murmur at the right upper sternal border Pulm: Normal effort, CTA throughout, no wheezing Ext: Warm, no edema, normal joints Skin: No atypical appearing moles. No rashes.  Dry skin

## 2018-01-10 NOTE — Assessment & Plan Note (Addendum)
Hemoglobin A1c 6.0% which is below goal.  Plan to continue metformin 1000 mg twice daily she is having no side effects.  She is having some good weight loss, 11 pounds over the last year, as a result of improved nutrition.  Foot exam today was normal.  No need for eye exam due to blindness, there is nothing further the ophthalmologist has to offer.  Given her Down syndrome I doubt she would benefit from aspirin or statin for primary prevention of ischemic disease, and we want to reduce pill burden to simplify her medication regimen when able.

## 2018-01-10 NOTE — Assessment & Plan Note (Signed)
Blood pressure is largely well controlled, borderline today.  May continue to monitor it, if her blood pressure gets above 140 consistently in the future would favor adding an ACE inhibitor.

## 2018-01-10 NOTE — Assessment & Plan Note (Signed)
Stable functional status.  Great support from her sister and grand-niece.  They deny any signs of dementia right now and she is independent in her activities of daily living.  No behavioral concerns either.  Plan to continue with monitoring.

## 2018-01-10 NOTE — Assessment & Plan Note (Signed)
Bradycardic on initial vitals to 50.  ECG now shows sinus tachycardia with a rate of 104 beats per minutes, normal access, normal intervals, no Q waves or T wave inversions.  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  We will continue to monitor.

## 2018-01-10 NOTE — Assessment & Plan Note (Signed)
We talked about risks and benefits of mammography for screening of breast cancer.  Patient tolerates testing very well, she let us draw blood work, she is let us do a Pap smear and colonoscopy in the past as well, so I think she would tolerate any testing that abnormal mammogram would yield.  We decided to go ahead and start breast cancer screening at this time, this was a shared decision with the patient's sister.

## 2018-01-10 NOTE — Patient Instructions (Signed)
1. I want to see Linda Lynn back at least every 4 months to check on her diabetes.   2. I have ordered a mammogram to screen for breast cancer.   3. We checked blood work today and I will send you a letter with the results.

## 2018-01-11 ENCOUNTER — Encounter: Payer: Self-pay | Admitting: Student in an Organized Health Care Education/Training Program

## 2018-01-11 LAB — BMP8+ANION GAP
Anion Gap: 16 mmol/L (ref 10.0–18.0)
BUN / CREAT RATIO: 19 (ref 9–23)
BUN: 22 mg/dL (ref 6–24)
CO2: 23 mmol/L (ref 20–29)
Calcium: 9.3 mg/dL (ref 8.7–10.2)
Chloride: 105 mmol/L (ref 96–106)
Creatinine, Ser: 1.15 mg/dL — ABNORMAL HIGH (ref 0.57–1.00)
GFR, EST AFRICAN AMERICAN: 61 mL/min/{1.73_m2} (ref >59)
GFR, EST NON AFRICAN AMERICAN: 53 mL/min/{1.73_m2} — AB (ref >59)
Glucose: 152 mg/dL — ABNORMAL HIGH (ref 65–99)
Potassium: 3.9 mmol/L (ref 3.5–5.2)
SODIUM: 144 mmol/L (ref 134–144)

## 2018-01-11 LAB — HIV ANTIBODY (ROUTINE TESTING W REFLEX): HIV Screen 4th Generation wRfx: NONREACTIVE

## 2018-01-11 LAB — HEPATITIS C ANTIBODY: Hep C Virus Ab: <0.1 s/co ratio (ref 0.0–0.9)

## 2018-03-11 ENCOUNTER — Other Ambulatory Visit: Payer: Self-pay | Admitting: Student in an Organized Health Care Education/Training Program

## 2018-03-11 DIAGNOSIS — Z1231 Encounter for screening mammogram for malignant neoplasm of breast: Secondary | ICD-10-CM

## 2018-03-24 ENCOUNTER — Other Ambulatory Visit: Payer: Self-pay | Admitting: Student in an Organized Health Care Education/Training Program

## 2018-03-24 DIAGNOSIS — E119 Type 2 diabetes mellitus without complications: Secondary | ICD-10-CM

## 2018-03-29 ENCOUNTER — Encounter: Payer: Self-pay | Admitting: Student in an Organized Health Care Education/Training Program

## 2018-04-12 ENCOUNTER — Ambulatory Visit
Admission: RE | Admit: 2018-04-12 | Discharge: 2018-04-12 | Disposition: A | Discharge status: Home / Self Care | Payer: MEDICARE | Source: Ambulatory Visit | Attending: Student in an Organized Health Care Education/Training Program | Admitting: Student in an Organized Health Care Education/Training Program

## 2018-04-12 DIAGNOSIS — Z1231 Encounter for screening mammogram for malignant neoplasm of breast: Secondary | ICD-10-CM | POA: Diagnosis not present

## 2018-04-18 ENCOUNTER — Other Ambulatory Visit: Payer: Self-pay

## 2018-04-18 ENCOUNTER — Ambulatory Visit (INDEPENDENT_AMBULATORY_CARE_PROVIDER_SITE_OTHER): Payer: MEDICARE | Admitting: Student in an Organized Health Care Education/Training Program

## 2018-04-18 ENCOUNTER — Encounter: Payer: Self-pay | Admitting: Student in an Organized Health Care Education/Training Program

## 2018-04-18 VITALS — BP 144/82 | HR 57 | Temp 98.2°F | Wt 145.2 lb

## 2018-04-18 DIAGNOSIS — Z23 Encounter for immunization: Secondary | ICD-10-CM | POA: Diagnosis not present

## 2018-04-18 DIAGNOSIS — E1169 Type 2 diabetes mellitus with other specified complication: Secondary | ICD-10-CM

## 2018-04-18 DIAGNOSIS — Q909 Down syndrome, unspecified: Secondary | ICD-10-CM

## 2018-04-18 DIAGNOSIS — I10 Essential (primary) hypertension: Secondary | ICD-10-CM

## 2018-04-18 LAB — POCT GLYCOSYLATED HEMOGLOBIN (HGB A1C): HEMOGLOBIN A1C: 6.4 % — AB (ref 4.0–5.6)

## 2018-04-18 LAB — GLUCOSE, CAPILLARY: Glucose-Capillary: 131 mg/dL — ABNORMAL HIGH (ref 65–99)

## 2018-04-18 MED ORDER — LISINOPRIL 10 MG PO TABS: 10.0000 mg | ORAL_TABLET | Freq: Every day | ORAL | 3 refills | Status: DC

## 2018-04-18 NOTE — Patient Instructions (Signed)
Annual his blood pressure is a little bit high, this is common for folks with diabetes and who are overweight.  We are going to start a low-dose of lisinopril to help with that high blood pressure.  I want to see her back in 3 months and we will check her blood pressure again and her blood work at that time.  Also today we gave her a vaccine against a common cause for pneumonia.  Were also going to test her urine for signs of protein which is a possible complication of diabetes.

## 2018-04-18 NOTE — Assessment & Plan Note (Signed)
Blood pressure is elevated today at 144/82.  At her last visit it was slightly elevated at 135/71.  Weight is stable at 145 pounds.  At this point given her comorbid diabetes I recommended treating with a low-dose ACE inhibitor.  Family is agreeable.  Plan to start lisinopril 10 mg once daily.  Follow-up in 3 months for blood pressure recheck and BMP.

## 2018-04-18 NOTE — Assessment & Plan Note (Signed)
Diabetes has been well controlled this year on metformin 1000 mg daily.  Tolerating it well with no side effects.  Plan to check an A1c today.  Otherwise continue with  metformin as already prescribed.  Follow-up in 3 months.  Will check a urine microalbumin today to look for proteinuria.

## 2018-04-18 NOTE — Assessment & Plan Note (Signed)
Stable functional status.  She lives with her sister full-time.  She has an aide for 8 hours a day covered by Medicaid so that her sister can go to work. Also working with CAP team to arrange for more activities out of the house with other neuro-atypical people.  We talked about nutrition today, avoiding overeating. Also about exercise.

## 2018-04-18 NOTE — Progress Notes (Signed)
   Assessment and Plan:  See Encounters tab for problem-based medical decision making.   __________________________________________________________  HPI:   57 year old woman with Down syndrome here for follow-up of her diabetes.  She is accompanied by her sister today.  Patient is unable to give any history because of Down syndrome and associated dementia.  Patient has no other complaints acutely.  Sister says things are going well at home.  She is compliant with her medications without complaints.  She is been eating and drinking well, they are watching her sugar and salt intake.  [name] spends her days at home, she requires 24-hour assistance.  No safety concerns right now.  No recent falls.  No visits to any emergency departments, hospitals, and does not see any other physicians.  Recently completed mammogram which was normal.  __________________________________________________________  Problem List: Patient Active Problem List   Diagnosis Date Noted  . Type 2 diabetes mellitus with other specified complication (HCC) 09/25/2006    Priority: High  . Down's syndrome 09/25/2006    Priority: High  . Hyperlipidemia associated with type 2 diabetes mellitus (HCC) 04/20/2012    Priority: Medium  . Gout 08/06/2010    Priority: Medium  . Bradycardia 01/10/2018    Priority: Low  . Blindness 04/06/2016    Priority: Low  . Healthcare maintenance 08/06/2011    Priority: Low  . Essential hypertension 09/25/2006    Priority: Low    Medications: Reconciled today in Epic __________________________________________________________  Physical Exam:  Vital Signs: Vitals:   04/18/18 0909  BP: (!) 144/82  Pulse: (!) 57  Temp: 98.2 F (36.8 C)  TempSrc: Oral  SpO2: 100%  Weight: 145 lb 3.2 oz (65.9 kg)    Gen: Well appearing, NAD ENT: OP clear without erythema or exudate.  Neck: No cervical LAD, No thyromegaly or nodules, No JVD. CV: RRR, no murmurs Pulm: Normal effort, CTA  throughout, no wheezing Abd: Soft, NT, ND, normal BS.  Ext: Warm, no edema, normal joints Skin: No atypical appearing moles. No rashes

## 2018-04-19 LAB — MICROALBUMIN / CREATININE URINE RATIO
Creatinine, Urine: 106.3 mg/dL
Microalb/Creat Ratio: 20 mg/g creat (ref 0.0–30.0)
Microalbumin, Urine: 21.3 ug/mL

## 2018-10-03 ENCOUNTER — Ambulatory Visit (INDEPENDENT_AMBULATORY_CARE_PROVIDER_SITE_OTHER): Payer: MEDICARE | Admitting: Student in an Organized Health Care Education/Training Program

## 2018-10-03 ENCOUNTER — Encounter: Payer: Self-pay | Admitting: Student in an Organized Health Care Education/Training Program

## 2018-10-03 ENCOUNTER — Other Ambulatory Visit: Payer: Self-pay

## 2018-10-03 VITALS — BP 110/68 | HR 67 | Temp 97.6°F | Wt 135.9 lb

## 2018-10-03 DIAGNOSIS — Z7984 Long term (current) use of oral hypoglycemic drugs: Secondary | ICD-10-CM

## 2018-10-03 DIAGNOSIS — H5461 Unqualified visual loss, right eye, normal vision left eye: Secondary | ICD-10-CM | POA: Diagnosis not present

## 2018-10-03 DIAGNOSIS — E785 Hyperlipidemia, unspecified: Secondary | ICD-10-CM | POA: Diagnosis not present

## 2018-10-03 DIAGNOSIS — R001 Bradycardia, unspecified: Secondary | ICD-10-CM

## 2018-10-03 DIAGNOSIS — Q909 Down syndrome, unspecified: Secondary | ICD-10-CM

## 2018-10-03 DIAGNOSIS — Z23 Encounter for immunization: Secondary | ICD-10-CM

## 2018-10-03 DIAGNOSIS — E669 Obesity, unspecified: Secondary | ICD-10-CM | POA: Diagnosis not present

## 2018-10-03 DIAGNOSIS — I10 Essential (primary) hypertension: Secondary | ICD-10-CM | POA: Diagnosis not present

## 2018-10-03 DIAGNOSIS — Z79899 Other long term (current) drug therapy: Secondary | ICD-10-CM

## 2018-10-03 DIAGNOSIS — E119 Type 2 diabetes mellitus without complications: Secondary | ICD-10-CM | POA: Diagnosis not present

## 2018-10-03 DIAGNOSIS — E8881 Metabolic syndrome: Secondary | ICD-10-CM | POA: Diagnosis not present

## 2018-10-03 DIAGNOSIS — E1169 Type 2 diabetes mellitus with other specified complication: Secondary | ICD-10-CM | POA: Diagnosis not present

## 2018-10-03 DIAGNOSIS — Z6830 Body mass index (BMI) 30.0-30.9, adult: Secondary | ICD-10-CM | POA: Diagnosis not present

## 2018-10-03 LAB — POCT GLYCOSYLATED HEMOGLOBIN (HGB A1C): Hemoglobin A1C: 6.3 % — AB (ref 4.0–5.6)

## 2018-10-03 LAB — GLUCOSE, CAPILLARY: GLUCOSE-CAPILLARY: 107 mg/dL — AB (ref 70–99)

## 2018-10-03 MED ORDER — METFORMIN HCL 500 MG PO TABS: 500.0000 mg | ORAL_TABLET | Freq: Two times a day (BID) | ORAL | 3 refills | Status: DC

## 2018-10-03 NOTE — Assessment & Plan Note (Signed)
Goal blood pressures less than 130/80.  Little elevated when she first came into the office.  Caretaker tells me her blood pressures at home are more appropriate, vast majority systolic pressures are less than 130.  Last visit we started lisinopril 10 mg daily, no side effects, will continue with this dose for now.  Plan to recheck BMP today.

## 2018-10-03 NOTE — Assessment & Plan Note (Addendum)
Diabetes is stable, complicated by hypertension, obesity, hyperlipidemia, metabolic syndrome.  No signs of nephropathy, or other small vessel disease.  Hemoglobin A1c has been historically well controlled, under 7%.  Having some GI dyspepsia likely due to high-dose metformin.  I think we have little room in her glucose control.  Plan is to decrease metformin to 500 mg twice daily.  Recheck A1c today.  She also has a history of hyperlipidemia, I am going to recheck lipids today.  If her ASCVD risk is greater than 10% I will recommend pravastatin for primary prevention of ischemic heart disease.

## 2018-10-03 NOTE — Assessment & Plan Note (Signed)
Lipids last checked in 2017, her ten-year risk was borderline at 7.7%.  Going to recheck lipids today.  She has had a good weight loss and good dietary modifications, however had to start hypertension medication.  If her risk is greater than 10% I will recommend starting pravastatin.  Her sister understands.

## 2018-10-03 NOTE — Patient Instructions (Signed)
Linda Lynn is doing very well, you are taking good care of her.  We can decrease the metformin to 500 mg twice a day.  You may cut your current tablets in half.  [facility], which are just 500 mg/tab.  Hopefully this will help with some of her abdominal bloating.  We are going to check labs today.  I will call you with the results.  If her cholesterol is still elevated we will talk about starting a cholesterol medication.  The goal here is to prevent strokes and heart attacks.  Keep checking her blood pressure about 2 times per week.  It would be helpful if you could write these down and bring her blood pressure cuff into her next visit so we can check its accuracy.

## 2018-10-03 NOTE — Assessment & Plan Note (Signed)
Stable, asymptomatic.  Heart rate 54 bpm today.  No intervention needed right now.

## 2018-10-03 NOTE — Progress Notes (Signed)
   Assessment and Plan:  See Encounters tab for problem-based medical decision making.   __________________________________________________________  HPI:   57 year old woman living with Down syndrome here for follow-up of diabetes and hypertension.  Accompanied by her sister and her caretaker today.  She has 24-hour care living with her family.  They report that she is doing well.  Only medication side effect is she complains consistently of upper abdominal bloating, gas, large stools.  They wonder if it is a side effect of metformin.  They report good compliance with her medications.  She is coming down with a little upper respiratory infection, there was a grandchild that was similarly ill.  No productive cough.  No fevers.  Doing well.  She is at home almost all the day, gets little exercise, does some walking on occasion.  Denies any chest pain or shortness of breath.  Sleeping well.  No other behavior problems.  __________________________________________________________  Problem List: Patient Active Problem List   Diagnosis Date Noted  . Type 2 diabetes mellitus with other specified complication (HCC) 09/25/2006    Priority: High  . Down's syndrome 09/25/2006    Priority: High  . Hyperlipidemia associated with type 2 diabetes mellitus (HCC) 04/20/2012    Priority: Medium  . Gout 08/06/2010    Priority: Medium  . Bradycardia 01/10/2018    Priority: Low  . Blindness 04/06/2016    Priority: Low  . Healthcare maintenance 08/06/2011    Priority: Low  . Essential hypertension 09/25/2006    Priority: Low    Medications: Reconciled today in Epic __________________________________________________________  Physical Exam:  Vital Signs: Vitals:   10/03/18 0902  BP: 139/76  Pulse: (!) 54  Temp: 97.6 F (36.4 C)  TempSrc: Oral  SpO2: 100%  Weight: 135 lb 14.4 oz (61.6 kg)    Gen: Well appearing, NAD ENT: Enucleated left eye, right eye is cloudy and blind Neck: No cervical  LAD, No thyromegaly or nodules CV: RRR, no murmurs Pulm: Normal effort, CTA throughout, no wheezing Abd: Soft, NT, ND Ext: Warm, no edema, normal joints

## 2018-10-04 LAB — BMP8+ANION GAP
Anion Gap: 17 mmol/L (ref 10.0–18.0)
BUN/Creatinine Ratio: 21 (ref 9–23)
BUN: 32 mg/dL — AB (ref 6–24)
CALCIUM: 9.6 mg/dL (ref 8.7–10.2)
CHLORIDE: 100 mmol/L (ref 96–106)
CO2: 23 mmol/L (ref 20–29)
Creatinine, Ser: 1.52 mg/dL — ABNORMAL HIGH (ref 0.57–1.00)
GFR calc Af Amer: 44 mL/min/{1.73_m2} — ABNORMAL LOW (ref >59)
GFR calc non Af Amer: 38 mL/min/{1.73_m2} — ABNORMAL LOW (ref >59)
GLUCOSE: 110 mg/dL — AB (ref 65–99)
Potassium: 4.3 mmol/L (ref 3.5–5.2)
Sodium: 140 mmol/L (ref 134–144)

## 2018-10-04 LAB — LIPID PANEL
Chol/HDL Ratio: 3 ratio (ref 0.0–4.4)
Cholesterol, Total: 208 mg/dL — ABNORMAL HIGH (ref 100–199)
HDL: 69 mg/dL (ref >39)
LDL Calculated: 124 mg/dL — ABNORMAL HIGH (ref 0–99)
TRIGLYCERIDES: 74 mg/dL (ref 0–149)
VLDL CHOLESTEROL CAL: 15 mg/dL (ref 5–40)

## 2018-10-11 ENCOUNTER — Encounter: Payer: Self-pay | Admitting: Student in an Organized Health Care Education/Training Program

## 2018-10-11 MED ORDER — PRAVASTATIN SODIUM 20 MG PO TABS: 20.0000 mg | ORAL_TABLET | Freq: Every day | ORAL | 3 refills | Status: DC

## 2018-10-11 NOTE — Addendum Note (Signed)
Addended by: Erlinda HongVINCENT, Shanica Castellanos T on: 10/11/2018 08:25 AM   Modules accepted: Orders

## 2018-10-17 ENCOUNTER — Telehealth: Payer: Self-pay

## 2018-10-17 NOTE — Telephone Encounter (Signed)
Requesting lab results. Please call back.  

## 2018-10-19 NOTE — Telephone Encounter (Signed)
Perfect. Thank you!

## 2018-10-19 NOTE — Telephone Encounter (Signed)
Call placed to Shayne Alkenorothy Young. She received letter from PCP with lab results. Has picked up pravastatin and is giving to patient. [name] wanted to know if there were any diet changes she can make. Discussed avoiding fried, fatty foods and increasing fiber. States she will. Kinnie FeilL. Stpehanie Montroy, RN, BSN

## 2018-11-14 ENCOUNTER — Other Ambulatory Visit: Payer: Self-pay

## 2018-11-14 NOTE — Telephone Encounter (Signed)
Needs to speak with a nurse about getting gout med to [facility] filled [facility] [address].

## 2018-11-17 NOTE — Telephone Encounter (Signed)
Spoke w/ a younger relative, grmother not available, will rtc in am

## 2018-11-27 ENCOUNTER — Other Ambulatory Visit: Payer: Self-pay | Admitting: Oncology

## 2018-11-27 DIAGNOSIS — E119 Type 2 diabetes mellitus without complications: Secondary | ICD-10-CM

## 2018-12-08 ENCOUNTER — Encounter: Payer: Self-pay | Admitting: *Deleted

## 2019-01-30 ENCOUNTER — Ambulatory Visit: Payer: Self-pay | Admitting: Student in an Organized Health Care Education/Training Program

## 2019-02-02 ENCOUNTER — Telehealth: Payer: Self-pay | Admitting: Student in an Organized Health Care Education/Training Program

## 2019-02-02 NOTE — Telephone Encounter (Signed)
Left VM with Shayne Alken, guardian, to reschedule our routine visit on Monday. Left instructions to call back with any questions or to make [facility] appointment with any urgent needs in the meantime.

## 2019-02-06 ENCOUNTER — Encounter: Payer: Self-pay | Admitting: Student in an Organized Health Care Education/Training Program

## 2019-04-13 ENCOUNTER — Other Ambulatory Visit: Payer: Self-pay | Admitting: Student in an Organized Health Care Education/Training Program

## 2019-04-13 DIAGNOSIS — I10 Essential (primary) hypertension: Secondary | ICD-10-CM

## 2019-06-07 ENCOUNTER — Other Ambulatory Visit: Payer: Self-pay | Admitting: Student in an Organized Health Care Education/Training Program

## 2019-06-07 DIAGNOSIS — Z1231 Encounter for screening mammogram for malignant neoplasm of breast: Secondary | ICD-10-CM

## 2019-07-25 ENCOUNTER — Ambulatory Visit: Payer: MEDICARE

## 2019-07-26 ENCOUNTER — Other Ambulatory Visit: Payer: Self-pay

## 2019-07-26 ENCOUNTER — Ambulatory Visit
Admission: RE | Admit: 2019-07-26 | Discharge: 2019-07-26 | Disposition: A | Discharge status: Home / Self Care | Payer: MEDICARE | Source: Ambulatory Visit | Attending: Student in an Organized Health Care Education/Training Program | Admitting: Student in an Organized Health Care Education/Training Program

## 2019-07-26 DIAGNOSIS — Z1231 Encounter for screening mammogram for malignant neoplasm of breast: Secondary | ICD-10-CM | POA: Diagnosis not present

## 2019-08-03 ENCOUNTER — Ambulatory Visit (INDEPENDENT_AMBULATORY_CARE_PROVIDER_SITE_OTHER): Payer: Medicare Other | Admitting: Internal Medicine

## 2019-08-03 ENCOUNTER — Other Ambulatory Visit: Payer: Self-pay | Admitting: Internal Medicine

## 2019-08-03 ENCOUNTER — Other Ambulatory Visit: Payer: Self-pay

## 2019-08-03 VITALS — BP 163/79 | HR 98 | Temp 98.9°F | Wt 151.3 lb

## 2019-08-03 DIAGNOSIS — H9211 Otorrhea, right ear: Secondary | ICD-10-CM | POA: Diagnosis present

## 2019-08-03 DIAGNOSIS — I10 Essential (primary) hypertension: Secondary | ICD-10-CM | POA: Diagnosis not present

## 2019-08-03 DIAGNOSIS — M1A0490 Idiopathic chronic gout, unspecified hand, without tophus (tophi): Secondary | ICD-10-CM | POA: Diagnosis not present

## 2019-08-03 MED ORDER — LISINOPRIL 20 MG PO TABS: 20.0000 mg | ORAL_TABLET | Freq: Every day | ORAL | Status: DC

## 2019-08-03 MED ORDER — CIPROFLOXACIN HCL 0.2 % OT SOLN: 0.2000 mL | Freq: Two times a day (BID) | OTIC | 0 refills | Status: AC

## 2019-08-03 MED ORDER — COLCHICINE 0.6 MG PO TABS: 0.6000 mg | ORAL_TABLET | Freq: Two times a day (BID) | ORAL | 0 refills | Status: DC

## 2019-08-03 NOTE — Progress Notes (Signed)
   CC: right hand swelling and right ear drainage  HPI:  [name] is a 58 y.o. female living with down syndrome, diabetes mellitus, essential htn, gout who presents for evaluation of ear drainage and right arm swelling. Please see problem based charting for evaluation, assessment, and plan. Patient was accompanied by her sister to appointment.   Past Medical History:  Diagnosis Date  . Diabetes mellitus   . Gout   . Hyperlipidemia   . Hypertension   . 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 She appears well-developed and well-nourished. No distress.  HENT:  Head: Normocephalic and atraumatic.  Right Ear: No lacerations. There is drainage (purulent). No swelling or tenderness. No foreign bodies. No mastoid tenderness. Tympanic membrane is bulging. Tympanic membrane is not injected, not scarred, not perforated and not erythematous. A middle ear effusion is present. No decreased hearing is noted.  Left Ear: No drainage, swelling or tenderness.  No middle ear effusion. No decreased hearing is noted.  Cardiovascular: Normal rate, regular rhythm and normal heart sounds.  Respiratory: Effort normal and breath sounds normal. No respiratory distress. She has no wheezes.  GI: Soft. Bowel sounds are normal. She exhibits no distension. There is no abdominal tenderness.  Musculoskeletal:        General: Edema (right hand including all digits. Warm to touch. ) present. No tenderness.  Neurological: She is alert and oriented to person, place, and time. No cranial nerve deficit. Coordination normal.  Skin: She is not diaphoretic. No erythema.       Assessment & Plan:   See  Encounters Tab for problem based charting.  Patient discussed with Dr. Philipp Ovens

## 2019-08-03 NOTE — Patient Instructions (Signed)
It was a pleasure to see you today Ms. Linda Lynn. Please make the following changes:  -increase lisinopril to 20mg  daily  -Follow with ENT for the right ear  -place ciprofloxacin otic drops in right ear twice daily  -start taking colchicine 0.6mg  twice daily till the right hand swelling decreases   If you have any questions or concerns, please call our clinic at [phone] between 9am-5pm and after hours call [phone] and ask for the internal medicine resident on call. If you feel you are having a medical emergency please call 911.   Thank you, we look forward to help you remain healthy!  Lars Mage, MD Internal Medicine PGY3

## 2019-08-03 NOTE — Telephone Encounter (Signed)
Please call caregiver back.  Caregiver [name] Engineer, maintenance requesting a different medication that Medicaid will cover.  Medicaid will not pay for Ciprofloxacin HCl 0.2 % otic solution  [id number] [place]

## 2019-08-03 NOTE — Assessment & Plan Note (Signed)
Patient's blood pressure during this visit was elevated at 155/100 and repeat was 163/79. The patient is currently taking lisinopril 10mg  qd.   BP Readings from Last 3 Encounters:  08/03/19 (!) 163/79  10/03/18 110/68  04/18/18 (!) 144/82   Assessment and plan  The patient's blood pressure during this visit is elevated. Will increase lisinopril to 20mg  qd.

## 2019-08-04 DIAGNOSIS — H9211 Otorrhea, right ear: Secondary | ICD-10-CM | POA: Insufficient documentation

## 2019-08-04 NOTE — Assessment & Plan Note (Signed)
Patient presented with a 1-2 week history of right ear drainage. The drainage is purulent material. She does not have any pain with manipulation of the ear. She does not seem to have any hearing deficits.   Assessment and plan  TM with effusion and purulent material. Patient with likely suppurative otitis media.   -prescribed ciprofloxacin otic solution  -ent referral urgent

## 2019-08-04 NOTE — Telephone Encounter (Signed)
Which pharmacy does she want it sent to?

## 2019-08-04 NOTE — Assessment & Plan Note (Signed)
  Patient presents with right hand swelling for the past 3 days. She has not done anything for the pain.   Examination of the right hand shows diffuse swelling in the right hand and all the digits. The hand is warm to touch. Difficult if there was tenderness to palpation as patient was repeating what the physician asked her.  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 Review of emr shows that the patient has had gout in this region prior. The patient was   -colchicine 0.6mg  bid prescribed

## 2019-08-04 NOTE — Telephone Encounter (Signed)
Returned call to Ulice Brilliant. No answer. Left message on VM requesting return call to confirm pharmacy. Hubbard Hartshorn, RN, BSN

## 2019-08-07 NOTE — Progress Notes (Signed)
Internal Medicine Clinic Attending  Case discussed with Dr. Chundi at the time of the visit.  We reviewed the resident's history and exam and pertinent patient test results.  I agree with the assessment, diagnosis, and plan of care documented in the resident's note. 

## 2019-08-10 ENCOUNTER — Other Ambulatory Visit: Payer: Self-pay | Admitting: Internal Medicine

## 2019-08-10 ENCOUNTER — Encounter: Payer: Self-pay | Admitting: Internal Medicine

## 2019-08-10 ENCOUNTER — Other Ambulatory Visit: Payer: Self-pay

## 2019-08-10 ENCOUNTER — Ambulatory Visit (INDEPENDENT_AMBULATORY_CARE_PROVIDER_SITE_OTHER): Payer: Medicare Other | Admitting: Internal Medicine

## 2019-08-10 VITALS — BP 153/84 | HR 72 | Temp 98.2°F | Wt 142.8 lb

## 2019-08-10 DIAGNOSIS — M1A0490 Idiopathic chronic gout, unspecified hand, without tophus (tophi): Secondary | ICD-10-CM | POA: Diagnosis not present

## 2019-08-10 DIAGNOSIS — H9211 Otorrhea, right ear: Secondary | ICD-10-CM | POA: Diagnosis not present

## 2019-08-10 DIAGNOSIS — Z23 Encounter for immunization: Secondary | ICD-10-CM

## 2019-08-10 DIAGNOSIS — Z0000 Encounter for general adult medical examination without abnormal findings: Secondary | ICD-10-CM | POA: Diagnosis not present

## 2019-08-10 DIAGNOSIS — I10 Essential (primary) hypertension: Secondary | ICD-10-CM

## 2019-08-10 MED ORDER — AMLODIPINE BESYLATE 5 MG PO TABS: 5.0000 mg | ORAL_TABLET | Freq: Every day | ORAL | 1 refills | Status: DC

## 2019-08-10 NOTE — Patient Instructions (Addendum)
It was a pleasure to see you today Ms. Linda Lynn. Please make the following changes:  -please stop colchicine -complete course of ciprofloxacin  -please start amlodipine 5mg  daily for your blood pressure in addition to lisinopril 20mg  daily -follow up with ent  -follow up with Dr. Evette Doffing in 4 weeks  If you have any questions or concerns, please call our clinic at [phone] between 9am-5pm and after hours call [phone] and ask for the internal medicine resident on call. If you feel you are having a medical emergency please call 911.   Thank you, we look forward to help you remain healthy!  Lars Mage, MD Internal Medicine PGY3

## 2019-08-10 NOTE — Assessment & Plan Note (Signed)
  Patient was evaluated for purulent drainage of right ear on 9/17 and was prescribed ciprofloxacin drops bid for 14 days. The drainage has decreased slightly. Patient and patient's sister feel that it has improved. They are scheduled to see ENT soon.   Assessment and plan  Patient's purulent otitis media appears to be improving. Does not have changes consistent with ruptured TM.   Recommended finishing ciprofloxacin otic drops for total 14 day duration. Follow up with ENT.

## 2019-08-10 NOTE — Assessment & Plan Note (Signed)
The patient's blood pressure during this visit was 177/97 and repeat 153/84. The patient is currently taking lisinopril 20mg  qd which she is adherent to. Her last blood pressure visits are   BP Readings from Last 3 Encounters:  08/10/19 (!) 153/84  08/03/19 (!) 163/79  10/03/18 110/68  Assessment and Plan Patient's blood pressure is not at goal so will add amlodipine 5 mg qd.   -continue lisinopril 20mg  qd  -recheck bmp as there was an increase in creatinine to 1.52 from baseline 0.8-1.1.

## 2019-08-10 NOTE — Assessment & Plan Note (Signed)
Patient was given influenza vaccination during this encounter.  

## 2019-08-10 NOTE — Assessment & Plan Note (Signed)
Patient's right hand swelling has decreased in size and patient's sister states that the patient is able to move the hand more easily. Patient was started on colchicine 0.6mg  bid at last visit 1 week ago.   Assesment and plan  Patient's acute gout flare appears to have resolved. Stop colchicine. Will follow up in 4 weeks at which time will check uric acid and determine if should be placed on prophylactic medication.

## 2019-08-10 NOTE — Progress Notes (Signed)
   CC: Right ear and right arm swelling follow up  HPI:  [name] is a 58 y.o. female living with down syndrome, dm, essential htn, gout who presented for follow up of right arm swelling and left ear follow up. Please see problem based charting for evaluation, assessment, and plan.   Past Medical History:  Diagnosis Date  . Diabetes mellitus   . Gout   . Hyperlipidemia   . Hypertension   . 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 She appears well-developed and well-nourished. No distress.  HENT:  Head: Normocephalic and atraumatic.  Right Ear: No lacerations. There is drainage (purulent). No swelling or tenderness. No foreign bodies. Tympanic membrane is not bulging. No middle ear effusion. No decreased hearing is noted.  Left Ear: No drainage, swelling or tenderness. Tympanic membrane is not bulging. No decreased hearing is noted.  Cardiovascular: Normal rate, regular rhythm and normal heart sounds.  Respiratory: Effort normal and breath sounds normal. No respiratory distress. She has no wheezes.  GI: Soft. Bowel sounds are normal. She exhibits no distension.  Musculoskeletal:        General: No edema.     Comments: Good rom in right hand, decrease in swelling at wrist and digits. No warmth to touch  Neurological: She is alert and oriented to person, place, and time.  Skin: She is not diaphoretic.  Psychiatric: She has a normal mood and affect. Her behavior is normal. Judgment and thought content normal.    Assessment & Plan:   See Encounters Tab for problem based  charting.  Patient discussed with Dr. Evette Doffing

## 2019-08-11 LAB — BMP8+ANION GAP
Anion Gap: 13 mmol/L (ref 10.0–18.0)
BUN/Creatinine Ratio: 21 (ref 9–23)
BUN: 19 mg/dL (ref 6–24)
CO2: 26 mmol/L (ref 20–29)
Calcium: 9.4 mg/dL (ref 8.7–10.2)
Chloride: 102 mmol/L (ref 96–106)
Creatinine, Ser: 0.92 mg/dL (ref 0.57–1.00)
GFR calc Af Amer: 79 mL/min/{1.73_m2} (ref >59)
GFR calc non Af Amer: 69 mL/min/{1.73_m2} (ref >59)
Glucose: 135 mg/dL — ABNORMAL HIGH (ref 65–99)
Potassium: 4 mmol/L (ref 3.5–5.2)
Sodium: 141 mmol/L (ref 134–144)

## 2019-08-11 NOTE — Progress Notes (Signed)
Internal Medicine Clinic Attending  I saw and evaluated the patient.  I personally confirmed the key portions of the history and exam documented by Dr. Chundi and I reviewed pertinent patient test results.  The assessment, diagnosis, and plan were formulated together and I agree with the documentation in the resident's note. 

## 2019-08-24 ENCOUNTER — Other Ambulatory Visit: Payer: Self-pay | Admitting: Student in an Organized Health Care Education/Training Program

## 2019-10-31 ENCOUNTER — Other Ambulatory Visit: Payer: Self-pay | Admitting: Student in an Organized Health Care Education/Training Program

## 2019-12-13 ENCOUNTER — Encounter: Payer: Self-pay | Admitting: Internal Medicine

## 2019-12-13 ENCOUNTER — Other Ambulatory Visit: Payer: Self-pay

## 2019-12-13 ENCOUNTER — Ambulatory Visit (INDEPENDENT_AMBULATORY_CARE_PROVIDER_SITE_OTHER): Payer: MEDICARE | Admitting: Internal Medicine

## 2019-12-13 VITALS — BP 149/73 | HR 74 | Temp 98.4°F | Ht 59.0 in | Wt 134.4 lb

## 2019-12-13 DIAGNOSIS — I10 Essential (primary) hypertension: Secondary | ICD-10-CM | POA: Diagnosis not present

## 2019-12-13 DIAGNOSIS — M1A0490 Idiopathic chronic gout, unspecified hand, without tophus (tophi): Secondary | ICD-10-CM | POA: Diagnosis not present

## 2019-12-13 MED ORDER — HYDROCHLOROTHIAZIDE 12.5 MG PO CAPS: 12.5000 mg | ORAL_CAPSULE | Freq: Every day | ORAL | 0 refills | Status: DC

## 2019-12-13 NOTE — Assessment & Plan Note (Signed)
With a history of recurrent gout attacks. Caregiver states she at least has two per year. They are interested in prophylactic medications. She is not having an acute flare. We will check a uric acid level. We will then initiate allopurinol with colchicine and titrate to a level of less than 6.5. We will stop the colchicine when she is at optimal dose of allopurinol.

## 2019-12-13 NOTE — Progress Notes (Signed)
   CC: Gout, HTN  HPI:  Ms.Linda Linda Lynn is a 59 y.o. female with PMHx listed below presenting for Gout, HTN. Please see the A&P for the status of the patient's chronic medical problems.  Past Medical History:  Diagnosis Date  . Diabetes mellitus   . Gout   . Hyperlipidemia   . Hypertension   . 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  Physical Exam: Vitals:   12/13/19 1010  BP: (!) 149/73  Pulse: 74  Temp: 98.4 F (36.9 C)  TempSrc: Oral  SpO2: 99%  Weight: 134 lb 6.4 oz (61 kg)  Height: 4\' 11"  (1.499 m)   General: Well nourished female in no acute distress Pulm: Good air movement with no wheezing or crackles  CV: RRR, no murmurs, no rubs   Assessment & Plan:   See Encounters Tab for problem based charting.  Patient discussed with Dr. 

## 2019-12-13 NOTE — Patient Instructions (Signed)
Thank you for allowing [place] to provide your care. Today we are adding a medication called hydrochlorothiazide for your blood pressure. I would like you to come back in four weeks to make sure your blood work looks okay.  We are also checking a uric acid level. This is related to your gout. Based on the degree of elevation we will likely need to add a new medication called allopurinol.  Please come back to see [place] in four weeks for blood work and three months to see Dr. Oswaldo Done.

## 2019-12-13 NOTE — Assessment & Plan Note (Signed)
Patient with uncontrolled hypertension. Currently on amlodipine five and lisinopril 20. She is not experiencing any adverse effects from these medications. She denies orthostatic symptoms.  I reviewed her last BMP that showed stable renal function and potassium.  A/P: - Add HCTZ 12.5 mg QD with repeat BMP in 4 weeks  - Continue Amlodipine 5 mg QD and Lisinopril 20 mg QD

## 2019-12-14 ENCOUNTER — Other Ambulatory Visit: Payer: Self-pay | Admitting: *Deleted

## 2019-12-14 ENCOUNTER — Telehealth: Payer: Self-pay | Admitting: Internal Medicine

## 2019-12-14 DIAGNOSIS — M1A0490 Idiopathic chronic gout, unspecified hand, without tophus (tophi): Secondary | ICD-10-CM

## 2019-12-14 LAB — URIC ACID: Uric Acid: 7.3 mg/dL — ABNORMAL HIGH (ref 3.0–7.2)

## 2019-12-14 MED ORDER — COLCHICINE 0.6 MG PO TABS: 0.6000 mg | ORAL_TABLET | Freq: Every day | ORAL | 2 refills | Status: DC

## 2019-12-14 MED ORDER — ALLOPURINOL 100 MG PO TABS: 100.0000 mg | ORAL_TABLET | Freq: Every day | ORAL | 0 refills | Status: DC

## 2019-12-14 NOTE — Progress Notes (Signed)
Internal Medicine Clinic Attending  Case discussed with Dr. Helberg at the time of the visit.  We reviewed the resident's history and exam and pertinent patient test results.  I agree with the assessment, diagnosis, and plan of care documented in the resident's note.    

## 2019-12-14 NOTE — Telephone Encounter (Signed)
Called the patient's sister Linda Lynn to discuss her lab results. Uric acid is elevated 7.5. We will start allopurinol 100 mg once daily in addition to colchicine 0.6 mg QD. She will need to come back in for repeat uric acid levels in a couple weeks. All questions and concerns addressed.  Thank you.

## 2019-12-14 NOTE — Telephone Encounter (Signed)
[facility]; received rx with qty of 890. Thanks

## 2019-12-14 NOTE — Telephone Encounter (Signed)
Per dr helburg appt in 2 weeks

## 2019-12-18 NOTE — Telephone Encounter (Signed)
Pt is  sch on 12/12/2019 to repeat her labs.

## 2019-12-23 ENCOUNTER — Other Ambulatory Visit: Payer: Self-pay | Admitting: Student in an Organized Health Care Education/Training Program

## 2019-12-23 DIAGNOSIS — E119 Type 2 diabetes mellitus without complications: Secondary | ICD-10-CM

## 2020-01-12 ENCOUNTER — Other Ambulatory Visit: Payer: Self-pay

## 2020-01-12 ENCOUNTER — Other Ambulatory Visit (INDEPENDENT_AMBULATORY_CARE_PROVIDER_SITE_OTHER): Payer: Medicare Other

## 2020-01-12 DIAGNOSIS — I10 Essential (primary) hypertension: Secondary | ICD-10-CM | POA: Diagnosis not present

## 2020-01-13 LAB — BMP8+ANION GAP
Anion Gap: 14 mmol/L (ref 10.0–18.0)
BUN/Creatinine Ratio: 18 (ref 9–23)
BUN: 20 mg/dL (ref 6–24)
CO2: 26 mmol/L (ref 20–29)
Calcium: 9.5 mg/dL (ref 8.7–10.2)
Chloride: 101 mmol/L (ref 96–106)
Creatinine, Ser: 1.09 mg/dL — ABNORMAL HIGH (ref 0.57–1.00)
GFR calc Af Amer: 64 mL/min/{1.73_m2} (ref >59)
GFR calc non Af Amer: 56 mL/min/{1.73_m2} — ABNORMAL LOW (ref >59)
Glucose: 102 mg/dL — ABNORMAL HIGH (ref 65–99)
Potassium: 3.7 mmol/L (ref 3.5–5.2)
Sodium: 141 mmol/L (ref 134–144)

## 2020-03-03 ENCOUNTER — Other Ambulatory Visit: Payer: Self-pay | Admitting: Internal Medicine

## 2020-03-03 DIAGNOSIS — I10 Essential (primary) hypertension: Secondary | ICD-10-CM

## 2020-03-25 ENCOUNTER — Ambulatory Visit (INDEPENDENT_AMBULATORY_CARE_PROVIDER_SITE_OTHER): Payer: MEDICARE | Admitting: Student in an Organized Health Care Education/Training Program

## 2020-03-25 ENCOUNTER — Telehealth: Payer: Self-pay | Admitting: *Deleted

## 2020-03-25 ENCOUNTER — Encounter: Payer: Self-pay | Admitting: Student in an Organized Health Care Education/Training Program

## 2020-03-25 VITALS — BP 105/72 | HR 88 | Temp 97.9°F | Ht 59.0 in | Wt 125.8 lb

## 2020-03-25 DIAGNOSIS — Z0000 Encounter for general adult medical examination without abnormal findings: Secondary | ICD-10-CM | POA: Diagnosis not present

## 2020-03-25 DIAGNOSIS — M109 Gout, unspecified: Secondary | ICD-10-CM

## 2020-03-25 DIAGNOSIS — I10 Essential (primary) hypertension: Secondary | ICD-10-CM

## 2020-03-25 DIAGNOSIS — Z23 Encounter for immunization: Secondary | ICD-10-CM | POA: Diagnosis not present

## 2020-03-25 DIAGNOSIS — E1169 Type 2 diabetes mellitus with other specified complication: Secondary | ICD-10-CM

## 2020-03-25 DIAGNOSIS — Z79899 Other long term (current) drug therapy: Secondary | ICD-10-CM | POA: Diagnosis not present

## 2020-03-25 DIAGNOSIS — Z7984 Long term (current) use of oral hypoglycemic drugs: Secondary | ICD-10-CM | POA: Diagnosis not present

## 2020-03-25 DIAGNOSIS — M1A0490 Idiopathic chronic gout, unspecified hand, without tophus (tophi): Secondary | ICD-10-CM

## 2020-03-25 LAB — POCT GLYCOSYLATED HEMOGLOBIN (HGB A1C): Hemoglobin A1C: 6.4 % — AB (ref 4.0–5.6)

## 2020-03-25 LAB — GLUCOSE, CAPILLARY: Glucose-Capillary: 128 mg/dL — ABNORMAL HIGH (ref 70–99)

## 2020-03-25 MED ORDER — FLUTICASONE PROPIONATE 50 MCG/ACT NA SUSP: 1.0000 | Freq: Every day | NASAL | 2 refills | Status: DC

## 2020-03-25 MED ORDER — AMLODIPINE-VALSARTAN-HCTZ 5-160-12.5 MG PO TABS: 1.0000 | ORAL_TABLET | Freq: Every day | ORAL | 3 refills | Status: DC

## 2020-03-25 NOTE — Telephone Encounter (Signed)
Ok. Can you help me figure out which Part D company she uses so I can look up the preferred drug list? I really want to get her a combination hypertension medicine to make things more simple for her.

## 2020-03-25 NOTE — Assessment & Plan Note (Signed)
Hemoglobin A1c 6.4%, excellent control.  Continue with Metformin 500 mg twice daily.  Foot exam normal today.  Continue with pravastatin 20 mg for primary prevention of ischemic event.  Blood pressure well controlled.

## 2020-03-25 NOTE — Assessment & Plan Note (Addendum)
Blood pressure well controlled today.  She is on 3 agents in separate tablets, I am going to convert these over to a combination medicine for ease of dosing.  Plan is to start generic Exforge, which is combination amlodipine, valsartan, and HCTZ.  This should be covered on her Medicaid.  We talked about discontinuing the other blood pressure medications as soon as she is finished with her current supply.  ADDENDUM:  Learned from pharmacy that patient does not have Medicaid coverage. She uses [facility] for medications. They have a combo amlodipine-valsartan on formulary, but not Exforge. Will continue with HCTZ separately.

## 2020-03-25 NOTE — Telephone Encounter (Signed)
[facility], talked to Marchelle Folks - stated they had to run the rx thru pt's Medicare Part D.

## 2020-03-25 NOTE — Telephone Encounter (Signed)
Call from [place] - stated combo med Amlodipine/Valsartan/HCTZ is not covered by pt's insurance. Re-send rxs as separate meds or different med Thanks

## 2020-03-25 NOTE — Assessment & Plan Note (Signed)
Doing well on allopurinol with no recent gout flares.  Now on prophylactic dosing.  Having some diarrhea which is likely related to daily colchicine which is just prophylactic while getting therapeutic on allopurinol.  Is been 2 months, so I think it is okay to stop the colchicine.  Continue with allopurinol.  Recheck uric acid at next blood draw but I anticipate it will be therapeutic at that time.

## 2020-03-25 NOTE — Telephone Encounter (Signed)
Can you clarify with Greig Castilla, I thought the patient had drug coverage through Medicaid. I looked up the preferred drug list on medicaid, and this medicine was preferred. Can he let me know if her drug coverage is something else so I can look for an alternative combination pill?

## 2020-03-25 NOTE — Progress Notes (Signed)
   Assessment and Plan:  See Encounters tab for problem-based medical decision making.   __________________________________________________________  HPI:   59 year old person living with Down syndrome here for follow-up of hypertension and diabetes.  Accompanied by her Sister [name] who is one of her 24-hour caregivers.  Reports doing well at home, still independent in many activities of daily living like eating, dressing, and toileting.  No falls, no behavior disturbances, no safety concerns.  Not very much activity, or exercise lately due to Covid restrictions.  Has not had vaccine yet.  No recent illnesses, no fevers or chills.  Good adherence with medications.  Having some diarrhea which started over the last few weeks, around the time of starting daily colchicine.  No gout flares recently.  __________________________________________________________  Problem List: Patient Active Problem List   Diagnosis Date Noted  . Type 2 diabetes mellitus with other specified complication (HCC) 09/25/2006    Priority: High  . Down's syndrome 09/25/2006    Priority: High  . Hyperlipidemia associated with type 2 diabetes mellitus (HCC) 04/20/2012    Priority: Medium  . Gout 08/06/2010    Priority: Medium  . Bradycardia 01/10/2018    Priority: Low  . Blindness 04/06/2016    Priority: Low  . Healthcare maintenance 08/06/2011    Priority: Low  . Essential hypertension 09/25/2006    Priority: Low    Medications: Reconciled today in Epic __________________________________________________________  Physical Exam:  Vital Signs: Vitals:   03/25/20 0919  BP: 105/72  Pulse: 88  Temp: 97.9 F (36.6 C)  SpO2: 98%  Weight: 125 lb 12.8 oz (57.1 kg)  Height: 4\' 11"  (1.499 m)    Gen: Well appearing, NAD ENT: OP clear without erythema or exudate.  Poor dentition. Neck: No cervical LAD, No thyromegaly or nodules CV: RRR, no murmurs Pulm: Normal effort, CTA throughout, no wheezing.  Ext:  Warm, no edema, normal joints

## 2020-03-25 NOTE — Patient Instructions (Signed)
Is great seeing you today in clinic.  We scheduled you [facility] [address] for today at 445  You can discontinue colchicine which was a medicine for gout.  It could be causing the diarrhea.  I changed your blood pressure medications from 3 separate tablets, into 1 combination pill.  This new pill has similar medicines in it.  We can start this medicine whenever available.  It should be preferred on her Medicaid coverage.  We talked about trying to increase activity at home.

## 2020-03-25 NOTE — Assessment & Plan Note (Signed)
Up-to-date.  I signed her up for a Covid vaccine with a [facility] for today.

## 2020-03-27 MED ORDER — HYDROCHLOROTHIAZIDE 12.5 MG PO CAPS: 12.5000 mg | ORAL_CAPSULE | Freq: Every day | ORAL | 3 refills | Status: DC

## 2020-03-27 MED ORDER — AMLODIPINE BESYLATE-VALSARTAN 5-160 MG PO TABS: 1.0000 | ORAL_TABLET | Freq: Every day | ORAL | 3 refills | Status: DC

## 2020-03-27 NOTE — Telephone Encounter (Signed)
Pt's PART D  Is thru [employer] ID# [id number]. Called [facility] they did an [facility]. Pt does not have medicaid  [facility] [address] and spoke to pharmacist alan and gave info Amlodipine/valsartan can be put together but hctz will need to be separate or all 3 can be done separate. Called dr Oswaldo Done and gave him the info

## 2020-03-27 NOTE — Addendum Note (Signed)
Addended by: Erlinda Hong T on: 03/27/2020 10:58 AM   Modules accepted: Orders

## 2020-04-13 DIAGNOSIS — Z23 Encounter for immunization: Secondary | ICD-10-CM | POA: Diagnosis not present

## 2020-04-19 ENCOUNTER — Other Ambulatory Visit: Payer: Self-pay | Admitting: Student in an Organized Health Care Education/Training Program

## 2020-04-19 DIAGNOSIS — I10 Essential (primary) hypertension: Secondary | ICD-10-CM

## 2020-05-07 ENCOUNTER — Other Ambulatory Visit: Payer: Self-pay | Admitting: Student in an Organized Health Care Education/Training Program

## 2020-05-07 ENCOUNTER — Other Ambulatory Visit: Payer: Self-pay | Admitting: Internal Medicine

## 2020-05-07 DIAGNOSIS — I10 Essential (primary) hypertension: Secondary | ICD-10-CM

## 2020-05-15 ENCOUNTER — Other Ambulatory Visit: Payer: Self-pay | Admitting: Student in an Organized Health Care Education/Training Program

## 2020-05-15 DIAGNOSIS — I10 Essential (primary) hypertension: Secondary | ICD-10-CM

## 2020-06-17 ENCOUNTER — Other Ambulatory Visit: Payer: Self-pay | Admitting: Student in an Organized Health Care Education/Training Program

## 2020-06-17 DIAGNOSIS — Z1231 Encounter for screening mammogram for malignant neoplasm of breast: Secondary | ICD-10-CM

## 2020-06-25 ENCOUNTER — Other Ambulatory Visit: Payer: Self-pay | Admitting: Student in an Organized Health Care Education/Training Program

## 2020-06-25 DIAGNOSIS — E119 Type 2 diabetes mellitus without complications: Secondary | ICD-10-CM

## 2020-07-29 ENCOUNTER — Ambulatory Visit
Admission: RE | Admit: 2020-07-29 | Discharge: 2020-07-29 | Disposition: A | Discharge status: Home / Self Care | Payer: MEDICARE | Source: Ambulatory Visit | Attending: Student in an Organized Health Care Education/Training Program | Admitting: Student in an Organized Health Care Education/Training Program

## 2020-07-29 ENCOUNTER — Other Ambulatory Visit: Payer: Self-pay

## 2020-07-29 DIAGNOSIS — Z1231 Encounter for screening mammogram for malignant neoplasm of breast: Secondary | ICD-10-CM | POA: Diagnosis not present

## 2020-08-14 ENCOUNTER — Other Ambulatory Visit: Payer: Self-pay

## 2020-08-14 NOTE — Telephone Encounter (Signed)
amLODipine-valsartan (EXFORGE) 5-160 MG tablet, refill request @  [id number] [place] [place] - [address] AT [facility] OF [address] Phone:  [phone]  Fax:  [phone]

## 2020-08-14 NOTE — Telephone Encounter (Signed)
#  90 with [facility]ills [facility] [address] on 03/27/2020. Left detailed message on [facility] [employer] self-identified VM with this information. Requested she call Pharmacy and have [facility] [address]. Patient has [facility] different [facility] Pharm[facility] listed. Asked [name] to call back to see which pharmacies can be removed so Rx does not go to incorrect pharmacy going forward. Kinnie Feil, BSN, RN-BC

## 2020-10-13 ENCOUNTER — Other Ambulatory Visit: Payer: Self-pay | Admitting: Student in an Organized Health Care Education/Training Program

## 2020-10-27 ENCOUNTER — Other Ambulatory Visit: Payer: Self-pay | Admitting: Student in an Organized Health Care Education/Training Program

## 2020-11-04 ENCOUNTER — Encounter: Payer: Self-pay | Admitting: Student in an Organized Health Care Education/Training Program

## 2020-11-04 ENCOUNTER — Other Ambulatory Visit: Payer: Self-pay

## 2020-11-04 ENCOUNTER — Ambulatory Visit (INDEPENDENT_AMBULATORY_CARE_PROVIDER_SITE_OTHER): Payer: MEDICARE | Admitting: Student in an Organized Health Care Education/Training Program

## 2020-11-04 VITALS — BP 98/80 | HR 82 | Temp 97.7°F | Wt 128.3 lb

## 2020-11-04 DIAGNOSIS — Q909 Down syndrome, unspecified: Secondary | ICD-10-CM

## 2020-11-04 DIAGNOSIS — E785 Hyperlipidemia, unspecified: Secondary | ICD-10-CM | POA: Diagnosis not present

## 2020-11-04 DIAGNOSIS — Z23 Encounter for immunization: Secondary | ICD-10-CM

## 2020-11-04 DIAGNOSIS — I10 Essential (primary) hypertension: Secondary | ICD-10-CM | POA: Diagnosis not present

## 2020-11-04 DIAGNOSIS — M1A0490 Idiopathic chronic gout, unspecified hand, without tophus (tophi): Secondary | ICD-10-CM

## 2020-11-04 DIAGNOSIS — E1169 Type 2 diabetes mellitus with other specified complication: Secondary | ICD-10-CM | POA: Diagnosis not present

## 2020-11-04 DIAGNOSIS — Z0000 Encounter for general adult medical examination without abnormal findings: Secondary | ICD-10-CM

## 2020-11-04 LAB — POCT GLYCOSYLATED HEMOGLOBIN (HGB A1C): Hemoglobin A1C: 6 % — AB (ref 4.0–5.6)

## 2020-11-04 LAB — GLUCOSE, CAPILLARY: Glucose-Capillary: 136 mg/dL — ABNORMAL HIGH (ref 70–99)

## 2020-11-04 NOTE — Assessment & Plan Note (Addendum)
Blood pressure well controlled at 98/80 today.  No symptoms of dizziness, but I want to avoid a fall.  This is probably a little too aggressive.  Plan to discontinue hydrochlorothiazide today.  Continue with amlodipine valsartan combination pill.  Follow-up in 6 months, goal blood pressure is less than 130/80.

## 2020-11-04 NOTE — Patient Instructions (Signed)
It was great seeing you today in clinic.  Your blood pressure was excellent, actually on the low side at 98/80.  I think it is okay to stop taking the hydrochlorothiazide at this time.  Please continue all your other medications.  We will check on your blood pressure in 6 months when we next see you in the clinic.

## 2020-11-04 NOTE — Assessment & Plan Note (Signed)
No further episodes of gout.  Planning to continue with allopurinol 100 mg daily.  Check uric acid today.

## 2020-11-04 NOTE — Progress Notes (Signed)
   Assessment and Plan:  See Encounters tab for problem-based medical decision making.   __________________________________________________________  HPI:   59 year old son living with Down syndrome here for follow-up of hypertension and diabetes.  Patient has no complaints today.  Accompanied by family member who helps her at home.  They were port no recent illnesses, no hospitalizations, no emergency department visits.  No fevers or chills, no infectious illnesses.  Vear denies any chest pain, no shortness of breath, no abdominal pain or fullness.  She requires some assistance with her activities of daily living, family says that she needs to be pointed in the right direction but then she can mostly do things on her own.  She is legally blind, but has had no falls.  She knows very thin is in the house, very familiar with her environment.  No concerns about safety, no wandering, no anger or aggressive behavior.  Therefore good adherence with her medications, no adverse side effects.  Is eating and drinking well, goes for a walk outside the house a few times a week for exercise.  __________________________________________________________  Problem List: Patient Active Problem List   Diagnosis Date Noted  . Type 2 diabetes mellitus with other specified complication (HCC) 09/25/2006    Priority: High  . Down's syndrome 09/25/2006    Priority: High  . Hyperlipidemia associated with type 2 diabetes mellitus (HCC) 04/20/2012    Priority: Medium  . Gout 08/06/2010    Priority: Medium  . Bradycardia 01/10/2018    Priority: Low  . Blindness 04/06/2016    Priority: Low  . Healthcare maintenance 08/06/2011    Priority: Low  . Essential hypertension 09/25/2006    Priority: Low    Medications: Reconciled today in Epic __________________________________________________________  Physical Exam:  Vital Signs: Vitals:   11/04/20 1014  BP: 98/80  Pulse: 82  Temp: 97.7 F (36.5 C)   TempSrc: Oral  SpO2: 100%  Weight: 128 lb 4.8 oz (58.2 kg)    Gen: Well appearing, NAD Neck: No cervical LAD, No thyromegaly or nodules CV: RRR, 2 out of 6 early systolic murmur best heard at the left upper sternal border Pulm: Normal effort, CTA throughout, no wheezing Abd: Soft, NT, ND Ext: Warm, no edema, normal joints

## 2020-11-04 NOTE — Assessment & Plan Note (Signed)
Up-to-date on age-appropriate cancer screening.  Received 2 doses of mRNA vaccine against SARS-CoV-2 around June, talked about doing a booster vaccine now.  Gave her a flu vaccine today.

## 2020-11-04 NOTE — Assessment & Plan Note (Signed)
No ischemic events.  Has been on cholesterol-lowering medication since 2017 for primary prevention of ischemic heart disease.  Plan to check lipids today.  Continue with pravastatin 20 mg daily.

## 2020-11-04 NOTE — Assessment & Plan Note (Addendum)
Functionally stable, dependent on family for most activities of daily living.  Cognitive impairment is stable, poor memory.  No safety concerns at this time.  We talked about increasing exercise, time outside the house.  She shows no signs/symptoms of valvular heart disease or hematologic disorder.We will check a TSH today to screen for thyroid disease, has been about 6 years since we had done that.

## 2020-11-04 NOTE — Assessment & Plan Note (Addendum)
A1c under excellent control at 6.0%.  Planning to continue with Metformin 500 mg twice daily.  Will check a vitamin B12 level today to rule out deficiency as a result of Metformin.  We will continue with pravastatin for primary prevention of ischemic heart disease.  Checking a urine microalbumin today as well.

## 2020-11-05 ENCOUNTER — Telehealth: Payer: Self-pay | Admitting: Student in an Organized Health Care Education/Training Program

## 2020-11-05 DIAGNOSIS — M1A0490 Idiopathic chronic gout, unspecified hand, without tophus (tophi): Secondary | ICD-10-CM

## 2020-11-05 LAB — BMP8+ANION GAP
Anion Gap: 17 mmol/L (ref 10.0–18.0)
BUN/Creatinine Ratio: 24 — ABNORMAL HIGH (ref 9–23)
BUN: 29 mg/dL — ABNORMAL HIGH (ref 6–24)
CO2: 25 mmol/L (ref 20–29)
Calcium: 8.8 mg/dL (ref 8.7–10.2)
Chloride: 102 mmol/L (ref 96–106)
Creatinine, Ser: 1.22 mg/dL — ABNORMAL HIGH (ref 0.57–1.00)
GFR calc Af Amer: 56 mL/min/{1.73_m2} — ABNORMAL LOW (ref >59)
GFR calc non Af Amer: 49 mL/min/{1.73_m2} — ABNORMAL LOW (ref >59)
Glucose: 165 mg/dL — ABNORMAL HIGH (ref 65–99)
Potassium: 3.8 mmol/L (ref 3.5–5.2)
Sodium: 144 mmol/L (ref 134–144)

## 2020-11-05 LAB — TSH: TSH: 0.488 u[IU]/mL (ref 0.450–4.500)

## 2020-11-05 LAB — VITAMIN B12: Vitamin B-12: 883 pg/mL (ref 232–1245)

## 2020-11-05 LAB — LIPID PANEL
Chol/HDL Ratio: 2.9 ratio (ref 0.0–4.4)
Cholesterol, Total: 192 mg/dL (ref 100–199)
HDL: 67 mg/dL (ref >39)
LDL Chol Calc (NIH): 106 mg/dL — ABNORMAL HIGH (ref 0–99)
Triglycerides: 106 mg/dL (ref 0–149)
VLDL Cholesterol Cal: 19 mg/dL (ref 5–40)

## 2020-11-05 LAB — MICROALBUMIN / CREATININE URINE RATIO
Creatinine, Urine: 76.3 mg/dL
Microalb/Creat Ratio: 4 mg/g creat (ref 0–29)
Microalbumin, Urine: 3.3 ug/mL

## 2020-11-05 LAB — URIC ACID: Uric Acid: 9.3 mg/dL — ABNORMAL HIGH (ref 3.0–7.2)

## 2020-11-05 NOTE — Assessment & Plan Note (Signed)
On further questioning, I learned that the patient stopped taking allopurinol many months ago due to confusion about the medication list. We will plan to continue holding allopurinol for now as she has not had any further gout flares this year. Can readdress in the future if needed.

## 2020-11-05 NOTE — Telephone Encounter (Signed)
I spoke with Shayne Alken, patient's caretaker. There is some confusion about the gout medication. We decided to stop colchicine back in May, but it seems like they stopped both allopurinol and colchicine at that time. Uric acid is elevated. There have been no gout flares. I think there is value to limiting the pill burden here, especially because there is some confusion about the medication list. We decided to hold off on restarting allopurinol, if the patient has more gout flares in the future, or worsening renal disease, we can restart allopurinol at that time.

## 2020-12-01 IMAGING — MG MM DIGITAL SCREENING BILAT W/ TOMO W/ CAD
6 of 10 series · 6 of 30 positions shown · non-contrast
Comparison: Previous exam(s).

CLINICAL DATA: Screening.

EXAM:
DIGITAL SCREENING BILATERAL MAMMOGRAM WITH TOMO AND CAD

[L MLO synth-2D]
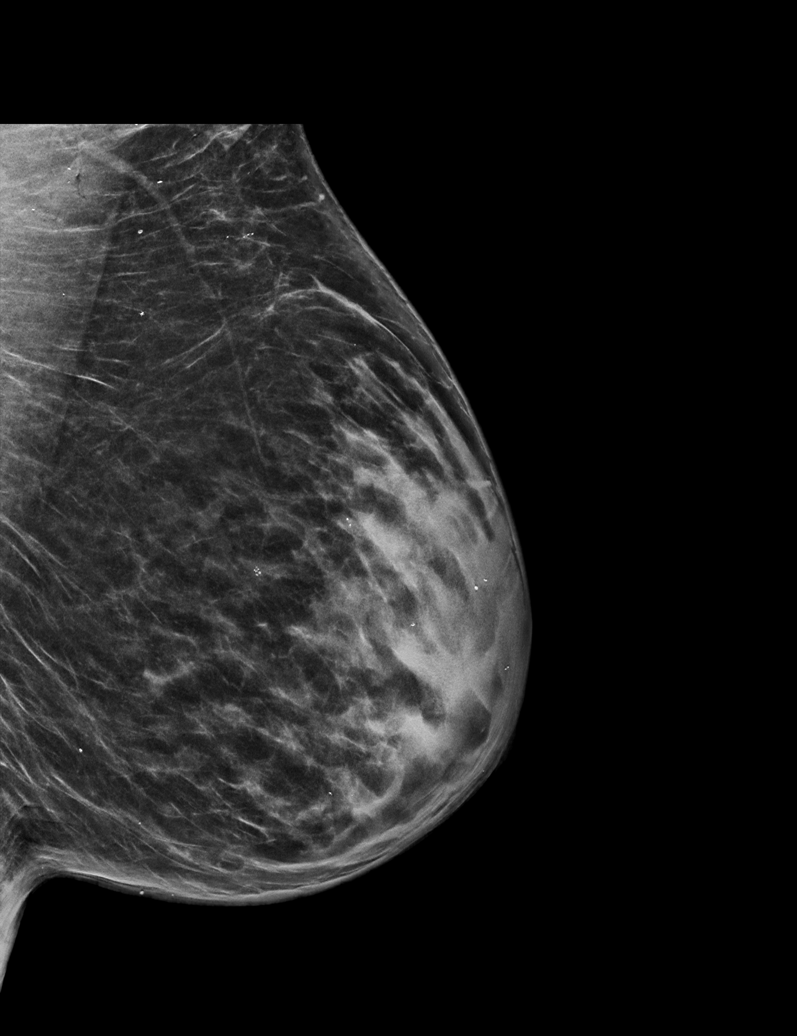

[L CC synth-2D (1 of 2)]
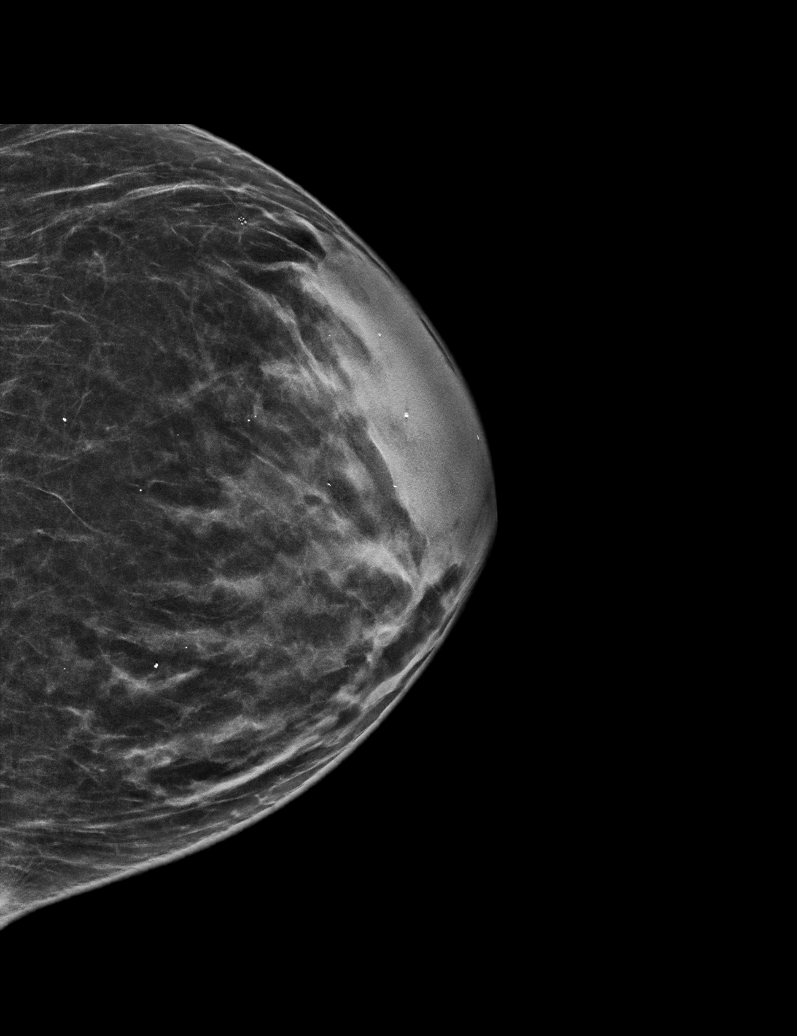

[R MLO synth-2D]
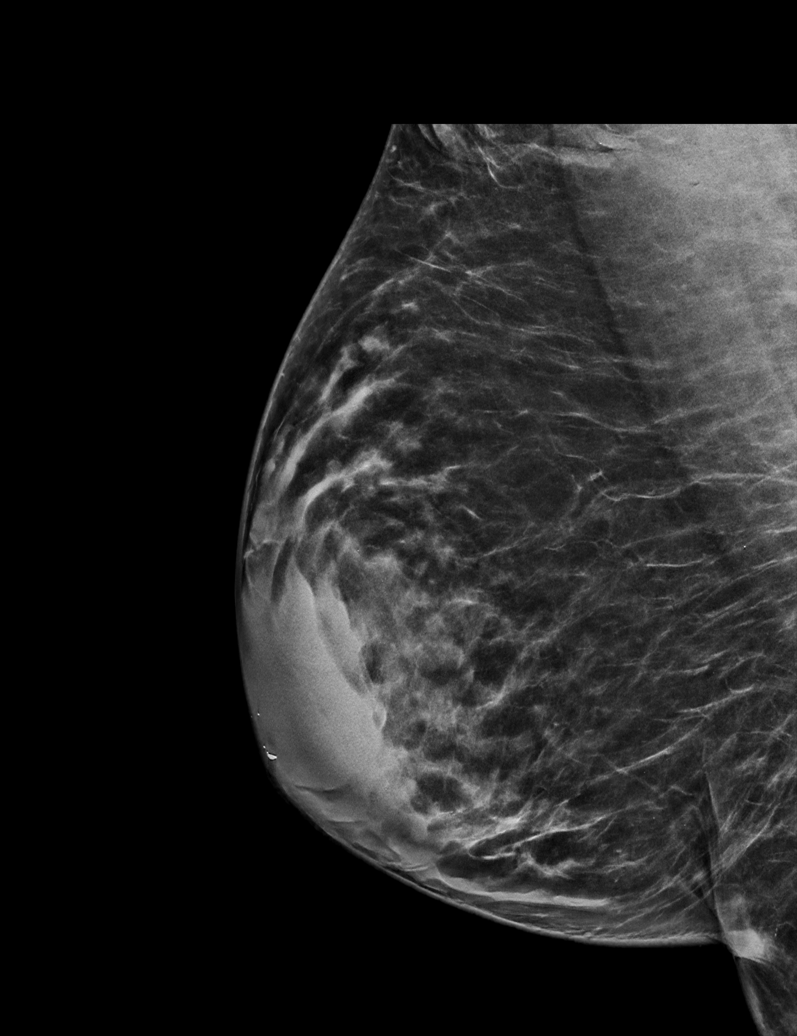

[R CC synth-2D]
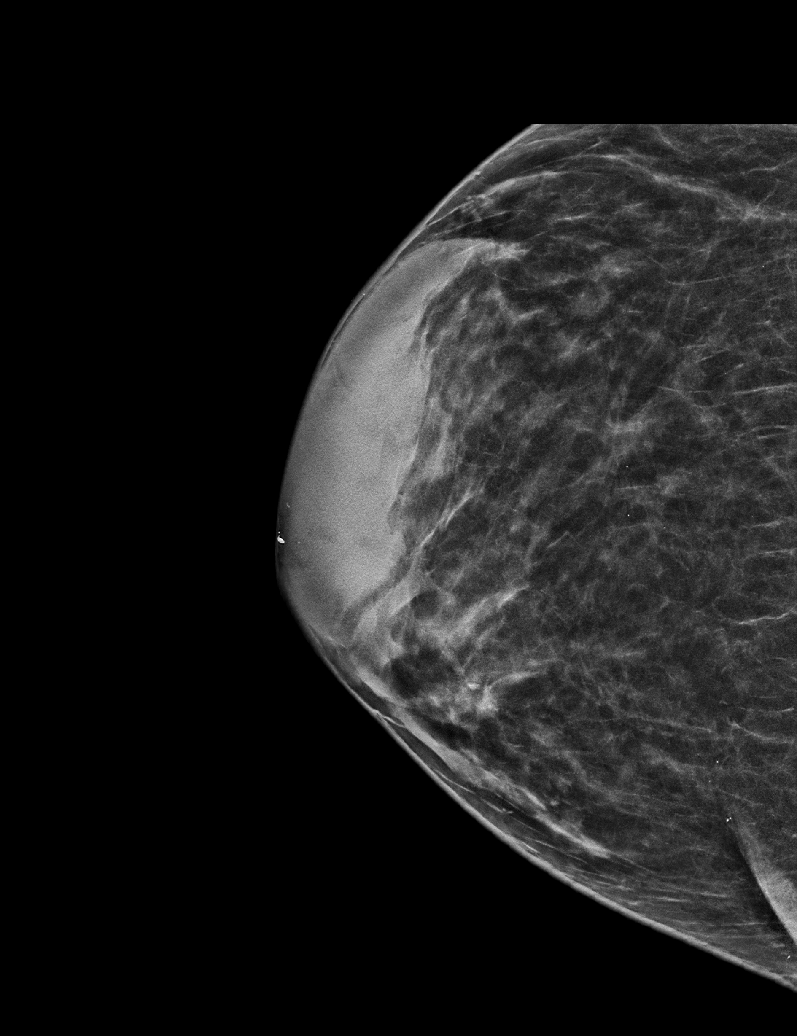

[L CC synth-2D (2 of 2)]
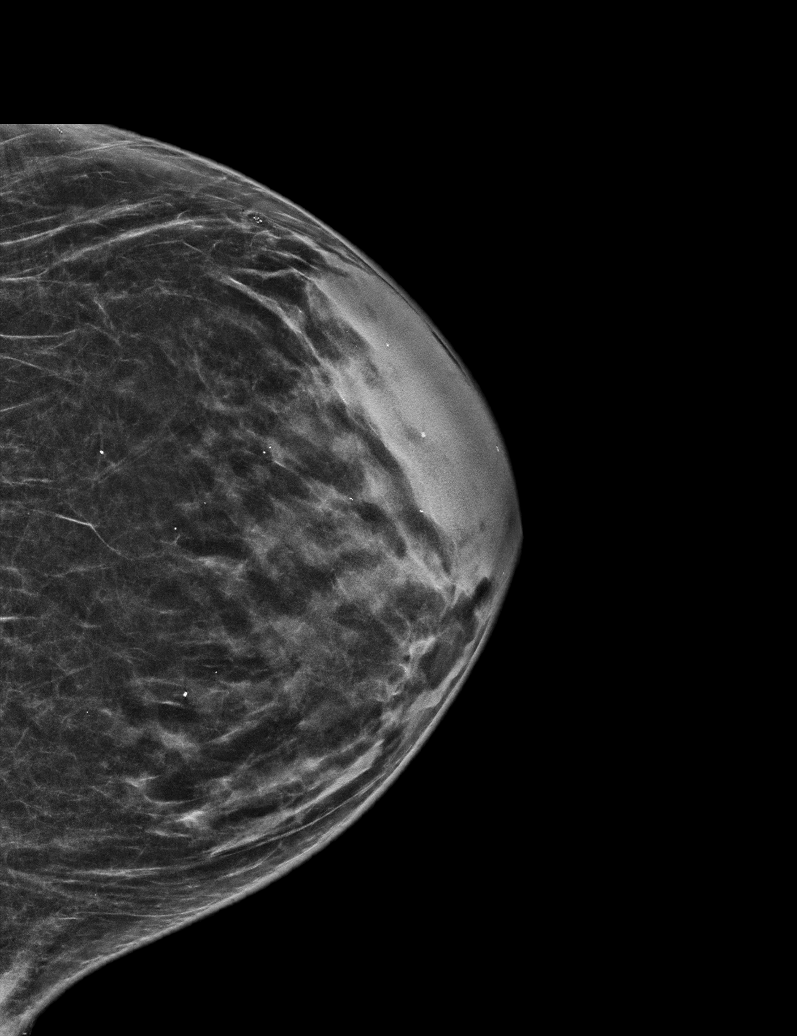

[L CC tomo · tomo slice 28/55.0]
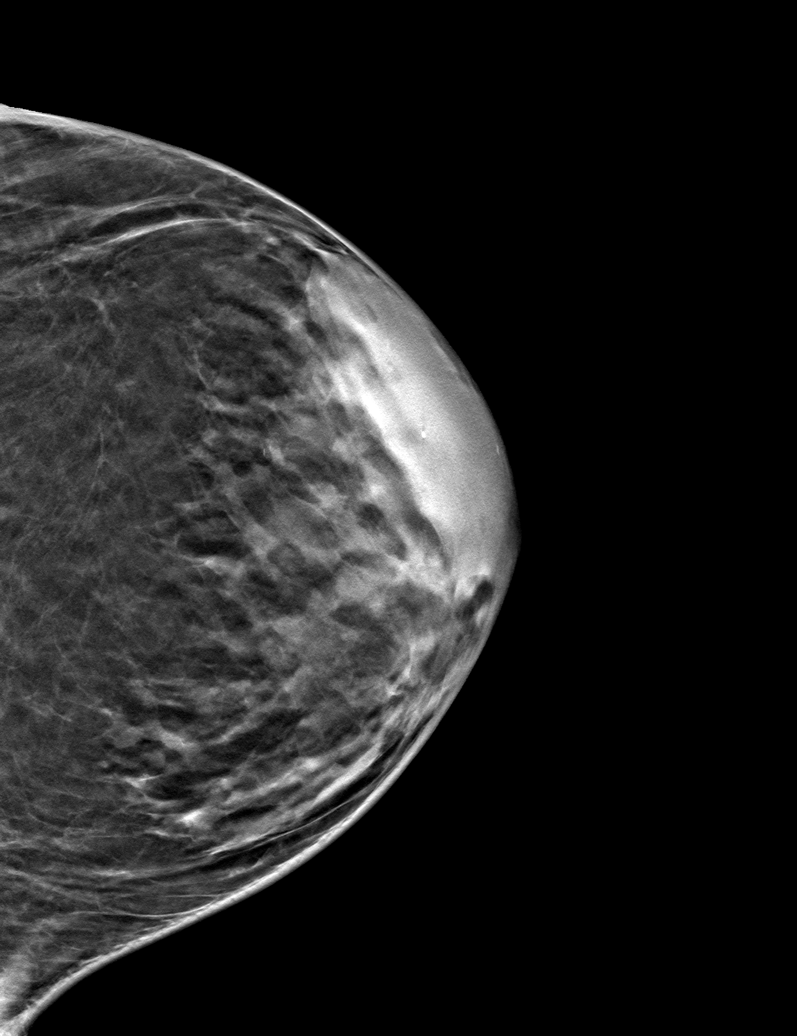

[6 of 30 positions shown; findings below may reference images not displayed]

ACR Breast Density Category b: There are scattered areas of
fibroglandular density.
FINDINGS: There are no findings suspicious for malignancy. Images were
processed with CAD.
IMPRESSION: No mammographic evidence of malignancy. A result letter of this
screening mammogram will be mailed directly to the patient.

RECOMMENDATION:
Screening mammogram in one year. (Code:[id number])

BI-RADS CATEGORY  1: Negative.

## 2020-12-30 ENCOUNTER — Telehealth: Payer: Self-pay

## 2020-12-30 NOTE — Telephone Encounter (Signed)
RTC to patient, spoke with her niece, Carlynn Purl, informed of Dr. Jacqualin Combes instructions below and she is in agreement.  Telehealth appt made for tomorrow and 1015 with Dr. Einar Crow.  Handicap placecard application filled out and placed in Dr. Jacqualin Combes box for signature. Thank you, SChaplin, RN,BSN

## 2020-12-30 NOTE — Telephone Encounter (Signed)
I think these two issues could be better addressed with a telephone visit with [facility] this week. We have been back and forth on the gout issue, and I want to be clear if we are going to start a new medicine. I can fill out the handicap form anytime.

## 2020-12-30 NOTE — Telephone Encounter (Signed)
Need refill on Gout medicine; pt contact [phone]   [id number] - [place], [place] - [address]

## 2020-12-30 NOTE — Telephone Encounter (Signed)
RTC, spoke w/ patient's niece who states patient started c/o of joint pain in her right foot and right knee on Friday and that it feels like a gout flare starting.  Denies any swelling.  Please see telephone note from 11/05/20 regarding allopurinol.  Niece also wants to know if MD will fill out application for handicap place-card from [facility].  Thank you, SChaplin, RN,BSN

## 2020-12-31 ENCOUNTER — Other Ambulatory Visit: Payer: Self-pay

## 2020-12-31 ENCOUNTER — Ambulatory Visit (INDEPENDENT_AMBULATORY_CARE_PROVIDER_SITE_OTHER): Payer: MEDICARE | Admitting: Student

## 2020-12-31 DIAGNOSIS — M1A0490 Idiopathic chronic gout, unspecified hand, without tophus (tophi): Secondary | ICD-10-CM

## 2020-12-31 MED ORDER — ALLOPURINOL 100 MG PO TABS: ORAL_TABLET | ORAL | 9 refills | Status: DC

## 2020-12-31 MED ORDER — COLCHICINE 0.6 MG PO TABS: 0.6000 mg | ORAL_TABLET | Freq: Every day | ORAL | 0 refills | Status: DC | PRN

## 2020-12-31 NOTE — Assessment & Plan Note (Addendum)
Assessment: Patient's sister endorses patient c/o bilateral knee pain, consistent with prior gout flares. Patient has been off of allopurinol and colchicine for the past few months. The patient was instructed to stop taking her colchicine, however, there appeared to some confusion at that time and the patient's care taker stopped administering both allopurinol as well as colchicine. The patient was without gout flares until recently when she began having knee pain. She did endorse foot pain as well, but it has since resolved. Once the patient began complaining of pain, the patient's caretaker, Ms Maple Hudson, started giving her her colchicine. She has been out of allopurinol and not restarted this as of yet.   Will restart patient on previous plan, but will have her start the allopurinol after having resolution of her gout flair. Recommended she continue colchicine until resolution of her flair.   Plan: - continue colchicine .6 mg daily until resolution of gout flair - restart allopurinol 100 mg daily after resolution of gout flair

## 2020-12-31 NOTE — Progress Notes (Signed)
   CC: bilateral knee pain  This is a telephone encounter between Linda Lynn and Linda Lynn on 12/31/2020 for bilateral knee pain. The visit was conducted with the patient located at home and Linda Lynn at [facility]. The patient's identity was confirmed using their DOB and current address. The patient has consented to being evaluated through a telephone encounter and understands the associated risks (an examination cannot be done and the patient may need to come in for an appointment) / benefits (allows the patient to remain at home, decreasing exposure to coronavirus). I personally spent 5 minutes on medical discussion.   HPI:  [name] is a 60 y.o. with PMH as below.   Please see A&P for assessment of the patient's acute and chronic medical conditions.   Past Medical History:  Diagnosis Date  . Diabetes mellitus   . Gout   . Hyperlipidemia   . Hypertension   . 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       Bilateral knee pain  All other systems reviewed and are negative.  Assessment & Plan:   See Encounters Tab for problem based charting.  Patient discussed with Dr. Girtha Rm Internal Medicine Resident

## 2021-01-02 ENCOUNTER — Other Ambulatory Visit: Payer: Self-pay | Admitting: Student

## 2021-01-08 NOTE — Progress Notes (Signed)
Internal Medicine Clinic Attending  Case discussed with Dr. Katsadouros  At the time of the visit.  We reviewed the resident's history and pertinent patient test results.  I agree with the assessment, diagnosis, and plan of care documented in the resident's note.  

## 2021-01-31 ENCOUNTER — Telehealth: Payer: Self-pay

## 2021-01-31 NOTE — Telephone Encounter (Signed)
Pls contact pt [phone]

## 2021-01-31 NOTE — Telephone Encounter (Signed)
RTC, spoke with [name], who states they had a missed call from this number, but no message.  No telephone notes noted. SChaplin, RN,BSN

## 2021-04-20 ENCOUNTER — Other Ambulatory Visit: Payer: Self-pay | Admitting: Student in an Organized Health Care Education/Training Program

## 2021-05-08 ENCOUNTER — Other Ambulatory Visit: Payer: Self-pay | Admitting: Student in an Organized Health Care Education/Training Program

## 2021-06-16 ENCOUNTER — Other Ambulatory Visit: Payer: Self-pay

## 2021-06-16 ENCOUNTER — Ambulatory Visit (INDEPENDENT_AMBULATORY_CARE_PROVIDER_SITE_OTHER): Payer: Medicare HMO | Admitting: Student

## 2021-06-16 ENCOUNTER — Encounter: Payer: Self-pay | Admitting: Student

## 2021-06-16 VITALS — BP 139/74 | HR 81 | Temp 97.6°F | Ht <= 58 in | Wt 137.3 lb

## 2021-06-16 DIAGNOSIS — Z0000 Encounter for general adult medical examination without abnormal findings: Secondary | ICD-10-CM | POA: Diagnosis not present

## 2021-06-16 DIAGNOSIS — I10 Essential (primary) hypertension: Secondary | ICD-10-CM

## 2021-06-16 DIAGNOSIS — Z23 Encounter for immunization: Secondary | ICD-10-CM | POA: Diagnosis not present

## 2021-06-16 DIAGNOSIS — M1000 Idiopathic gout, unspecified site: Secondary | ICD-10-CM | POA: Diagnosis not present

## 2021-06-16 DIAGNOSIS — E785 Hyperlipidemia, unspecified: Secondary | ICD-10-CM | POA: Diagnosis not present

## 2021-06-16 DIAGNOSIS — Z7984 Long term (current) use of oral hypoglycemic drugs: Secondary | ICD-10-CM

## 2021-06-16 DIAGNOSIS — E1169 Type 2 diabetes mellitus with other specified complication: Secondary | ICD-10-CM | POA: Diagnosis not present

## 2021-06-16 NOTE — Assessment & Plan Note (Signed)
Last A1c of 6.  Currently taking metformin 500 mg twice daily.  Foot exam normal.  She has impaired vision.  -A1c today -Continue metformin for 100 mg twice daily

## 2021-06-16 NOTE — Assessment & Plan Note (Signed)
Tdap vaccine given today.  

## 2021-06-16 NOTE — Patient Instructions (Signed)
Linda Lynn,   It was a pleasure seeing you in the clinic today.  Here is a summary of what we talked about:  1.  I will order A1c, lipid panel, CMP and CBC today.  2.  High blood pressure: Your blood pressure slightly elevated today at 139/74.  I would not make any change to your medication.  We will repeat your blood pressure at next visit.  3.  Type 2 diabetes: We will check your A1c today.  Please continue metformin.  4.  Gout: Please continue taking allopurinol daily.  Use colchicine only for gout flare.  Please return to clinic in 6 months,  Take care  Dr. Cyndie Chime

## 2021-06-16 NOTE — Assessment & Plan Note (Addendum)
Blood pressure slightly elevated today at 139/74.  She is currently taking amlodipine-valsartan 5-160 mg.  Her HCTZ was held last visit due to hypotension.  I will not make any changes today.  We will repeat her blood pressure at next visit.  If continue to be elevated, can adjust the dose of her medication. -Pending CMP and CBC

## 2021-06-16 NOTE — Progress Notes (Signed)
   CC: Routine follow-up, blood work for insurance  HPI:  [name] is a 60 y.o. 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  Her new insurance plan requires A1c, lipid panel, CMP and CBC  Please see problem based charting for details.  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    Appearance: She is not toxic-appearing.  HENT:     Head: Normocephalic.  Eyes:     Comments: Impaired vision  Cardiovascular:     Rate and Rhythm: Normal rate and regular rhythm.     Heart sounds: Normal heart sounds.     Comments: No LE edema Pulmonary:     Effort: Pulmonary effort is normal. No respiratory distress.     Breath sounds: No wheezing.  Musculoskeletal:        General: Normal range of motion.     Cervical back: Normal range of motion.     Comments: No wound or ulcer of bilateral feet.  Normal pedis pulses palpated  Skin:    General: Skin is warm.     Coloration: Skin is not jaundiced.  Neurological:     Mental Status: She is alert.  Psychiatric:        Mood and Affect: Mood normal.     Assessment & Plan:   See Encounters Tab for problem based charting.  Patient discussed with Dr. 

## 2021-06-16 NOTE — Assessment & Plan Note (Signed)
Continue allopurinol.  Colchicine as needed for gout flare

## 2021-06-17 LAB — CMP14 + ANION GAP
ALT: 12 IU/L (ref 0–32)
AST: 20 IU/L (ref 0–40)
Albumin/Globulin Ratio: 1.1 — ABNORMAL LOW (ref 1.2–2.2)
Albumin: 3.9 g/dL (ref 3.8–4.9)
Alkaline Phosphatase: 73 IU/L (ref 44–121)
Anion Gap: 13 mmol/L (ref 10.0–18.0)
BUN/Creatinine Ratio: 26 (ref 12–28)
BUN: 29 mg/dL — ABNORMAL HIGH (ref 8–27)
Bilirubin Total: 0.3 mg/dL (ref 0.0–1.2)
CO2: 27 mmol/L (ref 20–29)
Calcium: 9.2 mg/dL (ref 8.7–10.3)
Chloride: 101 mmol/L (ref 96–106)
Creatinine, Ser: 1.13 mg/dL — ABNORMAL HIGH (ref 0.57–1.00)
Globulin, Total: 3.7 g/dL (ref 1.5–4.5)
Glucose: 123 mg/dL — ABNORMAL HIGH (ref 65–99)
Potassium: 4.7 mmol/L (ref 3.5–5.2)
Sodium: 141 mmol/L (ref 134–144)
Total Protein: 7.6 g/dL (ref 6.0–8.5)
eGFR: 56 mL/min/{1.73_m2} — ABNORMAL LOW (ref >59)

## 2021-06-17 LAB — LIPID PANEL
Chol/HDL Ratio: 2.5 ratio (ref 0.0–4.4)
Cholesterol, Total: 174 mg/dL (ref 100–199)
HDL: 69 mg/dL (ref >39)
LDL Chol Calc (NIH): 89 mg/dL (ref 0–99)
Triglycerides: 88 mg/dL (ref 0–149)
VLDL Cholesterol Cal: 16 mg/dL (ref 5–40)

## 2021-06-17 LAB — CBC
Hematocrit: 36.4 % (ref 34.0–46.6)
Hemoglobin: 12.1 g/dL (ref 11.1–15.9)
MCH: 29 pg (ref 26.6–33.0)
MCHC: 33.2 g/dL (ref 31.5–35.7)
MCV: 87 fL (ref 79–97)
Platelets: 292 10*3/uL (ref 150–450)
RBC: 4.17 x10E6/uL (ref 3.77–5.28)
RDW: 14.3 % (ref 11.7–15.4)
WBC: 7.9 10*3/uL (ref 3.4–10.8)

## 2021-06-17 LAB — HEMOGLOBIN A1C
Est. average glucose Bld gHb Est-mCnc: 137 mg/dL
Hgb A1c MFr Bld: 6.4 % — ABNORMAL HIGH (ref 4.8–5.6)

## 2021-06-18 NOTE — Assessment & Plan Note (Addendum)
She is on pravastatin for primary prevention.  -Repeat lipid panel

## 2021-06-20 ENCOUNTER — Other Ambulatory Visit: Payer: Self-pay | Admitting: Student in an Organized Health Care Education/Training Program

## 2021-06-20 DIAGNOSIS — Z1231 Encounter for screening mammogram for malignant neoplasm of breast: Secondary | ICD-10-CM

## 2021-06-20 NOTE — Progress Notes (Signed)
Internal Medicine Clinic Attending  Case discussed with Dr. Nguyen  At the time of the visit.  We reviewed the resident's history and exam and pertinent patient test results.  I agree with the assessment, diagnosis, and plan of care documented in the resident's note. 

## 2021-07-05 ENCOUNTER — Other Ambulatory Visit: Payer: Self-pay | Admitting: Student in an Organized Health Care Education/Training Program

## 2021-07-05 DIAGNOSIS — E119 Type 2 diabetes mellitus without complications: Secondary | ICD-10-CM

## 2021-07-08 ENCOUNTER — Ambulatory Visit: Payer: MEDICARE | Admitting: Podiatrist

## 2021-07-31 ENCOUNTER — Other Ambulatory Visit: Payer: Self-pay

## 2021-07-31 ENCOUNTER — Encounter: Payer: Self-pay | Admitting: Podiatrist

## 2021-07-31 ENCOUNTER — Ambulatory Visit (INDEPENDENT_AMBULATORY_CARE_PROVIDER_SITE_OTHER): Payer: Medicare HMO | Admitting: Podiatrist

## 2021-07-31 DIAGNOSIS — E0843 Diabetes mellitus due to underlying condition with diabetic autonomic (poly)neuropathy: Secondary | ICD-10-CM

## 2021-07-31 DIAGNOSIS — B351 Tinea unguium: Secondary | ICD-10-CM

## 2021-07-31 DIAGNOSIS — L84 Corns and callosities: Secondary | ICD-10-CM | POA: Diagnosis not present

## 2021-07-31 NOTE — Patient Instructions (Signed)

## 2021-07-31 NOTE — Progress Notes (Signed)
Chief Complaint  Patient presents with   Nail Problem    Diabetic foot care nail trim      HPI: Patient is 60 y.o. female who presents today for the concerns as listed above.  She presents today with her family member who also helps care for her.  Linda Lynn is diabetic but relates she does not take her blood sugar regularly as it is well controlled. Her last visit with Dr. Oswaldo Done was 06/16/2021.  She does have gout from time to time.. not currently an issue today.   Patient Active Problem List   Diagnosis Date Noted   Bradycardia 01/10/2018   Blindness 04/06/2016   Hyperlipidemia associated with type 2 diabetes mellitus (HCC) 04/20/2012   Healthcare maintenance 08/06/2011   Gout 08/06/2010   Type 2 diabetes mellitus with other specified complication (HCC) 09/25/2006   Essential hypertension 09/25/2006   Down's syndrome 09/25/2006    Current Outpatient Medications on File Prior to Visit  Medication Sig Dispense Refill   allopurinol (ZYLOPRIM) 100 MG tablet TAKE 1 TABLET BY MOUTH EVERY DAY ** DO NOT START MEDICATION UNTIL CURRENT GOUT FLARE IS RESOLVED 30 tablet 9   amLODipine-valsartan (EXFORGE) 5-160 MG tablet TAKE 1 TABLET BY MOUTH EVERY DAY 90 tablet 3   colchicine 0.6 MG tablet TAKE 1 TABLET (0.6 MG TOTAL) BY MOUTH DAILY AS NEEDED (UNTIL GOUT FLAIR RESOLVES). 90 tablet 1   fluticasone (FLONASE) 50 MCG/ACT nasal spray Place 1 spray into both nostrils daily. 16 g 2   latanoprost (XALATAN) 0.005 % ophthalmic solution      metFORMIN (GLUCOPHAGE) 500 MG tablet TAKE 1 TABLET (500 MG TOTAL) BY MOUTH 2 (TWO) TIMES DAILY WITH A MEAL. 180 tablet 1   pravastatin (PRAVACHOL) 20 MG tablet TAKE 1 TABLET BY MOUTH EVERY DAY 90 tablet 1   No current facility-administered medications on file prior to visit.    No Known Allergies  Review of Systems No fevers, chills, nausea, muscle aches, no difficulty breathing, no calf pain, no chest pain or shortness of breath.   Physical Exam  GENERAL  APPEARANCE: Alert, conversant. Appropriately groomed. No acute distress.   VASCULAR: Pedal pulses palpable DP and PT bilateral.  Capillary refill time is immediate to all digits,  Proximal to distal cooling it warm to warm.  Digital perfusion adequate.   NEUROLOGIC: sensation is decreased o 5.07 monofilament at 2/5 sites bilateral.  Light touch is intact bilateral, vibratory sensation intact bilateral  MUSCULOSKELETAL: acceptable muscle strength, tone and stability bilateral.  No gross boney pedal deformities noted.  No pain, crepitus or limitation noted with foot and ankle range of motion bilateral.   DERMATOLOGIC: skin is warm, supple, and dry.  No open lesions noted.  No rash, no pre ulcerative lesions. Digital nails are thick, discolored, dystrophic, brittle with subungual debris present and clinically mycotic x 10.    Well circumscribed circular heel callus is noted bilateral.  Does not appear to be painful.  No swelling noted no redness seen.   Assessment   1. 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 Dermatophytosis of nail   3. Heel callus      Plan  Gust exam findings with the patient and her caregiver.  Recommended debriding the nails which was accomplished today via nail nipper and power bur.  Recommended watching the heel calluses as they do appear stable.  Her caregiver does an excellent job of trimming her nails  and therefore I recommended she be seen back 6 months to a year for follow-up and if any questions problems or concerns arise prior to that visit she is instructed to call.

## 2021-08-11 ENCOUNTER — Other Ambulatory Visit: Payer: Self-pay

## 2021-08-11 ENCOUNTER — Ambulatory Visit
Admission: RE | Admit: 2021-08-11 | Discharge: 2021-08-11 | Disposition: A | Discharge status: Home / Self Care | Payer: Medicare HMO | Source: Ambulatory Visit | Attending: Student in an Organized Health Care Education/Training Program | Admitting: Student in an Organized Health Care Education/Training Program

## 2021-08-11 DIAGNOSIS — Z1231 Encounter for screening mammogram for malignant neoplasm of breast: Secondary | ICD-10-CM | POA: Diagnosis not present

## 2021-09-09 ENCOUNTER — Ambulatory Visit (INDEPENDENT_AMBULATORY_CARE_PROVIDER_SITE_OTHER): Payer: Medicare HMO | Admitting: Internal Medicine

## 2021-09-09 ENCOUNTER — Encounter: Payer: Self-pay | Admitting: Internal Medicine

## 2021-09-09 ENCOUNTER — Other Ambulatory Visit: Payer: Self-pay

## 2021-09-09 DIAGNOSIS — L989 Disorder of the skin and subcutaneous tissue, unspecified: Secondary | ICD-10-CM | POA: Diagnosis not present

## 2021-09-09 NOTE — Patient Instructions (Addendum)
Ms Linda Lynn,  It was a pleasure seeing you in clinic. Today we discussed:   Rash in Groin: At this time, please keep the groin area clean, dry and covered. You may use either Vaseline gel or Gerhardt's cream to protect from moisture.   If you have any questions or concerns, please call our clinic at [phone] between 9am-5pm and after hours call [phone] and ask for the internal medicine resident on call. If you feel you are having a medical emergency please call 911.   Thank you, we look forward to helping you remain healthy!

## 2021-09-09 NOTE — Assessment & Plan Note (Signed)
Linda Lynn is presenting for evaluation of bilateral groin lesions.  Patient is accompanied by her grand-niece who reports seeing these lesions approximately 2 days prior when she was attempting to bathe the patient.  She noted to have blood stains on the edges of her pull ups.  Ms. Loughmiller grandniece noted that her groin area appeared to be raw and cleaned it with hydrogen peroxide and applied Neosporin cream and derma plan.  On examination, patient noted to have skin lesion measuring approximately 3 to 4 cm in the left groin with no fold.  No active bleeding or drainage noted.  Appears to be macerated at this time.  Also has similar but more shallow and smaller lesion in the right groin approximately 1 to 2 cm in length at the inguinal folds.  Does not appear to be infected at this time.  The left groin lesion was cleaned with normal saline and dressed during office visit.At this time, patient's grandniece advised to keep the area clean, dry with using Vaseline as needed for moisture barrier. Patient's grandniece will continue to monitor for any worsening of the lesion or any drainage.

## 2021-09-09 NOTE — Progress Notes (Signed)
Acute Office Visit  Subjective:    Patient ID: Linda Lynn, female    DOB: 1961-08-26, 60 y.o.   MRN: [id number]  Chief Complaint  Patient presents with   Rash    Groin x 2 days    HPI Patient is a 60 year old female with PMHx as stated below presenting today for rash that was noticed approximately two days ago. Patient is accompanied by her niece who reports that patient usually wears pull ups and two days prior, she noted some blood on the edges of the pull ups when the patient was getting into the bath. On examination, she said that the skin looked raw. She tried using hydrogen peroxide, neosporin and barrier cream on the skin. Please see problem based charting for complete assessment and plan.  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    Past Surgical History:  Procedure Laterality Date   EYE SURGERY  2007   right    Family History  Problem Relation Age of Onset   Diabetes Sister    Hypertension Sister    Diabetes Sister    Hypertension Sister    Colon cancer Cousin     Social History   Socioeconomic History   Marital status: Single    Spouse name: Not on file   Number of children: Not on file   Years of education: Not on file   Highest education level: Not on file  Occupational History   Not on file  Tobacco Use   Smoking status: Never   Smokeless tobacco: Never  Substance and Sexual Activity   Alcohol use: No    Alcohol/week: 0.0 standard drinks   Drug use: No   Sexual activity: Not on file  Other Topics Concern   Not on file  Social History Narrative   Not on file   Social Determinants of Health   Financial Resource Strain: Not on file  Food Insecurity: Not on file  Transportation Needs: Not on file  Physical Activity: Not on file  Stress: Not on file  Social Connections: Not on file  Intimate  Partner Violence: Not on file    Outpatient Medications Prior to Visit  Medication Sig Dispense Refill   allopurinol (ZYLOPRIM) 100 MG tablet TAKE 1 TABLET BY MOUTH EVERY DAY ** DO NOT START MEDICATION UNTIL CURRENT GOUT FLARE IS RESOLVED 30 tablet 9   amLODipine-valsartan (EXFORGE) 5-160 MG tablet TAKE 1 TABLET BY MOUTH EVERY DAY 90 tablet 3   colchicine 0.6 MG tablet TAKE 1 TABLET (0.6 MG TOTAL) BY MOUTH DAILY AS NEEDED (UNTIL GOUT FLAIR RESOLVES). 90 tablet 1   fluticasone (FLONASE) 50 MCG/ACT nasal spray Place 1 spray into both nostrils daily. 16 g 2   latanoprost (XALATAN) 0.005 % ophthalmic solution      metFORMIN (GLUCOPHAGE) 500 MG tablet TAKE 1 TABLET (500 MG TOTAL) BY MOUTH 2 (TWO) TIMES DAILY WITH A MEAL. 180 tablet 1   pravastatin (PRAVACHOL) 20 MG tablet TAKE 1 TABLET BY MOUTH EVERY DAY 90 tablet 1   No facility-administered medications prior to visit.    No Known Allergies  Review of Symptoms: Negative except as stated in HPI     Objective:     BP 108/71 (BP Location: Left Arm, Patient Position: Sitting, Cuff Size: Normal)  Pulse 78   Temp 98 F (36.7 C) (Oral)   Wt 134 lb 9.6 oz (61.1 kg)   LMP 03/02/2008   SpO2 100%   BMI 30.18 kg/m  Wt Readings from Last 3 Encounters:  09/09/21 134 lb 9.6 oz (61.1 kg)  06/16/21 137 lb 4.8 oz (62.3 kg)  11/04/20 128 lb 4.8 oz (58.2 kg)   Physical Exam  Constitutional: Chronically ill appearing elderly female, no acute distress Cardiovascular: RRR, S1 and S2 present, no m/r/g. Distal pulses intact Respiratory: Effort normal on room air. No respiratory distress Musculoskeletal: Normal bulk and tone.  No peripheral edema noted. Skin: Warm and dry.   R groin with macerated skin lesion ~3-4 cm in length with underlying subcutaneous tissue appreciated; no active bleeding or signs of infection or drainage.  Psychiatric: Normal mood and affect. Behavior is normal. Judgment and thought content normal.      Assessment & Plan:    Problem List Items Addressed This Visit       Musculoskeletal and Integument   Skin lesion    Ms. Linda Lynn is presenting for evaluation of bilateral groin lesions.  Patient is accompanied by her grand-niece who reports seeing these lesions approximately 2 days prior when she was attempting to bathe the patient.  She noted to have blood stains on the edges of her pull ups.  Ms. Jupin grandniece noted that her groin area appeared to be raw and cleaned it with hydrogen peroxide and applied Neosporin cream and derma plan.  On examination, patient noted to have skin lesion measuring approximately 3 to 4 cm in the left groin with no fold.  No active bleeding or drainage noted.  Appears to be macerated at this time.  Also has similar but more shallow and smaller lesion in the right groin approximately 1 to 2 cm in length at the inguinal folds.  Does not appear to be infected at this time.  The left groin lesion was cleaned with normal saline and dressed during office visit.At this time, patient's grandniece advised to keep the area clean, dry with using Vaseline as needed for moisture barrier. Patient's grandniece will continue to monitor for any worsening of the lesion or any drainage.        No orders of the defined types were placed in this encounter.    Eliezer Bottom, MD

## 2021-09-10 NOTE — Addendum Note (Signed)
Addended by: Erlinda Hong T on: 09/10/2021 10:55 AM   Modules accepted: Level of Service

## 2021-09-10 NOTE — Progress Notes (Signed)
Internal Medicine Clinic Attending ° °Case discussed with Dr. Aslam  At the time of the visit.  We reviewed the resident’s history and exam and pertinent patient test results.  I agree with the assessment, diagnosis, and plan of care documented in the resident’s note.  °

## 2021-09-23 NOTE — Progress Notes (Signed)
Follow-up for rash seen 10/25 Bilateral groin lesions, maceration L>R, cleaned with NS in clinic and dressed.   Pre Diabetic Lab Results  Component Value Date   HGBA1C 6.4 (H) 06/16/2021     -->recheck a1c

## 2021-09-24 ENCOUNTER — Ambulatory Visit (INDEPENDENT_AMBULATORY_CARE_PROVIDER_SITE_OTHER): Payer: Medicare HMO | Admitting: Student

## 2021-09-24 VITALS — BP 125/68 | HR 70 | Temp 98.4°F | Wt 136.5 lb

## 2021-09-24 DIAGNOSIS — Z23 Encounter for immunization: Secondary | ICD-10-CM | POA: Diagnosis not present

## 2021-09-24 DIAGNOSIS — E1169 Type 2 diabetes mellitus with other specified complication: Secondary | ICD-10-CM | POA: Diagnosis not present

## 2021-09-24 DIAGNOSIS — L989 Disorder of the skin and subcutaneous tissue, unspecified: Secondary | ICD-10-CM

## 2021-09-24 DIAGNOSIS — Z0000 Encounter for general adult medical examination without abnormal findings: Secondary | ICD-10-CM

## 2021-09-24 LAB — POCT GLYCOSYLATED HEMOGLOBIN (HGB A1C): Hemoglobin A1C: 5.7 % — AB (ref 4.0–5.6)

## 2021-09-24 LAB — GLUCOSE, CAPILLARY: Glucose-Capillary: 110 mg/dL — ABNORMAL HIGH (ref 70–99)

## 2021-09-24 NOTE — Patient Instructions (Addendum)
It was a pleasure seeing you in clinic. Today we discussed:   Please continue to keep the groin area clean and try. You can try using a barrier cream such as Desitin or Gerhardt cream to help keep the area dry. Please follow up if the wounds are worsening or appear red, bleeding, or there is discharge, or if Linda Lynn develops fever, chills, or signs of infection.   If you have any questions or concerns, please call our clinic at [phone] between 9am-5pm and after hours call [phone] and ask for the internal medicine resident on call. If you feel you are having a medical emergency please call 911.   Thank you, we look forward to helping you remain healthy!

## 2021-09-24 NOTE — Progress Notes (Signed)
   CC: bilateral groin rash  HPI:  Linda Lynn is a 60 y.o. F with history listed below presents for follow up of bilateral skin breakdown of the groin. Patient seen with niece who is her caretaker. Please refer to problem based charting for further details and assessment and plan of current problem and chronic medical conditions.   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  Constitutional:      Comments: Chronically ill appearing, obese, sitting comfortably, in acute distress  HENT:     Head: Normocephalic and atraumatic.     Right Ear: External ear normal.     Left Ear: External ear normal.     Mouth/Throat:     Mouth: Mucous membranes are moist.     Pharynx: Oropharynx is clear.  Cardiovascular:     Rate and Rhythm: Normal rate and regular rhythm.     Pulses: Normal pulses.  Pulmonary:     Effort: Pulmonary effort is normal.     Breath sounds: Normal breath sounds. No wheezing or rhonchi.  Abdominal:     General: Abdomen is flat. Bowel sounds are normal. There is no distension.     Palpations: Abdomen is soft.     Tenderness: There is no abdominal tenderness.  Musculoskeletal:        General: Normal range of motion.     Cervical back: Normal range of motion and neck supple.     Right lower leg: No edema.     Left lower leg: No edema.  Skin:    General: Skin is warm.     Capillary Refill: Capillary refill takes less than 2 seconds.     Comments: Macerated areas bilateral groin, shallow 2cm area of maceration on the right, 3 cm area of maceration on the left with 0.5 cm area of deeper skin  breakdown to the subcutaneous tissue. No bleeding, no erythema, no induration, or drainage. See photos below  Neurological:     General: No focal deficit present.     Mental Status: She is alert and oriented to person, place, and time.    Assessment & Plan:   See Encounters Tab for problem based charting.  Patient discussed with Dr. Oswaldo Done

## 2021-09-25 NOTE — Assessment & Plan Note (Signed)
Influenza vaccine administered

## 2021-09-25 NOTE — Progress Notes (Signed)
Internal Medicine Clinic Attending ? ?Case discussed with Dr. Liang  At the time of the visit.  We reviewed the resident?s history and exam and pertinent patient test results.  I agree with the assessment, diagnosis, and plan of care documented in the resident?s note. ? ?

## 2021-09-25 NOTE — Assessment & Plan Note (Signed)
A1c 5.7 today. Diabetes under control. Continue with current metformin 500 mg twice daily

## 2021-09-25 NOTE — Assessment & Plan Note (Signed)
Ms. Jaye Saal presents for follow up of bilateral groin wound secondary to mechanical friction and maceration. Patient accompanied by her niece who is her caretaker, who states wounds have been slowly improving. She has not observed further bleeding, redness, warmth, drainage, or swelling. She has been cleansing the area with saline, applying neosporin and vaseline, and attempting to keep the area dry, by avoiding using Depends, but note the area is difficult to keep dry due to location. Niece was unable to find barrier cream and has been using Vaseline.   On exam patient with shallow skin lesion measuring 3 cm in the right groin with small deeper area extending to the subcutaneous tissue. Also has very shallow dermal maceration on the right side about 2 cm in size. Does not appear to be infected. Overall left sided lesion seems improved since last visit.  Discussed continuing to keep the area clean and dry. Suggest she can try to use Desitin or other zinc based cream to help keep the area dry. Niece will continue to monitor for worsening skin breakdown and infection and follow up as needed

## 2021-09-26 NOTE — Progress Notes (Signed)
Internal Medicine Clinic Attending ? ?Case discussed with Dr. Liang  At the time of the visit.  We reviewed the resident?s history and exam and pertinent patient test results.  I agree with the assessment, diagnosis, and plan of care documented in the resident?s note. ? ?

## 2021-09-29 ENCOUNTER — Encounter: Payer: Self-pay | Admitting: Internal Medicine

## 2021-09-29 ENCOUNTER — Ambulatory Visit (INDEPENDENT_AMBULATORY_CARE_PROVIDER_SITE_OTHER): Payer: Medicare HMO | Admitting: Internal Medicine

## 2021-09-29 ENCOUNTER — Telehealth: Payer: Self-pay | Admitting: *Deleted

## 2021-09-29 ENCOUNTER — Other Ambulatory Visit: Payer: Self-pay

## 2021-09-29 VITALS — BP 129/77 | HR 92 | Temp 100.4°F | Wt 136.6 lb

## 2021-09-29 DIAGNOSIS — B338 Other specified viral diseases: Secondary | ICD-10-CM | POA: Diagnosis not present

## 2021-09-29 DIAGNOSIS — R0989 Other specified symptoms and signs involving the circulatory and respiratory systems: Secondary | ICD-10-CM

## 2021-09-29 DIAGNOSIS — Z20828 Contact with and (suspected) exposure to other viral communicable diseases: Secondary | ICD-10-CM

## 2021-09-29 MED ORDER — GUAIFENESIN-CODEINE 100-10 MG/5ML PO SOLN: 5.0000 mL | Freq: Four times a day (QID) | ORAL | 0 refills | Status: AC | PRN

## 2021-09-29 NOTE — Progress Notes (Addendum)
   CC: Acute visit for cough and rhinorrhea x 4 days with sick contacts testing positive for RSV today.   HPI:  [name] is a 60 y.o. female with a PMHx stated below and presents today for stated above. Please see the Encounters tab for problem-based Assessment & Plan for additional details.   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 90 tablet 1   fluticasone (FLONASE) 50 MCG/ACT nasal spray Place 1 spray into both nostrils daily. 16 g 2   latanoprost (XALATAN) 0.005 % ophthalmic solution      metFORMIN (GLUCOPHAGE) 500 MG tablet TAKE 1 TABLET (500 MG TOTAL) BY MOUTH 2 (TWO) TIMES DAILY WITH A MEAL. 180 tablet 1   pravastatin (PRAVACHOL) 20 MG tablet TAKE 1 TABLET BY MOUTH EVERY DAY 90 tablet 1   No current facility-administered medications on file prior to visit.    Family History  Problem Relation Age of Onset   Diabetes Sister    Hypertension Sister    Diabetes Sister    Hypertension Sister    Colon cancer Cousin     Social History   Socioeconomic History   Marital status: Single    Spouse name: Not on file   Number of children: Not on file   Years of education: Not on file   Highest education level: Not on file  Occupational History   Not on file  Tobacco Use   Smoking status: Never   Smokeless tobacco: Never  Substance and Sexual Activity   Alcohol use: No    Alcohol/week: 0.0  standard drinks   Drug use: No   Sexual activity: Not on file  Other Topics Concern   Not on file  Social History Narrative   Not on file   Social Determinants of Health   Financial Resource Strain: Not on file  Food Insecurity: Not on file  Transportation Needs: Not on file  Physical Activity: Not on file  Stress: Not on file  Social Connections: Not on file  Intimate Partner Violence: Not on file    Review of Systems: ROS negative except for what is noted on the assessment and plan.  There were no vitals filed for this visit.   Physical Exam: Constitutional: alert, well-appearing, in NAD, A&O x 3 HENT: atraumatic, mucous membranes moist, no palpable lymphadenopathy  Eyes: conjunctiva non-erythematous Cardiovascular: RRR, no m/r/g, non-edematous bilateral LE Pulmonary/Chest: normal work of breathing on RA, LCTAB Abdominal: soft, non-tender to palpation, non-distended Skin: warm and dry  Psych: normal behavior, normal affect    Assessment & Plan:   See Encounters Tab for problem based charting.  Patient seen with Dr. Delrae Alfred, MD  Internal Medicine Resident, PGY-1 [facility]

## 2021-09-29 NOTE — Patient Instructions (Addendum)
Thank you, [name] for allowing [place] to provide your care today!  Today we discussed:  RSV: Please continue to stay hydrated. The lingering cough will take some time to get better, please continue to use medications for this as needed. I prescribed guaifenesen-codeine. Take as needed but be aware that this can make you drowsy so try taking before bedtime. Please give [place] a call if you have worsening  breathing or shortness of breath.   I will call you if any labs, tests, or imaging results require any further attention or action.   I have ordered the following labs for you:  Lab Orders         COVID-19, Flu A+B and RSV        Follow up in: please return on appt day you made during previous visit on 09/24/21   Should you have any questions or concerns please call the internal medicine clinic at [phone].     Carmel Sacramento, MD  Internal Medicine Resident, PGY-1 [facility]

## 2021-09-29 NOTE — Telephone Encounter (Signed)
Call from pt's caregiver - stated pt has been coughing and sneezing. No fever. Stated one of her children has RSV. Requesting pt to be seen; stated she's able to bring pt today. In person appt given today @ 1530 PM w/Dr Allena Katz.

## 2021-09-29 NOTE — Assessment & Plan Note (Addendum)
Presenting with complaints unremitting non-prodoctuve cough with Robitussin, rhinorrhea, and nasal congestion x 4 days. Did not take temp at home, but temp 100.3 today in clinic; vitals otherwise stable. Abrupt sxs onset. Reports minimal improvement with OTC medications. Sick contact includes care-giver's children who she has daily contact with; children have the same sxs for same duration, and tested positive for RSV today. Vaccinated against COVID and influenza. No complaints of SHOB. Maintains appropriate hydration. Constellation of sxs consistent with viral infection. No sputum production. On exam, LCTAB and pt in NAD. Pt is counseled to continue OTC medications for symptomatic improvement but will prescribe an agent for unremitting and bothersome cough.    - RT-PCR COVID and Flu swab done  - Continue OTC medications for sxs relief  - Guaifenesin-codeine prescribed for cough  - Oral hydration encouraged  - Instructed to call office if pt notices worsening of symptoms   Addendum: RSV postive, COVID and flu negative. Pt's niece updated on results and reinforced symptom and course management.

## 2021-09-30 ENCOUNTER — Telehealth: Payer: Self-pay | Admitting: Internal Medicine

## 2021-09-30 LAB — COVID-19, FLU A+B AND RSV
Influenza A, NAA: NOT DETECTED
Influenza B, NAA: NOT DETECTED
RSV, NAA: DETECTED — AB
SARS-CoV-2, NAA: NOT DETECTED

## 2021-10-06 NOTE — Progress Notes (Signed)
Internal Medicine Clinic Attending  I saw and evaluated the patient.  I personally confirmed the key portions of the history and exam documented by Dr. Patel and I reviewed pertinent patient test results.  The assessment, diagnosis, and plan were formulated together and I agree with the documentation in the resident's note.  

## 2021-11-03 ENCOUNTER — Other Ambulatory Visit: Payer: Self-pay | Admitting: Student in an Organized Health Care Education/Training Program

## 2021-12-07 ENCOUNTER — Other Ambulatory Visit: Payer: Self-pay | Admitting: Student

## 2021-12-31 DIAGNOSIS — E118 Type 2 diabetes mellitus with unspecified complications: Secondary | ICD-10-CM | POA: Diagnosis not present

## 2022-01-09 ENCOUNTER — Other Ambulatory Visit: Payer: Self-pay | Admitting: Student in an Organized Health Care Education/Training Program

## 2022-01-09 DIAGNOSIS — E119 Type 2 diabetes mellitus without complications: Secondary | ICD-10-CM

## 2022-02-18 DIAGNOSIS — E118 Type 2 diabetes mellitus with unspecified complications: Secondary | ICD-10-CM | POA: Diagnosis not present

## 2022-03-16 DIAGNOSIS — E118 Type 2 diabetes mellitus with unspecified complications: Secondary | ICD-10-CM | POA: Diagnosis not present

## 2022-03-19 ENCOUNTER — Telehealth: Payer: Self-pay | Admitting: *Deleted

## 2022-03-20 ENCOUNTER — Ambulatory Visit: Payer: Medicare Other | Admitting: *Deleted

## 2022-03-20 DIAGNOSIS — Z0000 Encounter for general adult medical examination without abnormal findings: Secondary | ICD-10-CM

## 2022-03-20 NOTE — Patient Instructions (Signed)

## 2022-03-20 NOTE — Progress Notes (Cosign Needed Addendum)
Subjective:   Linda Lynn is a 61 y.o. female who presents for an Initial Medicare Annual Wellness Visit.I connected with  Zabella Wease caregiver on 03/20/22 by a audio enabled telemedicine application and verified that I am speaking with the correct person using two identifiers.  Patient Location: Home  Provider Location: Office/Clinic  I discussed the limitations of evaluation and management by telemedicine. The patient's family expressed understanding and agreed to proceed.   Review of Systems    Defer to pcp       Objective:    There were no vitals filed for this visit. There is no height or weight on file to calculate BMI.     09/29/2021    4:42 PM 09/24/2021    3:40 PM 09/09/2021    5:04 PM 06/16/2021    3:18 PM 11/04/2020   11:14 AM 03/25/2020    1:12 PM 08/03/2019   11:25 AM  Advanced Directives  Does Patient Have a Medical Advance Directive? No No No Yes No No No  Type of Advance Directive    Healthcare Power of Attorney     Would patient like information on creating a medical advance directive? No - Patient declined No - Patient declined No - Guardian declined  No - Patient declined No - Patient declined No - Patient declined    Current Medications (verified) Outpatient Encounter Medications as of 03/20/2022  Medication Sig   allopurinol (ZYLOPRIM) 100 MG tablet TAKE 1 TABLET BY MOUTH EVERY DAY ** DO NOT START MEDICATION UNTIL CURRENT GOUT FLARE IS RESOLVED   amLODipine-valsartan (EXFORGE) 5-160 MG tablet TAKE 1 TABLET BY MOUTH EVERY DAY   colchicine 0.6 MG tablet TAKE 1 TABLET (0.6 MG TOTAL) BY MOUTH DAILY AS NEEDED (UNTIL GOUT FLAIR RESOLVES).   fluticasone (FLONASE) 50 MCG/ACT nasal spray Place 1 spray into both nostrils daily.   latanoprost (XALATAN) 0.005 % ophthalmic solution    metFORMIN (GLUCOPHAGE) 500 MG tablet TAKE 1 TABLET BY MOUTH 2 TIMES DAILY WITH A MEAL.   pravastatin (PRAVACHOL) 20 MG tablet TAKE 1 TABLET BY MOUTH EVERY DAY   No  facility-administered encounter medications on file as of 03/20/2022.    Allergies (verified) Patient has no known allergies.   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   Past Surgical History:  Procedure Laterality Date   EYE SURGERY  2007   right   Family History  Problem Relation Age of Onset   Diabetes Sister    Hypertension Sister    Diabetes Sister    Hypertension Sister    Colon cancer Cousin    Social History   Socioeconomic History   Marital status: Single    Spouse name: Not on file   Number of children: Not on file   Years of education: Not on file   Highest education level: Not on file  Occupational History   Not on file  Tobacco Use   Smoking status: Never   Smokeless tobacco: Never  Substance and Sexual Activity   Alcohol use: No    Alcohol/week: 0.0 standard drinks   Drug use: No   Sexual activity: Not on file  Other Topics Concern   Not on file  Social History Narrative   Not on file   Social Determinants of Health  Financial Resource Strain: Not on file  Food Insecurity: Not on file  Transportation Needs: Not on file  Physical Activity: Not on file  Stress: Not on file  Social Connections: Not on file    Tobacco Counseling Counseling given: Not Answered   Clinical Intake:                 Diabetic?yes         Activities of Daily Living    09/29/2021    4:42 PM 09/24/2021    4:12 PM  In your present state of health, do you have any difficulty performing the following activities:  Hearing? 0 0  Vision? 1 1  Difficulty concentrating or making decisions? 1 1  Walking or climbing stairs? 1 1  Dressing or bathing? 1 1  Doing errands, shopping? 1 1    Patient Care Team: Tyson Alias, MD as PCP - General (Internal Medicine) Ernesto Rutherford, MD as Consulting  Physician (Ophthalmology)  Indicate any recent Medical Services you may have received from other than [facility] providers in the past year (date may be approximate).     Assessment:   This is a routine wellness examination for Linda Lynn.  Hearing/Vision screen No results found.  Dietary issues and exercise activities discussed:     Goals Addressed   None   Depression Screen    09/29/2021    4:43 PM 09/24/2021    4:12 PM 09/09/2021    5:04 PM 06/16/2021    3:17 PM 11/04/2020   11:21 AM 03/25/2020    1:14 PM 12/13/2019   10:17 AM  PHQ 2/9 Scores  PHQ - 2 Score 0 0 0 0 0 0 0  PHQ- 9 Score    0     Exception Documentation Medical reason Other- indicate reason in comment box         Fall Risk    09/29/2021    4:42 PM 09/24/2021    4:11 PM 09/09/2021    5:03 PM 06/16/2021    3:17 PM 11/04/2020   11:19 AM  Fall Risk   Falls in the past year? 0 0 0 0 0  Number falls in past yr: 0 0   0  Injury with Fall? 0 0   0  Risk for fall due to : Impaired vision Impaired vision Impaired balance/gait;Impaired mobility;Impaired vision Impaired balance/gait Impaired vision  Follow up Falls evaluation completed Falls evaluation completed Falls evaluation completed  Falls evaluation completed    FALL RISK PREVENTION PERTAINING TO THE HOME:  Any stairs in or around the home? No  If so, are there any without handrails?  N/A Home free of loose throw rugs in walkways, pet beds, electrical cords, etc? Yes  Adequate lighting in your home to reduce risk of falls? Yes   ASSISTIVE DEVICES UTILIZED TO PREVENT FALLS:  Life alert? No  Use of a cane, walker or w/c? No  Grab bars in the bathroom? Yes  Shower chair or bench in shower? No  Elevated toilet seat or a handicapped toilet? No   TIMED UP AND GO:  Was the test performed? N/A Length of time to ambulate 10 feet: 0 sec.     Cognitive Function:        Immunizations Immunization History  Administered Date(s) Administered   Influenza Split  08/06/2011   Influenza Whole 08/31/2007   Influenza,inj,Quad PF,6+ Mos 07/24/2014, 12/30/2015, 07/28/2016, 01/10/2018, 10/03/2018, 08/10/2019, 11/04/2020, 09/24/2021   PFIZER(Purple Top)SARS-COV-2 Vaccination 03/25/2020, 04/13/2020   Pneumococcal Polysaccharide-23  02/28/2013, 04/18/2018   Td 10/27/2010   Tdap 06/16/2021    TDAP status: Up to date  Flu Vaccine status: Up to date  Pneumococcal vaccine status: Up to date  Covid-19 vaccine status: Completed vaccines  Qualifies for Shingles Vaccine? Yes   Zostavax completed No   Shingrix Completed?: No.    Education has been provided regarding the importance of this vaccine. Patient has been advised to call insurance company to determine out of pocket expense if they have not yet received this vaccine. Advised may also receive vaccine at local pharmacy or Health Dept. Verbalized acceptance and understanding.  Screening Tests Health Maintenance  Topic Date Due   Zoster Vaccines- Shingrix (1 of 2) Never done   COVID-19 Vaccine (3 - Booster for Pfizer series) 06/08/2020   HEMOGLOBIN A1C  12/25/2021   PAP SMEAR-Modifier  01/04/2022   COLONOSCOPY (Pts 45-75yrs Insurance coverage will need to be confirmed)  03/24/2022   INFLUENZA VACCINE  06/16/2022   FOOT EXAM  08/18/2022   MAMMOGRAM  08/12/2023   TETANUS/TDAP  06/17/2031   Hepatitis C Screening  Completed   HIV Screening  Completed   HPV VACCINES  Aged Out    Health Maintenance  Health Maintenance Due  Topic Date Due   Zoster Vaccines- Shingrix (1 of 2) Never done   COVID-19 Vaccine (3 - Booster for Pfizer series) 06/08/2020   HEMOGLOBIN A1C  12/25/2021   PAP SMEAR-Modifier  01/04/2022    Colorectal cancer screening: Type of screening: Colonoscopy. Completed 03/24/2012. Repeat every 10 years  Mammogram status: Completed  . Repeat every year   Lung Cancer Screening: (Low Dose CT Chest recommended if Age 40-80 years, 30 pack-year currently smoking OR have quit w/in 15years.)  does not qualify.   Lung Cancer Screening Referral: N/A  Additional Screening:  Hepatitis C Screening: does qualify; Completed 01/10/2018  Vision Screening: Recommended annual ophthalmology exams for early detection of glaucoma and other disorders of the eye. Is the patient up to date with their annual eye exam?  No  Who is the provider or what is the name of the office in which the patient attends annual eye exams? DrGroat (per family-there is nothing further they can do about her vision)  If pt is not established with a provider, would they like to be referred to a provider to establish care? No .   Dental Screening: Recommended annual dental exams for proper oral hygiene  Community Resource Referral / Chronic Care Management: CRR required this visit?  No   CCM required this visit?  No      Plan:     I have personally reviewed and noted the following in the patient's chart:   Medical and social history Use of alcohol, tobacco or illicit drugs  Current medications and supplements including opioid prescriptions. Patient is not currently taking opioid prescriptions. Functional ability and status Nutritional status Physical activity Advanced directives List of other physicians Hospitalizations, surgeries, and ER visits in previous 12 months Vitals Screenings to include cognitive, depression, and falls Referrals and appointments  In addition, I have reviewed and discussed with patient certain preventive protocols, quality metrics, and best practice recommendations. A written personalized care plan for preventive services as well as general preventive health recommendations were provided to patient.     Joslyn Devon, [place]   03/20/2022   Nurse Notes: non face to face 15 minutes  Ms. Dayton Scrape , Thank you for taking time to come for your Medicare Wellness Visit. I appreciate  your ongoing commitment to your health goals. Please review the following plan we discussed  and let me know if I can assist you in the future.   These are the goals we discussed:  Goals      Blood Pressure < 130/80     Blood Pressure < 140/90     HEMOGLOBIN A1C < 7.0     LDL CALC < 100        This is a list of the screening recommended for you and due dates:  Health Maintenance  Topic Date Due   Zoster (Shingles) Vaccine (1 of 2) Never done   COVID-19 Vaccine (3 - Booster for Pfizer series) 06/08/2020   Hemoglobin A1C  12/25/2021   Pap Smear  01/04/2022   Colon Cancer Screening  03/24/2022   Flu Shot  06/16/2022   Complete foot exam   08/18/2022   Mammogram  08/12/2023   Tetanus Vaccine  06/17/2031   Hepatitis C Screening: USPSTF Recommendation to screen - Ages 18-79 yo.  Completed   HIV Screening  Completed   HPV Vaccine  Aged Out

## 2022-04-25 ENCOUNTER — Other Ambulatory Visit: Payer: Self-pay | Admitting: Student in an Organized Health Care Education/Training Program

## 2022-05-25 ENCOUNTER — Encounter: Payer: Self-pay | Admitting: Internal Medicine

## 2022-06-15 ENCOUNTER — Encounter: Payer: Self-pay | Admitting: Internal Medicine

## 2022-06-29 ENCOUNTER — Ambulatory Visit (AMBULATORY_SURGERY_CENTER): Payer: Self-pay

## 2022-06-29 VITALS — Ht <= 58 in | Wt 139.0 lb

## 2022-06-29 DIAGNOSIS — Z1211 Encounter for screening for malignant neoplasm of colon: Secondary | ICD-10-CM

## 2022-06-29 NOTE — Progress Notes (Unsigned)
No egg or soy allergy known to patient  No issues known to pt with past sedation with any surgeries or procedures Patient denies ever being told they had issues or difficulty with intubation  No FH of Malignant Hyperthermia Pt is not on diet pills Pt is not on  home 02  Pt is not on blood thinners  Pt denies issues with constipation  No A fib or A flutter Have any cardiac testing pending--denied Pt instructed to use Singlecare.com or GoodRx for a price reduction on prep  Great niece  Tamera Stands present (cares for pt and her mother who has POA but is currently unable to attend visits) to assist with interview and sign permission.

## 2022-07-01 ENCOUNTER — Other Ambulatory Visit: Payer: Self-pay | Admitting: Student in an Organized Health Care Education/Training Program

## 2022-07-01 DIAGNOSIS — E119 Type 2 diabetes mellitus without complications: Secondary | ICD-10-CM

## 2022-07-29 NOTE — Progress Notes (Signed)
[facility] History and Physical   Primary Care Physician:  Tyson Alias, MD   Reason for Procedure:   CRCA screening  Plan:    colonoscopy     HPI: Linda Lynn is a 61 y.o. 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 Authorizing Provider  allopurinol (ZYLOPRIM) 100 MG tablet TAKE 1 TABLET BY MOUTH EVERY DAY ** DO NOT START MEDICATION UNTIL CURRENT GOUT FLARE IS RESOLVED 12/08/21   Tyson Alias, MD  amLODipine-valsartan (EXFORGE) 5-160 MG tablet TAKE 1 TABLET BY MOUTH EVERY DAY 04/27/22   Tyson Alias, MD  fluticasone [facility]) 50 MCG/ACT nasal spray Place 1 spray into both nostrils daily. 03/25/20 03/25/21  Tyson Alias, MD  metFORMIN (GLUCOPHAGE) 500 MG tablet TAKE 1 TABLET BY MOUTH 2 TIMES DAILY WITH A MEAL. 07/01/22   Dickie La, MD  Multiple Vitamins-Minerals (VITAMIN D3 COMPLETE PO) Take by mouth.    [provider]  pravastatin (PRAVACHOL) 20 MG tablet TAKE 1 TABLET BY MOUTH EVERY DAY 11/04/21   Tyson Alias, MD    Current Outpatient Medications  Medication Sig Dispense Refill   allopurinol (ZYLOPRIM) 100 MG tablet TAKE 1 TABLET BY MOUTH EVERY DAY ** DO NOT START MEDICATION UNTIL CURRENT GOUT FLARE IS RESOLVED 90 tablet 3   amLODipine-valsartan (EXFORGE) 5-160 MG tablet TAKE 1 TABLET BY MOUTH EVERY DAY 90 tablet 3   metFORMIN (GLUCOPHAGE) 500 MG tablet TAKE 1 TABLET BY MOUTH 2 TIMES DAILY WITH A MEAL. 180 tablet 1   Multiple Vitamins-Minerals (VITAMIN D3 COMPLETE PO) Take by mouth.     pravastatin (PRAVACHOL)  20 MG tablet TAKE 1 TABLET BY MOUTH EVERY DAY 90 tablet 3   fluticasone (FLONASE) 50 MCG/ACT nasal spray Place 1 spray into both nostrils daily. 16 g 2   Current Facility-Administered Medications  Medication Dose Route Frequency Provider Last Rate Last Admin   0.9 %  sodium chloride infusion  500 mL Intravenous Once Linda Boop, MD        Allergies as of 07/30/2022   (No Known Allergies)    Family History  Problem Relation Age of Onset   Diabetes Sister    Hypertension Sister    Esophageal cancer Sister    Diabetes Sister    Hypertension Sister    Colon cancer Cousin    Colon polyps Neg Hx    Rectal cancer Neg Hx    Stomach cancer Neg Hx     Social History   Socioeconomic History   Marital status: Single    Spouse name: Not on file   Number of children: Not on file   Years of education: Not on file   Highest education level: Not on file  Occupational History   Not on file  Tobacco Use   Smoking status: Never   Smokeless tobacco: Never  Substance and Sexual Activity   Alcohol use: No    Alcohol/week: 0.0 standard drinks of alcohol   Drug use: No  Sexual activity: Not on file  Other Topics Concern   Not on file  Social History Narrative   Not on file   Social Determinants of Health   Financial Resource Strain: Low Risk  (03/20/2022)   Overall Financial Resource Strain (CARDIA)    Difficulty of Paying Living Expenses: Not very hard  Food Insecurity: No Food Insecurity (03/20/2022)   Hunger Vital Sign    Worried About Running Out of Food in the Last Year: Never true    Ran Out of Food in the Last Year: Never true  Transportation Needs: No Transportation Needs (03/20/2022)   PRAPARE - Administrator, Civil Service (Medical): No    Lack of Transportation (Non-Medical): No  Physical Activity: Inactive (03/20/2022)   Exercise Vital Sign    Days of Exercise per Week: 0 days    Minutes of Exercise per Session: 0 min  Stress: No Stress Concern Present  (03/20/2022)   [employer] of Occupational Health - Occupational Stress Questionnaire    Feeling of Stress : Not at all  Social Connections: Not on file  Intimate Partner Violence: Not At Risk (03/20/2022)   Humiliation, Afraid, Rape, and Kick questionnaire    Fear of Current or Ex-Partner: No    Emotionally Abused: No    Physically Abused: No    Sexually Abused: No    Review of Systems:  All other review of systems negative except as mentioned in the HPI.  Physical Exam: Vital signs BP 126/89   Pulse 88   Temp (!) 97.5 F (36.4 C)   Resp 14   Ht 4\' 8"  (1.422 m)   Wt 139 lb (63 kg)   LMP 03/02/2008   SpO2 100%   BMI 31.16 kg/m   General:   Alert,  Well-developed, well-nourished, pleasant and cooperative in NAD Lungs:  Clear throughout to auscultation.   Heart:  Regular rate and rhythm; no murmurs, clicks, rubs,  or gallops. Abdomen:  Soft, nontender and nondistended. Normal bowel sounds. Small soft umbilical hernia   Neuro/Psych:  Alert and cooperative. Normal mood and affect. A and O x 3   @Nyron Mozer  03/04/2008, MD, [facility] [phone] (pager) 07/30/2022 10:49 AM@

## 2022-07-30 ENCOUNTER — Encounter: Payer: Self-pay | Admitting: Internal Medicine

## 2022-07-30 ENCOUNTER — Ambulatory Visit (AMBULATORY_SURGERY_CENTER): Payer: Medicare Other | Admitting: Internal Medicine

## 2022-07-30 VITALS — BP 126/57 | HR 47 | Temp 97.5°F | Resp 12 | Ht <= 58 in | Wt 139.0 lb

## 2022-07-30 DIAGNOSIS — Z1211 Encounter for screening for malignant neoplasm of colon: Secondary | ICD-10-CM | POA: Diagnosis not present

## 2022-07-30 MED ORDER — SODIUM CHLORIDE 0.9 % IV SOLN: 500.0000 mL | Freq: Once | INTRAVENOUS | Status: DC

## 2022-07-30 NOTE — Patient Instructions (Addendum)
No polyps or cancer were seen. Your hemorrhoids were a bit swollen.   Next routine colonoscopy or other screening test in 10 years - 2033.  I appreciate the opportunity to care for you. Iva Boop, MD, [facility]  **Handout given on hemorrhoids**  YOU HAD AN ENDOSCOPIC PROCEDURE TODAY AT THE [facility]:   Refer to the procedure report that was given to you for any specific questions about what was found during the examination.  If the procedure report does not answer your questions, please call your gastroenterologist to clarify.  If you requested that your care partner not be given the details of your procedure findings, then the procedure report has been included in a sealed envelope for you to review at your convenience later.  YOU SHOULD EXPECT: Some feelings of bloating in the abdomen. Passage of more gas than usual.  Walking can help get rid of the air that was put into your GI tract during the procedure and reduce the bloating. If you had a lower endoscopy (such as a colonoscopy or flexible sigmoidoscopy) you may notice spotting of blood in your stool or on the toilet paper. If you underwent a bowel prep for your procedure, you may not have a normal bowel movement for a few days.  Please Note:  You might notice some irritation and congestion in your nose or some drainage.  This is from the oxygen used during your procedure.  There is no need for concern and it should clear up in a day or so.  SYMPTOMS TO REPORT IMMEDIATELY:  Following lower endoscopy (colonoscopy or flexible sigmoidoscopy):  Excessive amounts of blood in the stool  Significant tenderness or worsening of abdominal pains  Swelling of the abdomen that is new, acute  Fever of 100F or higher   For urgent or emergent issues, a gastroenterologist can be reached at any hour by calling [phone]. Do not use MyChart messaging for urgent concerns.    DIET:  We do recommend a small meal at first, but then  you may proceed to your regular diet.  Drink plenty of fluids but you should avoid alcoholic beverages for 24 hours.  ACTIVITY:  You should plan to take it easy for the rest of today and you should NOT DRIVE or use heavy machinery until tomorrow (because of the sedation medicines used during the test).    FOLLOW UP: Our staff will call the number listed on your records the next business day following your procedure.  We will call around 7:15- 8:00 am to check on you and address any questions or concerns that you may have regarding the information given to you following your procedure. If we do not reach you, we will leave a message.     If any biopsies were taken you will be contacted by phone or by letter within the next 1-3 weeks.  Please call us at [phone] if you have not heard about the biopsies in 3 weeks.    SIGNATURES/CONFIDENTIALITY: You and/or your care partner have signed paperwork which will be entered into your electronic medical record.  These signatures attest to the fact that that the information above on your After Visit Summary has been reviewed and is understood.  Full responsibility of the confidentiality of this discharge information lies with you and/or your care-partner.

## 2022-07-30 NOTE — Progress Notes (Signed)
Pt has Down's syndrome.  Pt sitting up in stretcher.  Induction agent given in small dose to allow pt to get sleepy and lay flat.  IV in R AC.  Pt had bicep flexed naturally.  I had to help her relax her arm to get IV to run but she would immediately flex arm.  Two other trial inductions given and pt still sitting up. Had Justina help me with straightening her arm, pulling back on IV and squezzing bag.  IV noted to be infiltrated.  Hard to tell with IV in [facility], pt flexing arm and pt unable to communicate.  IV restarted in L AC one stick by me.  BP cuff switched to R arm

## 2022-07-30 NOTE — Op Note (Signed)
[facility] Patient Name: Linda Lynn Procedure Date: 07/30/2022 10:38 AM MRN: [id number] Endoscopist: Iva Boop , MD Age: 61 Referring MD:  Date of Birth: 04-17-1961 Gender: Female Account #: [id number] Procedure:                Colonoscopy Indications:              Screening for colorectal malignant neoplasm, Last                            colonoscopy: 2013 Medicines:                Monitored Anesthesia Care Procedure:                Pre-Anesthesia Assessment:                           - Prior to the procedure, a History and Physical                            was performed, and patient medications and                            allergies were reviewed. The patient's tolerance of                            previous anesthesia was also reviewed. The risks                            and benefits of the procedure and the sedation                            options and risks were discussed with the patient.                            All questions were answered, and informed consent                            was obtained. Prior Anticoagulants: The patient has                            taken no previous anticoagulant or antiplatelet                            agents. ASA Grade Assessment: III - A patient with                            severe systemic disease. After reviewing the risks                            and benefits, the patient was deemed in                            satisfactory condition to undergo the procedure.  After obtaining informed consent, the colonoscope                            was passed under direct vision. Throughout the                            procedure, the patient's blood pressure, pulse, and                            oxygen saturations were monitored continuously. The                            Olympus CF-HQ190L [id number]) Colonoscope was                            introduced through the anus and advanced to  the the                            cecum, identified by appendiceal orifice and                            ileocecal valve. The colonoscopy was somewhat                            difficult due to significant looping. Successful                            completion of the procedure was aided by applying                            abdominal pressure. The patient tolerated the                            procedure well. The quality of the bowel                            preparation was good. The ileocecal valve,                            appendiceal orifice, and rectum were photographed.                            The bowel preparation used was Miralax via split                            dose instruction. Scope In: 11:08:57 AM Scope Out: 11:24:24 AM Scope Withdrawal Time: 0 hours 8 minutes 52 seconds  Total Procedure Duration: 0 hours 15 minutes 27 seconds  Findings:                 The digital rectal exam findings include decreased                            sphincter tone.  External and internal hemorrhoids were found.                           The exam was otherwise without abnormality on                            direct and retroflexion views. Complications:            No immediate complications. Estimated Blood Loss:     Estimated blood loss: none. Impression:               - Decreased sphincter tone found on digital rectal                            exam.                           - External and internal hemorrhoids.                           - The examination was otherwise normal on direct                            and retroflexion views.                           - No specimens collected. Recommendation:           - Patient has a contact number available for                            emergencies. The signs and symptoms of potential                            delayed complications were discussed with the                            patient. Return to  normal activities tomorrow.                            Written discharge instructions were provided to the                            patient.                           - Resume previous diet.                           - Continue present medications.                           - Repeat colonoscopy in 10 years for screening                            purposes. Iva Boop, MD 07/30/2022 11:34:24 AM This report has been signed electronically.

## 2022-07-30 NOTE — Progress Notes (Signed)
Report to PACU, RN, vss, BBS= Clear.  

## 2022-07-31 ENCOUNTER — Telehealth: Payer: Self-pay | Admitting: *Deleted

## 2022-07-31 NOTE — Telephone Encounter (Signed)
  Follow up Call-     07/30/2022   10:01 AM  Call back number  Post procedure Call Back phone  # [phone]  Permission to leave phone message Yes     Patient questions:  Do you have a fever, pain , or abdominal swelling? No. Pain Score  0 *  Have you tolerated food without any problems? Yes.    Have you been able to return to your normal activities? Yes.    Do you have any questions about your discharge instructions: Diet   No. Medications  No. Follow up visit  No.  Do you have questions or concerns about your Care? No.  Actions: * If pain score is 4 or above: No action needed, pain <4.

## 2022-09-22 ENCOUNTER — Other Ambulatory Visit: Payer: Self-pay | Admitting: Student in an Organized Health Care Education/Training Program

## 2022-09-22 DIAGNOSIS — Z1231 Encounter for screening mammogram for malignant neoplasm of breast: Secondary | ICD-10-CM

## 2022-10-07 ENCOUNTER — Other Ambulatory Visit: Payer: Self-pay | Admitting: Student in an Organized Health Care Education/Training Program

## 2022-10-30 NOTE — Telephone Encounter (Signed)
A user error has taken place: encounter opened in error, closed for administrative reasons.

## 2022-11-05 DIAGNOSIS — L929 Granulomatous disorder of the skin and subcutaneous tissue, unspecified: Secondary | ICD-10-CM | POA: Diagnosis not present

## 2022-11-05 DIAGNOSIS — H6122 Impacted cerumen, left ear: Secondary | ICD-10-CM | POA: Diagnosis not present

## 2022-11-19 ENCOUNTER — Ambulatory Visit
Admission: RE | Admit: 2022-11-19 | Discharge: 2022-11-19 | Disposition: A | Discharge status: Home / Self Care | Payer: Medicare Other | Source: Ambulatory Visit | Attending: Student in an Organized Health Care Education/Training Program | Admitting: Student in an Organized Health Care Education/Training Program

## 2022-11-19 DIAGNOSIS — Z1231 Encounter for screening mammogram for malignant neoplasm of breast: Secondary | ICD-10-CM | POA: Diagnosis not present

## 2022-12-02 ENCOUNTER — Other Ambulatory Visit: Payer: Self-pay | Admitting: Student in an Organized Health Care Education/Training Program

## 2022-12-07 DIAGNOSIS — H6122 Impacted cerumen, left ear: Secondary | ICD-10-CM | POA: Diagnosis not present

## 2022-12-28 ENCOUNTER — Encounter: Payer: 59 | Admitting: Student in an Organized Health Care Education/Training Program

## 2023-02-22 ENCOUNTER — Ambulatory Visit (INDEPENDENT_AMBULATORY_CARE_PROVIDER_SITE_OTHER): Payer: 59 | Admitting: Student in an Organized Health Care Education/Training Program

## 2023-02-22 ENCOUNTER — Encounter: Payer: Self-pay | Admitting: Student in an Organized Health Care Education/Training Program

## 2023-02-22 VITALS — BP 125/68 | HR 98 | Temp 97.7°F | Ht <= 58 in | Wt 133.6 lb

## 2023-02-22 DIAGNOSIS — E1169 Type 2 diabetes mellitus with other specified complication: Secondary | ICD-10-CM | POA: Diagnosis not present

## 2023-02-22 DIAGNOSIS — Z7984 Long term (current) use of oral hypoglycemic drugs: Secondary | ICD-10-CM

## 2023-02-22 DIAGNOSIS — I10 Essential (primary) hypertension: Secondary | ICD-10-CM

## 2023-02-22 DIAGNOSIS — E785 Hyperlipidemia, unspecified: Secondary | ICD-10-CM | POA: Diagnosis not present

## 2023-02-22 DIAGNOSIS — Z0000 Encounter for general adult medical examination without abnormal findings: Secondary | ICD-10-CM

## 2023-02-22 DIAGNOSIS — E119 Type 2 diabetes mellitus without complications: Secondary | ICD-10-CM

## 2023-02-22 DIAGNOSIS — M1000 Idiopathic gout, unspecified site: Secondary | ICD-10-CM | POA: Diagnosis not present

## 2023-02-22 LAB — POCT GLYCOSYLATED HEMOGLOBIN (HGB A1C): Hemoglobin A1C: 6.3 % — AB (ref 4.0–5.6)

## 2023-02-22 LAB — GLUCOSE, CAPILLARY: Glucose-Capillary: 128 mg/dL — ABNORMAL HIGH (ref 70–99)

## 2023-02-22 MED ORDER — METFORMIN HCL 500 MG PO TABS
500.0000 mg | ORAL_TABLET | Freq: Two times a day (BID) | ORAL | 1 refills | Status: DC
Start: 2023-02-22 — End: 2024-01-05

## 2023-02-22 NOTE — Assessment & Plan Note (Signed)
No recent gout flares. Chronic and stable problem. Will continue allopurinol today and check uric acid and BMP.

## 2023-02-22 NOTE — Assessment & Plan Note (Signed)
Chronic and stable with A1c at goal today. Plan to continue with metformin 500mg  bid. Foot exam normal today.

## 2023-02-22 NOTE — Assessment & Plan Note (Signed)
Chronic and stable. BP is well controlled today. Plan to continue with amlodipine and valsartan combination. Check BMP today.

## 2023-02-22 NOTE — Assessment & Plan Note (Signed)
Chronic and stable. Plan to check lipids today and continue with pravastatin 20mg  daily for primary prevention of ischemic events.

## 2023-02-22 NOTE — Progress Notes (Signed)
   Established Patient Office Visit  Subjective   Patient ID: Linda Lynn, female    DOB: 1961-09-19  Age: 62 y.o. MRN: [id number]  Chief Complaint  Patient presents with   Diabetes   Eye Problem    HPI  62 year old person living with Down syndrome here for management of diabetes and hypertension. She is doing well, over one year since our last visit. No acute concerns today from her caregiver. Linda Lynn lives with her sister and other family who provide her with 24 hour care. She is dependent in many ADLs like dressing and toileting. She feeds herself, but needs meals provided for her. She has vision impairment as well. She goes for a walk every day, not limited by dyspnea or discomfort. She takes medications that are provided to her. Has some issues with poor sleep at night, and too much napping in the daytime. No recent hospitalization or ED visit. No falls at home.   ROS  Denies discomfort, dsynpnea, or chest pain.     Objective:     BP 125/68 (BP Location: Left Arm, Patient Position: Sitting, Cuff Size: Small)   Pulse 98   Temp 97.7 F (36.5 C) (Oral)   Ht 4\' 8"  (1.422 m)   Wt 133 lb 9.6 oz (60.6 kg)   LMP 03/02/2008   SpO2 100%   BMI 29.95 kg/m    Physical Exam  Gen: well appearing woman, gets up to the table with significant assistance for navigating the obstacles in the room.  Neck: no thyromegaly or lymphadenopathy Lungs: Clear, shallow breath sounds Heart: little bradycardic, 2/6 early systolic murmur Ext: warm, well perfused, no edema    Assessment & Plan:   Problem List Items Addressed This Visit       High   Type 2 diabetes mellitus with other specified complication - Primary (Chronic)    Chronic and stable with A1c at goal today. Plan to continue with metformin 500mg  bid. Foot exam normal today.       Relevant Medications   metFORMIN (GLUCOPHAGE) 500 MG tablet   Other Relevant Orders   POC Hbg A1C (Completed)   BMP8+Anion Gap     Medium    Gout  (Chronic)    No recent gout flares. Chronic and stable problem. Will continue allopurinol today and check uric acid and BMP.       Relevant Orders   Uric acid   Hyperlipidemia associated with type 2 diabetes mellitus (Chronic)    Chronic and stable. Plan to check lipids today and continue with pravastatin 20mg  daily for primary prevention of ischemic events.      Relevant Medications   metFORMIN (GLUCOPHAGE) 500 MG tablet   Other Relevant Orders   Lipid Profile     Low   Essential hypertension (Chronic)    Chronic and stable. BP is well controlled today. Plan to continue with amlodipine and valsartan combination. Check BMP today.      Healthcare maintenance (Chronic)    I recommend zoster vaccination today at the pharmacy. Foot exam completed and normal. I recommended cervical cancer screening at our next visit.      Other Visit Diagnoses     Type 2 diabetes mellitus without complication, without long-term current use of insulin  (Chronic)      Relevant Medications   metFORMIN (GLUCOPHAGE) 500 MG tablet       Return in about 4 months (around 06/24/2023).    Tyson Alias, MD

## 2023-02-22 NOTE — Assessment & Plan Note (Signed)
I recommend zoster vaccination today at the pharmacy. Foot exam completed and normal. I recommended cervical cancer screening at our next visit.

## 2023-02-23 ENCOUNTER — Encounter: Payer: Self-pay | Admitting: Student in an Organized Health Care Education/Training Program

## 2023-02-23 LAB — URIC ACID: Uric Acid: 5.1 mg/dL (ref 3.0–7.2)

## 2023-02-23 LAB — LIPID PANEL
Chol/HDL Ratio: 2.5 ratio (ref 0.0–4.4)
Cholesterol, Total: 193 mg/dL (ref 100–199)
HDL: 76 mg/dL (ref >39)
LDL Chol Calc (NIH): 106 mg/dL — ABNORMAL HIGH (ref 0–99)
Triglycerides: 58 mg/dL (ref 0–149)
VLDL Cholesterol Cal: 11 mg/dL (ref 5–40)

## 2023-02-23 LAB — BMP8+ANION GAP
Anion Gap: 16 mmol/L (ref 10.0–18.0)
BUN/Creatinine Ratio: 19 (ref 12–28)
BUN: 24 mg/dL (ref 8–27)
CO2: 24 mmol/L (ref 20–29)
Calcium: 9.6 mg/dL (ref 8.7–10.3)
Chloride: 102 mmol/L (ref 96–106)
Creatinine, Ser: 1.24 mg/dL — ABNORMAL HIGH (ref 0.57–1.00)
Glucose: 137 mg/dL — ABNORMAL HIGH (ref 70–99)
Potassium: 4.4 mmol/L (ref 3.5–5.2)
Sodium: 142 mmol/L (ref 134–144)
eGFR: 49 mL/min/{1.73_m2} — ABNORMAL LOW (ref >59)

## 2023-06-17 ENCOUNTER — Other Ambulatory Visit: Payer: Self-pay | Admitting: Student in an Organized Health Care Education/Training Program

## 2023-06-28 ENCOUNTER — Ambulatory Visit (INDEPENDENT_AMBULATORY_CARE_PROVIDER_SITE_OTHER): Payer: 59 | Admitting: Student in an Organized Health Care Education/Training Program

## 2023-06-28 ENCOUNTER — Encounter: Payer: Self-pay | Admitting: Student in an Organized Health Care Education/Training Program

## 2023-06-28 VITALS — BP 128/61 | HR 99 | Ht <= 58 in | Wt 134.7 lb

## 2023-06-28 DIAGNOSIS — Z7984 Long term (current) use of oral hypoglycemic drugs: Secondary | ICD-10-CM

## 2023-06-28 DIAGNOSIS — E1169 Type 2 diabetes mellitus with other specified complication: Secondary | ICD-10-CM

## 2023-06-28 DIAGNOSIS — I10 Essential (primary) hypertension: Secondary | ICD-10-CM | POA: Diagnosis not present

## 2023-06-28 DIAGNOSIS — Z9181 History of falling: Secondary | ICD-10-CM | POA: Diagnosis not present

## 2023-06-28 DIAGNOSIS — Q909 Down syndrome, unspecified: Secondary | ICD-10-CM

## 2023-06-28 DIAGNOSIS — Z0000 Encounter for general adult medical examination without abnormal findings: Secondary | ICD-10-CM

## 2023-06-28 LAB — POCT GLYCOSYLATED HEMOGLOBIN (HGB A1C): Hemoglobin A1C: 6 % — AB (ref 4.0–5.6)

## 2023-06-28 LAB — GLUCOSE, CAPILLARY: Glucose-Capillary: 158 mg/dL — ABNORMAL HIGH (ref 70–99)

## 2023-06-28 NOTE — Progress Notes (Signed)
Established Patient Office Visit  Subjective   Patient ID: Linda Lynn, female    DOB: October 07, 1961  Age: 62 y.o. MRN: [id number]   HPI  62 year old person here for management of hypertension and diabetes.  Doing pretty well since I last saw her about 4 months ago.  Accompanied by her family today who helps care for all her activities of daily living.  Patient lives with a sister who is also advanced in age and has mobility issues.  Linda Lynn spends most of her days in the house, she is blind but knows that environment very well and is able to do most of her activities of daily living independently.  She needs help with any ambulation outside of the house.  They reports that it took her about 30 minutes to walk from the front of the hospital down to the clinic today because of a slow gait.  Denying any dyspnea with exertion, chest pain or pressure.  No recent illnesses.  No emergency department visits, hospitalizations, or visits with other physicians.  She has not had any falls at home but has had a couple near falls.  She does not walk with any assist devices.    Objective:     BP 128/61 (BP Location: Right Arm, Patient Position: Sitting, Cuff Size: Small)   Pulse 99   Ht 4\' 8"  (1.422 m)   Wt 134 lb 11.2 oz (61.1 kg)   LMP 03/02/2008   SpO2 100%   BMI 30.20 kg/m    Physical Exam  General: Frail-appearing older woman Neck: Normal thyroid, no lymphadenopathy Lungs: Clear throughout, normal work of breathing Heart: Bradycardic, no murmurs, distant heart sounds Extremities: Warm and well-perfused, no lower extremity edema, normal appearing joints Abdomen: Nondistended, nontender, no organomegaly     Assessment & Plan:   Problem List Items Addressed This Visit       High   Type 2 diabetes mellitus with other specified complication (HCC) - Primary (Chronic)    Stable and well-controlled with hemoglobin A1c of 6.0%.  Plan to continue with metformin 500 mg twice daily.  Recheck A1c  in 4 months.  Continuing pravastatin for primary prevention of ischemic event and valsartan for renal protection.      Relevant Orders   POC Hbg A1C (Completed)   Glucose, capillary (Completed)   Down's syndrome (Chronic)   Relevant Orders   Ambulatory referral to Physical Therapy   Ambulatory Referral for DME     Medium    At risk for falls (Chronic)    Patient with a slow, unsteady, and wide-based gait that seems to be worsening over the last year.  Etiology is likely multifactorial from vision impairment, Down syndrome, deconditioning, and possibly osteoarthritis.  She has had a few near falls at home.  Not yet due for osteoporosis screening.  Will refer to physical therapy for gait assessment and safety training, would like to see more conditioning.  Will prescribe manual wheelchair which I think will help with the patient with mobility and activities outside of the house.  She has a supportive family that we will help her to use the wheelchair.       Relevant Orders   Ambulatory referral to Physical Therapy   Ambulatory Referral for DME     Low   Essential hypertension (Chronic)    Blood pressure is stable and well-controlled.  Will plan to continue amlodipine and valsartan combination.  Renal function was acceptable at last visit.  Healthcare maintenance (Chronic)    Patient is up-to-date on colon and breast cancer screening, has had no issues.  We discussed cervical cancer screening today.  Last exam was in 2018 which was normal with negative HPV.  Family reports there have been no sexual encounters or other exposures to HPV for Linda Lynn.  She had a very difficult time completing at the pelvic exam at the last visit and family has elected to forego further cervical cancer screening which I think is reasonable given her very low risk of cervical cancer.       Return in about 4 months (around 10/28/2023).    Tyson Alias, MD

## 2023-06-28 NOTE — Assessment & Plan Note (Signed)
Stable and well-controlled with hemoglobin A1c of 6.0%.  Plan to continue with metformin 500 mg twice daily.  Recheck A1c in 4 months.  Continuing pravastatin for primary prevention of ischemic event and valsartan for renal protection.

## 2023-06-28 NOTE — Assessment & Plan Note (Signed)
Patient with a slow, unsteady, and wide-based gait that seems to be worsening over the last year.  Etiology is likely multifactorial from vision impairment, Down syndrome, deconditioning, and possibly osteoarthritis.  She has had a few near falls at home.  Not yet due for osteoporosis screening.  Will refer to physical therapy for gait assessment and safety training, would like to see more conditioning.  Will prescribe manual wheelchair which I think will help with the patient with mobility and activities outside of the house.  She has a supportive family that we will help her to use the wheelchair.

## 2023-06-28 NOTE — Assessment & Plan Note (Signed)
Patient is up-to-date on colon and breast cancer screening, has had no issues.  We discussed cervical cancer screening today.  Last exam was in 2018 which was normal with negative HPV.  Family reports there have been no sexual encounters or other exposures to HPV for Linda Lynn.  She had a very difficult time completing at the pelvic exam at the last visit and family has elected to forego further cervical cancer screening which I think is reasonable given her very low risk of cervical cancer.

## 2023-06-28 NOTE — Assessment & Plan Note (Signed)
Blood pressure is stable and well-controlled.  Will plan to continue amlodipine and valsartan combination.  Renal function was acceptable at last visit.

## 2023-07-26 ENCOUNTER — Ambulatory Visit: Payer: 59

## 2023-07-26 DIAGNOSIS — R293 Abnormal posture: Secondary | ICD-10-CM

## 2023-07-26 DIAGNOSIS — R2681 Unsteadiness on feet: Secondary | ICD-10-CM

## 2023-07-26 DIAGNOSIS — M6281 Muscle weakness (generalized): Secondary | ICD-10-CM

## 2023-07-26 NOTE — Therapy (Signed)
[facility] [address] [place], [place], [address] Phone: [phone]   Fax:  [phone]  Patient Details  Name: Linda Lynn MRN: [id number] Date of Birth: 1961/01/01 Referring Provider:  Tyson Alias  Encounter Date: 07/26/2023  Patient arriving to PT eval with niece. Per niece, they do not wish to pursue PT at this time, but do need a letter for ramp renovations. Once completed, the letter will be scanned into patients chart.   Westley Foots, PT Westley Foots, PT, DPT, CBIS  07/26/2023, 10:52 AM  [facility] [address] [place], [place], [address] Phone: [phone]   Fax:  [phone]

## 2023-07-26 NOTE — Therapy (Deleted)
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   Past Surgical History:  Procedure Laterality Date   COLONOSCOPY  2013   EYE SURGERY  11/16/2005   right   Patient Active Problem List   Diagnosis Date Noted   At risk for falls 06/28/2023   Bradycardia 01/10/2018   Blindness 04/06/2016   Hyperlipidemia associated with type 2 diabetes mellitus (HCC) 04/20/2012   Healthcare maintenance 08/06/2011   Gout 08/06/2010   Type 2 diabetes mellitus with other specified complication (HCC) 09/25/2006   Essential hypertension 09/25/2006   Down's syndrome 09/25/2006    ONSET DATE: 06/28/2023 referral  REFERRING DIAG: Q90.9 (ICD-10-CM) - Down's syndrome Z91.81 (ICD-10-CM) - At risk for falls  THERAPY DIAG:  No diagnosis found.  Rationale for Evaluation and Treatment: Rehabilitation  SUBJECTIVE:                                                                                                                                                                                             SUBJECTIVE STATEMENT: *** Pt accompanied by: {accompnied:27141}  PERTINENT HISTORY: HTN, DM2, Down's Syndrome  PAIN:  Are you having pain? {OPRCPAIN:27236}  PRECAUTIONS: Fall and Other: blind  RED FLAGS: {PT Red Flags:29287}   WEIGHT BEARING RESTRICTIONS: {Yes ***/No:24003}  FALLS: Has patient fallen in last 6 months? {fallsyesno:27318}  LIVING ENVIRONMENT: Lives with: {OPRC lives with:25569::"lives  with their family"} Lives in: {Lives in:25570} Stairs: {opstairs:27293} Has following equipment at home: {Assistive devices:23999}  PLOF: {PLOF:24004}  PATIENT GOALS: ***  OBJECTIVE:   DIAGNOSTIC FINDINGS: ***  COGNITION: Overall cognitive status: {cognition:24006}   SENSATION: {sensation:27233}  COORDINATION: ***  EDEMA:  {edema:24020}  MUSCLE TONE: {LE tone:25568}  MUSCLE LENGTH: Hamstrings: Right *** deg; Left *** deg Thomas test: Right *** deg; Left *** deg  DTRs:  {DTR SITE:24025}  POSTURE: {posture:25561}  LOWER EXTREMITY ROM:     {AROM/PROM:27142}  Right Eval Left Eval  Hip flexion    Hip extension    Hip abduction  Hip adduction    Hip internal rotation    Hip external rotation    Knee flexion    Knee extension    Ankle dorsiflexion    Ankle plantarflexion    Ankle inversion    Ankle eversion     (Blank rows = not tested)  LOWER EXTREMITY MMT:    MMT Right Eval Left Eval  Hip flexion    Hip extension    Hip abduction    Hip adduction    Hip internal rotation    Hip external rotation    Knee flexion    Knee extension    Ankle dorsiflexion    Ankle plantarflexion    Ankle inversion    Ankle eversion    (Blank rows = not tested)  BED MOBILITY:  {Bed mobility:24027}  TRANSFERS: Assistive device utilized: {Assistive devices:23999}  Sit to stand: {Levels of assistance:24026} Stand to sit: {Levels of assistance:24026} Chair to chair: {Levels of assistance:24026} Floor: {Levels of assistance:24026}  RAMP:  Level of Assistance: {Levels of assistance:24026} Assistive device utilized: {Assistive devices:23999} Ramp Comments: ***  CURB:  Level of Assistance: {Levels of assistance:24026} Assistive device utilized: {Assistive devices:23999} Curb Comments: ***  STAIRS: Level of Assistance: {Levels of assistance:24026} Stair Negotiation Technique: {Stair Technique:27161} with {Rail Assistance:27162} Number of Stairs:  ***  Height of Stairs: ***  Comments: ***  GAIT: Gait pattern: {gait characteristics:25376} Distance walked: *** Assistive device utilized: {Assistive devices:23999} Level of assistance: {Levels of assistance:24026} Comments: ***  FUNCTIONAL TESTS:  {Functional tests:24029}  PATIENT SURVEYS:  {rehab surveys:24030}  TODAY'S TREATMENT:                                                                                                                              DATE: ***    PATIENT EDUCATION: Education details: *** Person educated: {Person educated:25204} Education method: {Education Method:25205} Education comprehension: {Education Comprehension:25206}  HOME EXERCISE PROGRAM: ***  GOALS: Goals reviewed with patient? {yes/no:20286}  SHORT TERM GOALS: Target date: ***  *** Baseline: Goal status: INITIAL  2.  *** Baseline:  Goal status: INITIAL  3.  *** Baseline:  Goal status: INITIAL  4.  *** Baseline:  Goal status: INITIAL  5.  *** Baseline:  Goal status: INITIAL  6.  *** Baseline:  Goal status: INITIAL  LONG TERM GOALS: Target date: ***  *** Baseline:  Goal status: INITIAL  2.  *** Baseline:  Goal status: INITIAL  3.  *** Baseline:  Goal status: INITIAL  4.  *** Baseline:  Goal status: INITIAL  5.  *** Baseline:  Goal status: INITIAL  6.  *** Baseline:  Goal status: INITIAL  ASSESSMENT:  CLINICAL IMPRESSION: Patient is a *** y.o. *** who was seen today for physical therapy evaluation and treatment for ***.   OBJECTIVE IMPAIRMENTS: {opptimpairments:25111}.   ACTIVITY LIMITATIONS: {activitylimitations:27494}  PARTICIPATION LIMITATIONS: {participationrestrictions:25113}  PERSONAL FACTORS: {Personal factors:25162} are also affecting patient's functional outcome.   REHAB POTENTIAL: {rehabpotential:25112}  CLINICAL  DECISION MAKING: {clinical decision making:25114}  EVALUATION COMPLEXITY: {Evaluation  complexity:25115}  PLAN:  PT FREQUENCY: {rehab frequency:25116}  PT DURATION: {rehab duration:25117}  PLANNED INTERVENTIONS: {rehab planned interventions:25118::"Therapeutic exercises","Therapeutic activity","Neuromuscular re-education","Balance training","Gait training","Patient/Family education","Self Care","Joint mobilization"}  PLAN FOR NEXT SESSION: ***   Westley Foots, PT Westley Foots, PT, DPT, CBIS  07/26/2023, 9:58 AM

## 2023-07-27 NOTE — Therapy (Signed)
[facility] [address] [place], [place], [address] Phone: [phone]   Fax:  [phone]  Patient Details  Name: Linda Lynn MRN: [id number] Date of Birth: 1961-06-13 Referring Provider:  No ref. provider found  Encounter Date: 07/27/2023  Attempted to call the patient at 10am 07/27/23 to let her/her family know that the LMN for ramp repairs is available for pick up; however, no one answers and the voicemail box is not set up. PT will attempt again as able.   Westley Foots, PT Westley Foots, PT, DPT, CBIS  07/27/2023, 10:02 AM  [facility] [address] [place], [place], [address] Phone: [phone]   Fax:  [phone]

## 2023-08-09 ENCOUNTER — Ambulatory Visit: Payer: 59

## 2023-08-24 ENCOUNTER — Ambulatory Visit: Payer: 59

## 2023-08-24 VITALS — Wt 134.0 lb

## 2023-08-24 DIAGNOSIS — Z0000 Encounter for general adult medical examination without abnormal findings: Secondary | ICD-10-CM | POA: Diagnosis not present

## 2023-08-24 NOTE — Progress Notes (Signed)
Subjective:   Linda Lynn is a 62 y.o. female who presents for an Initial Medicare Annual Wellness Visit.  Visit Complete: Virtual I connected with  Linda Lynn on 08/24/23 by a audio enabled telemedicine application and verified that I am speaking with the correct person using two identifiers. Sister Linda Lynn completed due to limited communication for pt   Patient Location: Home  Provider Location: Office/Clinic  I discussed the limitations of evaluation and management by telemedicine. The patient expressed understanding and agreed to proceed.  Vital Signs: Because this visit was a virtual/telehealth visit, some criteria may be missing or patient reported. Any vitals not documented were not able to be obtained and vitals that have been documented are patient reported.         Objective:    Today's Vitals   08/24/23 1511  Weight: 134 lb (60.8 kg)   Body mass index is 30.04 kg/m.     08/24/2023    3:16 PM 07/26/2023   10:17 AM 03/20/2022   12:23 PM 09/29/2021    4:42 PM 09/24/2021    3:40 PM 09/09/2021    5:04 PM 06/16/2021    3:18 PM  Advanced Directives  Does Patient Have a Medical Advance Directive? No No Yes No No No Yes  Type of Advance Directive       Healthcare Power of Attorney  Would patient like information on creating a medical advance directive? No - Patient declined   No - Patient declined No - Patient declined No - Guardian declined     Current Medications (verified) Outpatient Encounter Medications as of 08/24/2023  Medication Sig   allopurinol (ZYLOPRIM) 100 MG tablet TAKE 1 TABLET BY MOUTH EVERY DAY ** DO NOT START MEDICATION UNTIL CURRENT GOUT FLARE IS RESOLVED   amLODipine-valsartan (EXFORGE) 5-160 MG tablet TAKE 1 TABLET BY MOUTH EVERY DAY   metFORMIN (GLUCOPHAGE) 500 MG tablet Take 1 tablet (500 mg total) by mouth 2 (two) times daily with a meal.   Multiple Vitamins-Minerals (VITAMIN D3 COMPLETE PO) Take by mouth.   pravastatin (PRAVACHOL) 20 MG  tablet TAKE 1 TABLET BY MOUTH EVERY DAY   fluticasone (FLONASE) 50 MCG/ACT nasal spray Place 1 spray into both nostrils daily.   No facility-administered encounter medications on file as of 08/24/2023.    Allergies (verified) Patient has no known allergies.   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   Past Surgical History:  Procedure Laterality Date   COLONOSCOPY  2013   EYE SURGERY  11/16/2005   right   Family History  Problem Relation Age of Onset   Diabetes Sister    Hypertension Sister    Esophageal cancer Sister    Diabetes Sister    Hypertension Sister    Colon cancer Cousin    Colon polyps Neg Hx    Rectal cancer Neg Hx    Stomach cancer Neg Hx    Social History   Socioeconomic History   Marital status: Single    Spouse name: Not on file   Number of children: Not on file   Years of education: Not on file   Highest education level: Not on file  Occupational History   Not on file  Tobacco Use   Smoking status: Never  Smokeless tobacco: Never  Substance and Sexual Activity   Alcohol use: No    Alcohol/week: 0.0 standard drinks of alcohol   Drug use: No   Sexual activity: Not on file  Other Topics Concern   Not on file  Social History Narrative   Not on file   Social Determinants of Health   Financial Resource Strain: Low Risk  (08/24/2023)   Overall Financial Resource Strain (CARDIA)    Difficulty of Paying Living Expenses: Not hard at all  Food Insecurity: No Food Insecurity (08/24/2023)   Hunger Vital Sign    Worried About Running Out of Food in the Last Year: Never true    Ran Out of Food in the Last Year: Never true  Transportation Needs: No Transportation Needs (08/24/2023)   PRAPARE - Administrator, Civil Service (Medical): No    Lack of Transportation  (Non-Medical): No  Physical Activity: Inactive (08/24/2023)   Exercise Vital Sign    Days of Exercise per Week: 0 days    Minutes of Exercise per Session: 0 min  Stress: No Stress Concern Present (08/24/2023)   [employer] of Occupational Health - Occupational Stress Questionnaire    Feeling of Stress : Not at all  Social Connections: Socially Isolated (08/24/2023)   Social Connection and Isolation Panel [NHANES]    Frequency of Communication with Friends and Family: Once a week    Frequency of Social Gatherings with Friends and Family: Once a week    Attends Religious Services: Never    Database administrator or Organizations: No    Attends Engineer, structural: Never    Marital Status: Never married    Tobacco Counseling Counseling given: Not Answered   Clinical Intake:  Pre-visit preparation completed: Yes  Pain : No/denies pain     BMI - recorded: 30.04 Nutritional Status: BMI > 30  Obese Nutritional Risks: None Diabetes: Yes CBG done?: No Did pt. bring in CBG monitor from home?: No  How often do you need to have someone help you when you read instructions, pamphlets, or other written materials from your doctor or pharmacy?: 1 - Never  Interpreter Needed?: No (sister communicate for her)  Information entered by :: Lanier Ensign, LPN   Activities of Daily Living    02/22/2023    9:25 AM  In your present state of health, do you have any difficulty performing the following activities:  Hearing? 1  Vision? 1  Difficulty concentrating or making decisions? 1  Walking or climbing stairs? 0  Dressing or bathing? 1  Doing errands, shopping? 1    Patient Care Team: Tyson Alias, MD as PCP - General (Internal Medicine) Ernesto Rutherford, MD as Consulting Physician (Ophthalmology)  Indicate any recent Medical Services you may have received from other than [facility] providers in the past year (date may be approximate).     Assessment:   This is a  routine wellness examination for Linda Lynn.  Hearing/Vision screen Hearing Screening - Comments:: Pt stated HOH  Vision Screening - Comments:: Pt is legally blind    Goals Addressed             This Visit's Progress    Patient Stated       Continue to maintain health        Depression Screen    08/24/2023    3:14 PM 02/22/2023    9:26 AM 09/29/2021    4:43 PM 09/24/2021    4:12 PM 09/09/2021  5:04 PM 06/16/2021    3:17 PM 11/04/2020   11:21 AM  PHQ 2/9 Scores  PHQ - 2 Score 0 0 0 0 0 0 0  PHQ- 9 Score      0   Exception Documentation   Medical reason Other- indicate reason in comment box       Fall Risk    08/24/2023    3:17 PM 02/22/2023    9:25 AM 09/29/2021    4:42 PM 09/24/2021    4:11 PM 09/09/2021    5:03 PM  Fall Risk   Falls in the past year? 0 0 0 0 0  Number falls in past yr: 0 0 0 0   Injury with Fall? 0 0 0 0   Risk for fall due to : Impaired vision  Impaired vision Impaired vision Impaired balance/gait;Impaired mobility;Impaired vision  Follow up Falls prevention discussed Falls evaluation completed Falls evaluation completed Falls evaluation completed Falls evaluation completed    MEDICARE RISK AT HOME: Medicare Risk at Home Any stairs in or around the home?: No If so, are there any without handrails?: No Home free of loose throw rugs in walkways, pet beds, electrical cords, etc?: Yes Adequate lighting in your home to reduce risk of falls?: Yes Life alert?: No Use of a cane, walker or w/c?: No Grab bars in the bathroom?: Yes Shower chair or bench in shower?: Yes Elevated toilet seat or a handicapped toilet?: No  TIMED UP AND GO:  Was the test performed? No    Cognitive Function:        Immunizations Immunization History  Administered Date(s) Administered   Influenza Split 08/06/2011   Influenza Whole 08/31/2007   Influenza,inj,Quad PF,6+ Mos 07/24/2014, 12/30/2015, 07/28/2016, 01/10/2018, 10/03/2018, 08/10/2019, 11/04/2020, 09/24/2021    PFIZER(Purple Top)SARS-COV-2 Vaccination 03/25/2020, 04/13/2020   Pneumococcal Polysaccharide-23 02/28/2013, 04/18/2018   Td 10/27/2010   Tdap 06/16/2021    TDAP status: Up to date  Flu Vaccine status: Due, Education has been provided regarding the importance of this vaccine. Advised may receive this vaccine at local pharmacy or Health Dept. Aware to provide a copy of the vaccination record if obtained from local pharmacy or Health Dept. Verbalized acceptance and understanding.    Covid-19 vaccine status: Information provided on how to obtain vaccines.   Qualifies for Shingles Vaccine? Yes   Zostavax completed No   Shingrix Completed?: No.    Education has been provided regarding the importance of this vaccine. Patient has been advised to call insurance company to determine out of pocket expense if they have not yet received this vaccine. Advised may also receive vaccine at local pharmacy or Health Dept. Verbalized acceptance and understanding.  Screening Tests Health Maintenance  Topic Date Due   Zoster Vaccines- Shingrix (1 of 2) Never done   Cervical Cancer Screening (HPV/Pap Cotest)  01/05/2020   Diabetic kidney evaluation - Urine ACR  11/04/2021   COVID-19 Vaccine (3 - 2023-24 season) 07/18/2023   INFLUENZA VACCINE  02/14/2024 (Originally 06/17/2023)   HEMOGLOBIN A1C  09/28/2023   Diabetic kidney evaluation - eGFR measurement  02/22/2024   FOOT EXAM  02/22/2024   Medicare Annual Wellness (AWV)  08/23/2024   MAMMOGRAM  11/19/2024   DTaP/Tdap/Td (3 - Td or Tdap) 06/17/2031   Colonoscopy  07/30/2032   Hepatitis C Screening  Completed   HIV Screening  Completed   HPV VACCINES  Aged Out    Health Maintenance  Health Maintenance Due  Topic Date Due   Zoster Vaccines-  Shingrix (1 of 2) Never done   Cervical Cancer Screening (HPV/Pap Cotest)  01/05/2020   Diabetic kidney evaluation - Urine ACR  11/04/2021   COVID-19 Vaccine (3 - 2023-24 season) 07/18/2023    Colorectal  cancer screening: Type of screening: Colonoscopy. Completed 07/30/22. Repeat every 10 years  Mammogram status: Completed 11/19/22. Repeat every year    Lung Cancer Screening: (Low Dose CT Chest recommended if Age 32-80 years, 20 pack-year currently smoking OR have quit w/in 15years.) does not qualify.    Additional Screening:  Hepatitis C Screening: Completed 01/10/18  Vision Screening: Recommended annual ophthalmology exams for early detection of glaucoma and other disorders of the eye. Is the patient up to date with their annual eye exam?  No  Who is the provider or what is the name of the office in which the patient attends annual eye exams? Pt is blind  If pt is not established with a provider, would they like to be referred to a provider to establish care? No .   Dental Screening: Recommended annual dental exams for proper oral hygiene  Diabetic Foot Exam: Diabetic Foot Exam: Completed 02/22/23  Community Resource Referral / Chronic Care Management: CRR required this visit?  No   CCM required this visit?  No     Plan:     I have personally reviewed and noted the following in the patient's chart:   Medical and social history Use of alcohol, tobacco or illicit drugs  Current medications and supplements including opioid prescriptions. Patient is not currently taking opioid prescriptions. Functional ability and status Nutritional status Physical activity Advanced directives List of other physicians Hospitalizations, surgeries, and ER visits in previous 12 months Vitals Screenings to include cognitive, depression, and falls Referrals and appointments  In addition, I have reviewed and discussed with patient certain preventive protocols, quality metrics, and best practice recommendations. A written personalized care plan for preventive services as well as general preventive health recommendations were provided to patient.     Marzella Schlein, LPN   16/11/958   After  Visit Summary: (MyChart) Due to this being a telephonic visit, the after visit summary with patients personalized plan was offered to patient via MyChart   Nurse Notes: none

## 2023-08-24 NOTE — Patient Instructions (Signed)
Ms. Poinsett , Thank you for taking time to come for your Medicare Wellness Visit. I appreciate your ongoing commitment to your health goals. Please review the following plan we discussed and let me know if I can assist you in the future.   Referrals/Orders/Follow-Ups/Clinician Recommendations: maintain health and activity   This is a list of the screening recommended for you and due dates:  Health Maintenance  Topic Date Due   Zoster (Shingles) Vaccine (1 of 2) Never done   Pap with HPV screening  01/05/2020   Yearly kidney health urinalysis for diabetes  11/04/2021   COVID-19 Vaccine (3 - 2023-24 season) 07/18/2023   Flu Shot  02/14/2024*   Hemoglobin A1C  09/28/2023   Yearly kidney function blood test for diabetes  02/22/2024   Complete foot exam   02/22/2024   Medicare Annual Wellness Visit  08/23/2024   Mammogram  11/19/2024   DTaP/Tdap/Td vaccine (3 - Td or Tdap) 06/17/2031   Colon Cancer Screening  07/30/2032   Hepatitis C Screening  Completed   HIV Screening  Completed   HPV Vaccine  Aged Out  *Topic was postponed. The date shown is not the original due date.    Advanced directives: (Declined) Advance directive discussed with you today. Even though you declined this today, please call our office should you change your mind, and we can give you the proper paperwork for you to fill out.  Next Medicare Annual Wellness Visit scheduled for next year: Yes

## 2023-10-19 ENCOUNTER — Other Ambulatory Visit: Payer: Self-pay | Admitting: Student in an Organized Health Care Education/Training Program

## 2023-10-25 ENCOUNTER — Encounter: Payer: 59 | Admitting: Student in an Organized Health Care Education/Training Program

## 2023-10-25 ENCOUNTER — Ambulatory Visit: Payer: 59

## 2023-11-05 ENCOUNTER — Other Ambulatory Visit: Payer: Self-pay | Admitting: Student in an Organized Health Care Education/Training Program

## 2023-11-15 ENCOUNTER — Telehealth: Payer: Self-pay | Admitting: *Deleted

## 2023-11-15 NOTE — Telephone Encounter (Signed)
Call from patient's niece -patient has had a cough for 3 days.  Did have a fever earlier but none today.  Has been coughing up some blood tinged mucous.  Throw up some blood tinged mucous as well.  States patient is acting the same. Advised to take to an Urgent care as there are no available appointments.  Niece states will take to an Urgent care.

## 2023-12-27 ENCOUNTER — Other Ambulatory Visit: Payer: Self-pay | Admitting: Student in an Organized Health Care Education/Training Program

## 2023-12-27 DIAGNOSIS — Z1231 Encounter for screening mammogram for malignant neoplasm of breast: Secondary | ICD-10-CM

## 2023-12-28 ENCOUNTER — Ambulatory Visit
Admission: RE | Admit: 2023-12-28 | Discharge: 2023-12-28 | Discharge status: Home / Self Care | Payer: 59 | Source: Ambulatory Visit | Attending: Student in an Organized Health Care Education/Training Program

## 2023-12-28 DIAGNOSIS — Z1231 Encounter for screening mammogram for malignant neoplasm of breast: Secondary | ICD-10-CM | POA: Diagnosis not present

## 2023-12-29 ENCOUNTER — Encounter: Payer: 59 | Admitting: Student in an Organized Health Care Education/Training Program

## 2024-01-05 ENCOUNTER — Ambulatory Visit: Payer: 59 | Admitting: Student

## 2024-01-05 VITALS — BP 121/72 | HR 90 | Temp 97.8°F | Ht <= 58 in | Wt 123.0 lb

## 2024-01-05 DIAGNOSIS — R634 Abnormal weight loss: Secondary | ICD-10-CM | POA: Diagnosis not present

## 2024-01-05 DIAGNOSIS — M1000 Idiopathic gout, unspecified site: Secondary | ICD-10-CM | POA: Diagnosis not present

## 2024-01-05 DIAGNOSIS — E785 Hyperlipidemia, unspecified: Secondary | ICD-10-CM | POA: Diagnosis not present

## 2024-01-05 DIAGNOSIS — I10 Essential (primary) hypertension: Secondary | ICD-10-CM | POA: Diagnosis not present

## 2024-01-05 DIAGNOSIS — M109 Gout, unspecified: Secondary | ICD-10-CM

## 2024-01-05 DIAGNOSIS — E1169 Type 2 diabetes mellitus with other specified complication: Secondary | ICD-10-CM

## 2024-01-05 LAB — POCT GLYCOSYLATED HEMOGLOBIN (HGB A1C): Hemoglobin A1C: 6.3 % — AB (ref 4.0–5.6)

## 2024-01-05 LAB — GLUCOSE, CAPILLARY: Glucose-Capillary: 81 mg/dL (ref 70–99)

## 2024-01-05 NOTE — Assessment & Plan Note (Signed)
 Last lipid panel 02/2023 with LDL 104.  Patient continues on pravastatin 20 mg daily.  Will plan to repeat a lipid panel at the next visit. - Continue pravastatin 20 mg daily

## 2024-01-05 NOTE — Patient Instructions (Signed)
  Thank you, [name], for allowing [place] to provide your care today.  I am glad everything is going well and that your blood sugar is doing great off of the metformin.  If this urine test shows that there is an effect from her diabetes and her kidney I will call you and we can discuss starting a medication like [place] or Jardiance.  Otherwise we are going to check her electrolytes, blood count, thyroid function, and uric acid since this has not been checked in a while and make sure there is not another reason other than her diet that she is losing weight.  Whenever I have these results I will give you a call or send you a MyChart message.  If I find out any information on the wheelchair or if there is anything that you will need to do I will call you and let you know.  For her scrape on her head please apply a dry Band-Aid over this until it heals, change the Band-Aid every day or 2.  If you think that she would wear it you can get her a soft helmet or get a sightseeing cane but a regular hat might help as well because the bill with the hat will hit the wall before she does.  We will see you back in about 6 months or sooner if you need anything before then.   I have ordered the following labs for you:   Lab Orders         Microalbumin / Creatinine Urine Ratio         Glucose, capillary         TSH         CBC with Diff         Uric acid         BMP8+Anion Gap         POC Hbg A1C        Follow up: 4-6 months    Remember:     Should you have any questions or concerns please call the internal medicine clinic at [phone].     Rocky Morel, DO [facility]

## 2024-01-05 NOTE — Assessment & Plan Note (Addendum)
 Patient continues to do well on allopurinol 100 mg daily with no gout flares. - Continue allopurinol 100 mg daily and repeat uric acid and CBC today

## 2024-01-05 NOTE — Assessment & Plan Note (Signed)
 Patient has lost about 10 pounds in the last 6 months this seems to be due to change in her diet.  Her sister who she lives with is a diabetic and has been working her diet eating healthier and having less snacks around.  It is reported that the patient continues to eat well and not have any trouble swallowing solids or liquids.  We will further evaluate with some labs today - TSH

## 2024-01-05 NOTE — Progress Notes (Signed)
   CC: Routine Follow Up for hypertension and diabetes after last office visit 06/28/2023  HPI:  Linda Lynn is a 63 y.o. female with pertinent PMH of HTN, T2DM, HLD, gout, and Down syndrome who presents as above. Please see assessment and plan below for further details.  Review of Systems:   Pertinent items noted in HPI and/or A&P.  Physical Exam:  Vitals:   01/05/24 0957  BP: 121/72  Pulse: 90  Temp: 97.8 F (36.6 C)  TempSrc: Oral  SpO2: 94%  Weight: 123 lb (55.8 kg)  Height: 4\' 8"  (1.422 m)    Constitutional: Short stature and chronically ill-appearing female. In no acute distress. HEENT: Abrasion in the midline forehead with healing well-vascularized tissue and no signs of surrounding irritation or infection Cardio:Regular rate and rhythm. 2+ bilateral radial pulses. Pulm:Clear to auscultation bilaterally. Normal work of breathing on room air. ZOX:[id number] for extremity edema.   Assessment & Plan:   Essential hypertension Blood pressure well-controlled today at 121/72.  Chronic and stable condition. - Continue amlodipine-valsartan 5-160 mg daily - BMP today  Type 2 diabetes mellitus with other specified complication (HCC) Since her last visit the patient has stopped metformin due to GI side effects.  A1c today is 6.3 up from 6.0.  She has been doing well on a diet due to her sister having diabetes and working on her diet.  She has lost about 10 pounds in the past 6 months. - Continue off medication for diabetes - Urine microalbumin today, if elevated consider SGLT2 inhibitor  Hyperlipidemia associated with type 2 diabetes mellitus (HCC) Last lipid panel 02/2023 with LDL 104.  Patient continues on pravastatin 20 mg daily.  Will plan to repeat a lipid panel at the next visit. - Continue pravastatin 20 mg daily  Gout Patient continues to do well on allopurinol 100 mg daily with no gout flares. - Continue allopurinol 100 mg daily and repeat uric acid and CBC  today  Weight loss Patient has lost about 10 pounds in the last 6 months this seems to be due to change in her diet.  Her sister who she lives with is a diabetic and has been working her diet eating healthier and having less snacks around.  It is reported that the patient continues to eat well and not have any trouble swallowing solids or liquids.  We will further evaluate with some labs today - TSH    Patient seen with Dr. Julieanne Cotton, DO [facility] Internal [facility] Resident PGY-2 Clinic Phone: [phone] Pager: [phone]

## 2024-01-05 NOTE — Assessment & Plan Note (Signed)
 Since her last visit the patient has stopped metformin due to GI side effects.  A1c today is 6.3 up from 6.0.  She has been doing well on a diet due to her sister having diabetes and working on her diet.  She has lost about 10 pounds in the past 6 months. - Continue off medication for diabetes - Urine microalbumin today, if elevated consider SGLT2 inhibitor

## 2024-01-05 NOTE — Assessment & Plan Note (Signed)
 Blood pressure well-controlled today at 121/72.  Chronic and stable condition. - Continue amlodipine-valsartan 5-160 mg daily - BMP today

## 2024-01-06 LAB — MICROALBUMIN / CREATININE URINE RATIO
Creatinine, Urine: 99.7 mg/dL
Microalb/Creat Ratio: 31 mg/g{creat} — ABNORMAL HIGH (ref 0–29)
Microalbumin, Urine: 31.4 ug/mL

## 2024-01-06 LAB — CBC WITH DIFFERENTIAL/PLATELET
Basophils Absolute: 0.1 10*3/uL (ref 0.0–0.2)
Basos: 1 %
EOS (ABSOLUTE): 0 10*3/uL (ref 0.0–0.4)
Eos: 0 %
Hematocrit: 40.1 % (ref 34.0–46.6)
Hemoglobin: 12.5 g/dL (ref 11.1–15.9)
Immature Grans (Abs): 0 10*3/uL (ref 0.0–0.1)
Immature Granulocytes: 0 %
Lymphocytes Absolute: 4.7 10*3/uL — ABNORMAL HIGH (ref 0.7–3.1)
Lymphs: 65 %
MCH: 28.8 pg (ref 26.6–33.0)
MCHC: 31.2 g/dL — ABNORMAL LOW (ref 31.5–35.7)
MCV: 92 fL (ref 79–97)
Monocytes Absolute: 0.3 10*3/uL (ref 0.1–0.9)
Monocytes: 4 %
Neutrophils Absolute: 2.2 10*3/uL (ref 1.4–7.0)
Neutrophils: 30 %
Platelets: 219 10*3/uL (ref 150–450)
RBC: 4.34 x10E6/uL (ref 3.77–5.28)
RDW: 14.6 % (ref 11.7–15.4)
WBC: 7.3 10*3/uL (ref 3.4–10.8)

## 2024-01-06 LAB — BMP8+ANION GAP
Anion Gap: 13 mmol/L (ref 10.0–18.0)
BUN/Creatinine Ratio: 15 (ref 12–28)
BUN: 17 mg/dL (ref 8–27)
CO2: 25 mmol/L (ref 20–29)
Calcium: 9.2 mg/dL (ref 8.7–10.3)
Chloride: 104 mmol/L (ref 96–106)
Creatinine, Ser: 1.14 mg/dL — ABNORMAL HIGH (ref 0.57–1.00)
Glucose: 109 mg/dL — ABNORMAL HIGH (ref 70–99)
Potassium: 4.2 mmol/L (ref 3.5–5.2)
Sodium: 142 mmol/L (ref 134–144)
eGFR: 54 mL/min/{1.73_m2} — ABNORMAL LOW (ref >59)

## 2024-01-06 LAB — TSH: TSH: 0.649 u[IU]/mL (ref 0.450–4.500)

## 2024-01-06 LAB — URIC ACID: Uric Acid: 4.3 mg/dL (ref 3.0–7.2)

## 2024-01-07 NOTE — Progress Notes (Signed)
 Internal Medicine Clinic Attending  I was physically present during the key portions of the resident provided service and participated in the medical decision making of patient's management care. I reviewed pertinent patient test results.  The assessment, diagnosis, and plan were formulated together and I agree with the documentation in the resident's note.  Erlinda Hong, MD FACP

## 2024-01-13 NOTE — Progress Notes (Signed)
 Called and discussed stable/normal results of BMP, CBC, TSH, and uric acid with the patient's niece Magdalene Patricia who was present during her clinic visit.  Microalbumin to creatinine ratio is slightly elevated at 31 in the moderate range.  She is already on valsartan 160 mg daily with well-controlled blood pressure and would be at risk for hypotension if this was increased.  Discussed SGLT2 inhibitors with the risk of mycotic GU infections and due to the patient's mental status there is a risk for having infection and not being able to tell someone about it.  Due to the mild elevation of protein we will hold off on additional treatment and continue to monitor.

## 2024-06-12 ENCOUNTER — Encounter: Payer: Self-pay | Admitting: Pharmacist

## 2024-06-12 NOTE — Progress Notes (Signed)
 Pharmacy Quality Measure Review  This patient is appearing on a report for being at risk of failing the adherence measure for hypertension (ACEi/ARB) medications this calendar year.   Medication: amlodipine  / valsartan  5/160mg  Last fill date: 01/13/2024 for 90 day supply per adherence report  Reviewed recent refill history in Dr Annemarie database. Actual last refill date was 06/01/2024 for 90 day supply. Patient has no refills remaining. Next appointment with PCP is not currently scheduled.    Insurance report was not up to date. No action needed at this time regarding refills but patient will need an updated prescription prior to her next refill.  Previously was prescribed by Dr Jerrell but does not appear that patient is following Dr Jerrell to [facility] office. Tried to call patient's guardian to confirm this and recommend follow up appointment in the next 3 months with whichever provider they choose for PCP. Unable to reach caregiver and not able to LM on VM. Will sent MyChart Message.   Madelin Ray, PharmD Clinical Pharmacist [facility]  Population Health [phone]

## 2024-07-21 ENCOUNTER — Telehealth: Payer: Self-pay

## 2024-07-21 NOTE — Telephone Encounter (Signed)
 Type of form received: Lobbyist (as prescription) from [facility]  Additional comments: Pt has not est care at [facility] but has seen Dr. Jerrell in the past, Last OV 06/28/23  Received by: Fax  Form should be Faxed/mailed to: (address/ fax #) [phone]  Is patient requesting call for pickup: N/A  Form placed:  Labeled & placed in provider bin  Attach charge sheet.  Provider will determine charge.  Individual made aware of 3-5 business day turn around? N/A

## 2024-07-21 NOTE — Telephone Encounter (Signed)
 Did let Linda Lynn know patient needs an appointment in order to have these forms completed due to patient has not been seen by Dr.Vincent since 06/2023 and he has moved to a new location since. Also noticed on the forms they sent had patients birthday incorrect so Linda Lynn is sending forms back to be corrected.

## 2024-07-25 ENCOUNTER — Ambulatory Visit (INDEPENDENT_AMBULATORY_CARE_PROVIDER_SITE_OTHER): Admitting: Student

## 2024-07-25 ENCOUNTER — Encounter: Payer: Self-pay | Admitting: Student

## 2024-07-25 VITALS — BP 124/83 | HR 62 | Temp 97.4°F | Ht <= 58 in | Wt 110.0 lb

## 2024-07-25 DIAGNOSIS — Z833 Family history of diabetes mellitus: Secondary | ICD-10-CM

## 2024-07-25 DIAGNOSIS — E1169 Type 2 diabetes mellitus with other specified complication: Secondary | ICD-10-CM | POA: Diagnosis not present

## 2024-07-25 DIAGNOSIS — R011 Cardiac murmur, unspecified: Secondary | ICD-10-CM | POA: Diagnosis not present

## 2024-07-25 DIAGNOSIS — Z23 Encounter for immunization: Secondary | ICD-10-CM | POA: Diagnosis not present

## 2024-07-25 DIAGNOSIS — M109 Gout, unspecified: Secondary | ICD-10-CM

## 2024-07-25 DIAGNOSIS — Z7409 Other reduced mobility: Secondary | ICD-10-CM

## 2024-07-25 DIAGNOSIS — Z8249 Family history of ischemic heart disease and other diseases of the circulatory system: Secondary | ICD-10-CM

## 2024-07-25 DIAGNOSIS — R001 Bradycardia, unspecified: Secondary | ICD-10-CM

## 2024-07-25 DIAGNOSIS — G4700 Insomnia, unspecified: Secondary | ICD-10-CM

## 2024-07-25 DIAGNOSIS — I10 Essential (primary) hypertension: Secondary | ICD-10-CM | POA: Diagnosis not present

## 2024-07-25 DIAGNOSIS — M1000 Idiopathic gout, unspecified site: Secondary | ICD-10-CM | POA: Diagnosis not present

## 2024-07-25 DIAGNOSIS — H547 Unspecified visual loss: Secondary | ICD-10-CM

## 2024-07-25 DIAGNOSIS — Q909 Down syndrome, unspecified: Secondary | ICD-10-CM

## 2024-07-25 DIAGNOSIS — F5101 Primary insomnia: Secondary | ICD-10-CM

## 2024-07-25 DIAGNOSIS — Z79899 Other long term (current) drug therapy: Secondary | ICD-10-CM

## 2024-07-25 DIAGNOSIS — Z0000 Encounter for general adult medical examination without abnormal findings: Secondary | ICD-10-CM

## 2024-07-25 LAB — POCT GLYCOSYLATED HEMOGLOBIN (HGB A1C): HbA1c, POC (controlled diabetic range): 6.3 % (ref 0.0–7.0)

## 2024-07-25 LAB — GLUCOSE, CAPILLARY: Glucose-Capillary: 94 mg/dL (ref 70–99)

## 2024-07-25 MED ORDER — AMLODIPINE BESYLATE-VALSARTAN 5-160 MG PO TABS: 1.0000 | ORAL_TABLET | Freq: Every day | ORAL | 3 refills | Status: AC

## 2024-07-25 MED ORDER — TRAZODONE HCL 50 MG PO TABS: 50.0000 mg | ORAL_TABLET | Freq: Every day | ORAL | 11 refills | Status: DC

## 2024-07-25 MED ORDER — ALLOPURINOL 100 MG PO TABS: 100.0000 mg | ORAL_TABLET | Freq: Every day | ORAL | 3 refills | Status: AC

## 2024-07-25 NOTE — Assessment & Plan Note (Signed)
 Previously well-controlled with phenobarbital at 100 daily with no recent gout flares.  Feet are usually affected by patient's worsening pain associated with worsening ability to ambulate. Initially I had sent at uric acid levels to recheck her levels at this did not make it through I will check out.  For now, we will continue Allopurinol  100 mg daily. - Continue to follow

## 2024-07-25 NOTE — Assessment & Plan Note (Signed)
 Uncontrolled today.  Has been out of medications for some time with transitioning of care. - bmp today - Resume amlodipine -valsartan  5-160 mg

## 2024-07-25 NOTE — Progress Notes (Signed)
 Subjective:  CC: chronic condition follow up  HPI:  [name] is a 63 y.o. female with a past medical history stated below and presents today for chronic condition follow-up after the departure of her prior PCP, Linda Lynn.  Of note, Linda Lynn has a history of Linda Lynn, she is not very interactive with medical personnel but she is accompanied by her niece, Linda Lynn.Please see problem based assessment and plan for additional details.  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     pravastatin  (PRAVACHOL ) 20 MG tablet TAKE 1 TABLET BY MOUTH EVERY DAY 90 tablet 3   No current facility-administered medications on file prior to visit.    Family History  Problem Relation Age of Onset   Diabetes Sister    Hypertension Sister    Esophageal cancer Sister    Diabetes Sister    Hypertension Sister    Colon cancer Cousin    Colon polyps Neg Hx    Rectal cancer Neg Hx    Stomach cancer Neg Hx     Social History   Socioeconomic History   Marital status: Single    Spouse name: Not on file   Number of children: Not on file   Years of education: Not on file   Highest education level: Not on file  Occupational History   Not on file  Tobacco Use   Smoking status: Never   Smokeless tobacco: Never  Substance and Sexual Activity   Alcohol use: No    Alcohol/week: 0.0 standard drinks of alcohol   Drug use: No   Sexual activity: Not on file  Other Topics Concern   Not on file  Social History Narrative   Not on file   Social Drivers of Health   Financial Resource Strain: Low Risk  (08/24/2023)   Overall Financial Resource Strain (CARDIA)     Difficulty of Paying Living Expenses: Not hard at all  Food Insecurity: No Food Insecurity (08/24/2023)   Hunger Vital Sign    Worried About Running Out of Food in the Last Year: Never true    Ran Out of Food in the Last Year: Never true  Transportation Needs: No Transportation Needs (08/24/2023)   PRAPARE - Administrator, Civil Service (Medical): No    Lack of Transportation (Non-Medical): No  Physical Activity: Inactive (08/24/2023)   Exercise Vital Sign    Days of Exercise per Week: 0 days    Minutes of Exercise per Session: 0 min  Stress: No Stress Concern Present (08/24/2023)   [employer] of Occupational Health - Occupational Stress Questionnaire    Feeling of Stress : Not at all  Social Connections: Socially Isolated (08/24/2023)   Social Connection and Isolation Panel    Frequency of Communication with Friends and Family: Once a week    Frequency of Social Gatherings with Friends and Family: Once a week    Attends Religious Services: Never    Database administrator or Organizations: No    Attends Banker Meetings: Never    Marital Status: Never married  Intimate  Partner Violence: Not At Risk (08/24/2023)   Humiliation, Afraid, Rape, and Kick questionnaire    Fear of Current or Ex-Partner: No    Emotionally Abused: No    Physically Abused: No    Sexually Abused: No    Review of Systems: ROS negative except for what is noted on the assessment and plan.  Objective:   Vitals:   07/25/24 0924 07/25/24 1033  BP: (!) 154/89 124/83  Pulse: (!) 58 62  Temp: (!) 97.4 F (36.3 C)   TempSrc: Oral   SpO2: 97%   Weight: 110 lb (49.9 kg)   Height: 4' 8 (1.422 m)     Physical Exam: Constitutional: well-appearing woman sitting in chair, in no acute distress HENT: normocephalic atraumatic, mucous membranes moist Eyes: conjunctiva non-erythematous Neck: supple Cardiovascular: r bradycardic with systolic murmur appreciated at the left upper sternal  border Pulmonary/Chest: normal work of breathing on room air, lungs clear to auscultation bilaterally Abdominal: soft, non-tender, non-distended MSK: normal bulk and tone Neurological: alert minimally interactive.  Does not respond to yes/no questions.  Grimaces with discomfort.  Talks to herself in the room without coherent speech.  This is her baseline Skin: warm and dry Psych: Pleasant mood .-      08/24/2023    3:14 PM  Depression screen PHQ 2/9  Decreased Interest 0  Linda, Depressed, Hopeless 0  PHQ - 2 Score 0        No data to display           Assessment & Plan:   Essential hypertension Uncontrolled today.  Has been out of medications for some time with transitioning of care. - bmp today - Resume amlodipine -valsartan  5-160 mg   Type 2 diabetes mellitus with other specified complication (HCC) Well-controlled with no medications. Lab Results  Component Value Date   HGBA1C 6.3 07/25/2024   HGBA1C 6.3 (A) 01/05/2024   HGBA1C 6.0 (A) 06/28/2023  Has a good appetite no changes in her diet.  No major changes in her weight.  No signs of proteinuria in the past; unable to obtain urine today as she is not able to urinate on command.  Will forego given low likelihood of proteinuria today. - Continue monitoring every 3 to 6 months - Foot exam today; within normal limits, except for monofilament test.  Patient unable to tell me where she felt monofilament testing.   Gout Previously well-controlled with phenobarbital at 100 daily with no recent gout flares.  Feet are usually affected by patient's worsening pain associated with worsening ability to ambulate. Initially I had sent at uric acid levels to recheck her levels at this did not make it through I will check out.  For now, we will continue Allopurinol  100 mg daily. - Continue to follow  Systolic murmur Appreciated on exam today.  I see that these has been noted in the past last while.  No 2D echocardiogram on file.  Given  history of Linda's and associated structural heart abnormalities, ordered 2D echocardiogram today.  Discussed with family and they think that she will be able to do so with instructions and assistance of family member  Linda's Lynn Dependent on family members for most ADLs.  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  Continues to be unable to ambulate short distances at home without assistance.  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  Add cane/walker/crutch has not resolved the issue with performing activities of daily.  She is also going and is dependent on her caregivers, 7 point have chronic conditions up.  No and I do not able to maneuver heavy wheelchair.  Patient herself also has arm weakness and lack of endurance.  At light wheelchair would allow patient to safely add outside the house with minimal assistance from her caregivers.  Patient will be able to self propel by wheelchair with close assistance. -Referral to DME for light wheelchair sent  Healthcare maintenance Received flu vaccination today   Insomnia Difficulty with wake/sleep cycles.  Patient wakes up in the middle of the night and wanders with the home that she shares with her elderly sister.  Sleeps throughout the day at times and is unable to fall asleep again at night.  Family has tried trazodone , multiple p.m. medications to help with this and all of them have been effective.  Patient does snore and could have sleep apnea.  However, family members did not think that she could withstand at home or  facility polysomnography.  As such we will try the following: - Trazodone  50 mg -Telehealth appointment with patient's niece in about 2 to 4 weeks to assess medication side effects    Return in about 4 weeks (around 08/22/2024) for Telehealth to speak about insomnia.   Hadassah Kristy Ahr, MD [facility] Internal Medicine Residency Program  07/25/2024, 1:32 PM

## 2024-07-25 NOTE — Assessment & Plan Note (Signed)
 Dependent on family members for most ADLs.  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  Continues to be unable to ambulate short distances at home without assistance.  - Will screen today for hematologic disorders - Baseline TTE, ordered - Reviewed recent TSH, within normal limits -Family agrees in full scope of care so long as patient is able to sleep through testing

## 2024-07-25 NOTE — Assessment & Plan Note (Signed)
 Appreciated on exam today.  I see that these has been noted in the past last while.  No 2D echocardiogram on file.  Given history of Down's and associated structural heart abnormalities, ordered 2D echocardiogram today.  Discussed with family and they think that she will be able to do so with instructions and assistance of family member

## 2024-07-25 NOTE — Patient Instructions (Addendum)
 Thank you, Ms.Jon Elbe for allowing us  to provide your care today. Today we discussed   Insominia - starting trazodone  50 mg at bedtime - 2 hours before you want her to  go to bed Sleep patterns - loud. Active engagement during the day. Mellow music and darkness at night  Blood pressure and gout: checking labs and restarting medications  Check her blood for anemia or any other abnormalities  Sending her to get a echocardigram, which is an ultrasound of the heart  Applying for her to get her wheelchair for mobility outside of the home     She also got her flut shot todayLab Orders         Glucose, capillary         Basic metabolic panel with GFR         Microalbumin / creatinine urine ratio         CBC with Diff         POC Hbg A1C      I will call if any are abnormal. All of your labs can be accessed through My Chart.   My Chart Access: [email or web address]  Please follow-up in: 3 months    We look forward to seeing you next time. Please call our clinic at [phone] if you have any questions or concerns. The best time to call is Monday-Friday from 9am-4pm, but there is someone available 24/7. If after hours or the weekend, call the main hospital number and ask for the Internal Medicine Resident On-Call. If you need medication refills, please notify your pharmacy one week in advance and they will send us  a request.   Thank you for letting us  take part in your care. Wishing you the best!  Elnora Ip, MD 07/25/2024, 10:15 AM [facility]

## 2024-07-25 NOTE — Assessment & Plan Note (Addendum)
 Well-controlled with no medications. Lab Results  Component Value Date   HGBA1C 6.3 07/25/2024   HGBA1C 6.3 (A) 01/05/2024   HGBA1C 6.0 (A) 06/28/2023  Has a good appetite no changes in her diet.  No major changes in her weight.  No signs of proteinuria in the past; unable to obtain urine today as she is not able to urinate on command.  Will forego given low likelihood of proteinuria today. - Continue monitoring every 3 to 6 months - Foot exam today; within normal limits, except for monofilament test.  Patient unable to tell me where she felt monofilament testing.

## 2024-07-25 NOTE — Assessment & Plan Note (Signed)
 Difficulty with wake/sleep cycles.  Patient wakes up in the middle of the night and wanders with the home that she shares with her elderly sister.  Sleeps throughout the day at times and is unable to fall asleep again at night.  Family has tried trazodone , multiple p.m. medications to help with this and all of them have been effective.  Patient does snore and could have sleep apnea.  However, family members did not think that she could withstand at home or facility polysomnography.  As such we will try the following: - Trazodone  50 mg -Telehealth appointment with patient's niece in about 2 to 4 weeks to assess medication side effects

## 2024-07-25 NOTE — Assessment & Plan Note (Signed)
 This patient is And unstable on her feet, which impairs their ability to perform activities of daily living by toileting, feeding, dressing, grooming, bathing in the home.  Add cane/walker/crutch has not resolved the issue with performing activities of daily.  She is also going and is dependent on her caregivers, 7 point have chronic conditions up.  No and I do not able to maneuver heavy wheelchair.  Patient herself also has arm weakness and lack of endurance.  At light wheelchair would allow patient to safely add outside the house with minimal assistance from her caregivers.  Patient will be able to self propel by wheelchair with close assistance. -Referral to DME for light wheelchair sent

## 2024-07-25 NOTE — Assessment & Plan Note (Signed)
Received flu vaccination today.  

## 2024-07-25 NOTE — Assessment & Plan Note (Signed)
 Stable

## 2024-07-26 LAB — BASIC METABOLIC PANEL WITH GFR
BUN/Creatinine Ratio: 19 (ref 12–28)
BUN: 20 mg/dL (ref 8–27)
CO2: 25 mmol/L (ref 20–29)
Calcium: 9.1 mg/dL (ref 8.7–10.3)
Chloride: 99 mmol/L (ref 96–106)
Creatinine, Ser: 1.03 mg/dL — ABNORMAL HIGH (ref 0.57–1.00)
Glucose: 102 mg/dL — ABNORMAL HIGH (ref 70–99)
Potassium: 4.8 mmol/L (ref 3.5–5.2)
Sodium: 137 mmol/L (ref 134–144)
eGFR: 61 mL/min/1.73 (ref >59)

## 2024-07-26 LAB — CBC WITH DIFFERENTIAL/PLATELET
Basophils Absolute: 0.1 x10E3/uL (ref 0.0–0.2)
Basos: 1 %
EOS (ABSOLUTE): 0 x10E3/uL (ref 0.0–0.4)
Eos: 1 %
Hematocrit: 37.8 % (ref 34.0–46.6)
Hemoglobin: 12.1 g/dL (ref 11.1–15.9)
Immature Grans (Abs): 0 x10E3/uL (ref 0.0–0.1)
Immature Granulocytes: 0 %
Lymphocytes Absolute: 2.9 x10E3/uL (ref 0.7–3.1)
Lymphs: 48 %
MCH: 29.9 pg (ref 26.6–33.0)
MCHC: 32 g/dL (ref 31.5–35.7)
MCV: 93 fL (ref 79–97)
Monocytes Absolute: 0.3 x10E3/uL (ref 0.1–0.9)
Monocytes: 5 %
Neutrophils Absolute: 2.6 x10E3/uL (ref 1.4–7.0)
Neutrophils: 45 %
Platelets: 206 x10E3/uL (ref 150–450)
RBC: 4.05 x10E6/uL (ref 3.77–5.28)
RDW: 14.7 % (ref 11.7–15.4)
WBC: 5.9 x10E3/uL (ref 3.4–10.8)

## 2024-07-26 NOTE — Progress Notes (Signed)
 Internal Medicine Clinic Attending  Case discussed with the resident at the time of the visit.  We reviewed the resident's history and exam and pertinent patient test results.  I agree with the assessment, diagnosis, and plan of care documented in the resident's note.

## 2024-08-08 ENCOUNTER — Ambulatory Visit: Payer: Self-pay | Admitting: Student

## 2024-08-08 NOTE — Progress Notes (Signed)
 Discussed with her niece, Collier. Stable labs and no changes. She was able to schedule TTE, coming up in October 2025. The Trazodone  has working. Patient' s been sleeping from 10 PM to 5 AM. No medication side effects identified.

## 2024-09-06 ENCOUNTER — Ambulatory Visit: Payer: 59

## 2024-09-06 ENCOUNTER — Ambulatory Visit (HOSPITAL_COMMUNITY)
Admission: RE | Admit: 2024-09-06 | Discharge: 2024-09-06 | Disposition: A | Discharge status: Home / Self Care | Source: Ambulatory Visit | Attending: Internal Medicine | Admitting: Internal Medicine

## 2024-09-06 VITALS — Ht <= 58 in | Wt 130.0 lb

## 2024-09-06 DIAGNOSIS — I351 Nonrheumatic aortic (valve) insufficiency: Secondary | ICD-10-CM

## 2024-09-06 DIAGNOSIS — Q909 Down syndrome, unspecified: Secondary | ICD-10-CM | POA: Insufficient documentation

## 2024-09-06 DIAGNOSIS — Z0000 Encounter for general adult medical examination without abnormal findings: Secondary | ICD-10-CM

## 2024-09-06 DIAGNOSIS — I350 Nonrheumatic aortic (valve) stenosis: Secondary | ICD-10-CM | POA: Diagnosis not present

## 2024-09-06 DIAGNOSIS — I352 Nonrheumatic aortic (valve) stenosis with insufficiency: Secondary | ICD-10-CM | POA: Insufficient documentation

## 2024-09-06 LAB — ECHOCARDIOGRAM COMPLETE
AR max vel: 1.73 cm2
AV Area VTI: 1.8 cm2
AV Area mean vel: 1.85 cm2
AV Mean grad: 8 mmHg
AV Peak grad: 16.5 mmHg
Ao pk vel: 2.03 m/s
Area-P 1/2: 6.02 cm2
Calc EF: 70 %
S' Lateral: 2.2 cm
Single Plane A2C EF: 70.4 %
Single Plane A4C EF: 71.5 %

## 2024-09-06 NOTE — Progress Notes (Signed)
 Because this visit was a virtual/telehealth visit,  certain criteria was not obtained, such a blood pressure, CBG if applicable, and timed get up and go. Any medications not marked as taking were not mentioned during the medication reconciliation part of the visit. Any vitals not documented were not able to be obtained due to this being a telehealth visit or patient was unable to self-report a recent blood pressure reading due to a lack of equipment at home via telehealth. Vitals that have been documented are verbally provided by the patient.   Subjective:   Linda Lynn is a 63 y.o. who presents for a Medicare Wellness preventive visit.  As a reminder, Annual Wellness Visits don't include a physical exam, and some assessments may be limited, especially if this visit is performed virtually. We may recommend an in-person follow-up visit with your provider if needed.  Visit Complete: Virtual I connected with  Jon Elbe on 09/06/24 by a audio enabled telemedicine application and verified that I am speaking with the correct person using two identifiers.  Patient Location: Home  Provider Location: Home Office  I discussed the limitations of evaluation and management by telemedicine. The patient expressed understanding and agreed to proceed.  Vital Signs: Because this visit was a virtual/telehealth visit, some criteria may be missing or patient reported. Any vitals not documented were not able to be obtained and vitals that have been documented are patient reported.  VideoDeclined- This patient declined Librarian, academic. Therefore the visit was completed with audio only.  Persons Participating in Visit: Naomie Librarian, academic) and patient was present during visit.  AWV Questionnaire: No: Patient Medicare AWV questionnaire was not completed prior to this visit.  Cardiac Risk Factors include: diabetes mellitus;dyslipidemia;family history of premature  cardiovascular disease;hypertension     Objective:    Today's Vitals   09/06/24 1306  Weight: 130 lb (59 kg)  Height: 4' 8 (1.422 m)  PainSc: 0-No pain   Body mass index is 29.15 kg/m.     09/06/2024    1:31 PM 08/24/2023    3:16 PM 07/26/2023   10:17 AM 03/20/2022   12:23 PM 09/29/2021    4:42 PM 09/24/2021    3:40 PM 09/09/2021    5:04 PM  Advanced Directives  Does Patient Have a Medical Advance Directive? No No No Yes No No No  Would patient like information on creating a medical advance directive? No - Patient declined No - Patient declined   No - Patient declined No - Patient declined No - Guardian declined    Current Medications (verified) Outpatient Encounter Medications as of 09/06/2024  Medication Sig   allopurinol  (ZYLOPRIM ) 100 MG tablet Take 1 tablet (100 mg total) by mouth daily.   amLODipine -valsartan  (EXFORGE ) 5-160 MG tablet Take 1 tablet by mouth daily.   Multiple Vitamins-Minerals (VITAMIN D3 COMPLETE PO) Take by mouth.   pravastatin  (PRAVACHOL ) 20 MG tablet TAKE 1 TABLET BY MOUTH EVERY DAY   traZODone  (DESYREL ) 50 MG tablet Take 1 tablet (50 mg total) by mouth at bedtime.   No facility-administered encounter medications on file as of 09/06/2024.    Allergies (verified) Patient has no known allergies.   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   Past Surgical History:  Procedure Laterality Date   COLONOSCOPY  2013   EYE SURGERY  11/16/2005   right   Family History  Problem Relation Age of Onset   Diabetes Sister    Hypertension Sister    Esophageal cancer Sister    Diabetes Sister    Hypertension Sister    Colon cancer Cousin    Colon polyps Neg Hx    Rectal cancer Neg Hx    Stomach cancer Neg Hx    Social History   Socioeconomic History   Marital status: Single     Spouse name: Not on file   Number of children: Not on file   Years of education: Not on file   Highest education level: Not on file  Occupational History   Not on file  Tobacco Use   Smoking status: Never   Smokeless tobacco: Never  Substance and Sexual Activity   Alcohol use: No    Alcohol/week: 0.0 standard drinks of alcohol   Drug use: No   Sexual activity: Not on file  Other Topics Concern   Not on file  Social History Narrative   Not on file   Social Drivers of Health   Financial Resource Strain: Low Risk  (09/06/2024)   Overall Financial Resource Strain (CARDIA)    Difficulty of Paying Living Expenses: Not hard at all  Food Insecurity: No Food Insecurity (09/06/2024)   Hunger Vital Sign    Worried About Running Out of Food in the Last Year: Never true    Ran Out of Food in the Last Year: Never true  Transportation Needs: No Transportation Needs (09/06/2024)   PRAPARE - Administrator, Civil Service (Medical): No    Lack of Transportation (Non-Medical): No  Physical Activity: Inactive (09/06/2024)   Exercise Vital Sign    Days of Exercise per Week: 0 days    Minutes of Exercise per Session: 0 min  Stress: No Stress Concern Present (09/06/2024)   [employer] of Occupational Health - Occupational Stress Questionnaire    Feeling of Stress: Not at all  Social Connections: Socially Isolated (09/06/2024)   Social Connection and Isolation Panel    Frequency of Communication with Friends and Family: Once a week    Frequency of Social Gatherings with Friends and Family: Once a week    Attends Religious Services: Never    Database administrator or Organizations: No    Attends Engineer, structural: Never    Marital Status: Never married    Tobacco Counseling Counseling given: Not Answered    Clinical Intake:  Pre-visit preparation completed: Yes  Pain : No/denies pain Pain Score: 0-No pain     BMI - recorded: 29.15 Nutritional  Status: BMI 25 -29 Overweight Nutritional Risks: None Diabetes: Yes CBG done?: No Did pt. bring in CBG monitor from home?: No  Lab Results  Component Value Date   HGBA1C 6.3 07/25/2024   HGBA1C 6.3 (A) 01/05/2024   HGBA1C 6.0 (A) 06/28/2023     How often do you need to have someone help you when you read instructions, pamphlets, or other written materials from your doctor or pharmacy?: 1 - Never What is the last grade level you completed in school?: N/A  Interpreter Needed?: No  Information entered by :: Caulder Wehner N. Zahniya Zellars, LPN.   Activities of Daily Living     09/06/2024    1:10 PM  In your present state of health, do you have any difficulty performing the following activities:  Hearing? 1  Comment HARD OF HEARING, NO HEARING AIDS  Vision? 1  Comment BLINDNESS  Difficulty concentrating or making decisions? 1  Comment DOWN'S SYNDROME  Walking or climbing stairs? 1  Comment WHEELCHAIR  Dressing or bathing? 1  Doing errands, shopping? 1  Preparing Food and eating ? Y  Using the Toilet? Y  In the past six months, have you accidently leaked urine? Y  Comment WEARS ADULT DIAPERS  Do you have problems with loss of bowel control? Y  Comment WEARS ADULT DIAPERS  Managing your Medications? Y  Managing your Finances? Y  Housekeeping or managing your Housekeeping? Y    Patient Care Team: Octavia Charleston, MD as Consulting Physician (Ophthalmology)  I have updated your Care Teams any recent Medical Services you may have received from other providers in the past year.     Assessment:   This is a routine wellness examination for Linda Lynn.  Hearing/Vision screen Hearing Screening - Comments:: Per Caregiver: Patient is hard of hearing. Vision Screening - Comments:: Per caregiver: Patient is blind in both eyes.   Goals Addressed             This Visit's Progress    09/06/2024: To continue to maintain.         Depression Screen     09/06/2024    1:11 PM 08/24/2023     3:14 PM 02/22/2023    9:26 AM 09/29/2021    4:43 PM 09/24/2021    4:12 PM 09/09/2021    5:04 PM 06/16/2021    3:17 PM  PHQ 2/9 Scores  PHQ - 2 Score  0 0 0 0 0 0  PHQ- 9 Score       0  Exception Documentation Other- indicate reason in comment box   Medical reason Other- indicate reason in comment box    Not completed PATIENT UNABLE TO RESPOND.          Fall Risk     09/06/2024    1:10 PM 08/24/2023    3:17 PM 02/22/2023    9:25 AM 09/29/2021    4:42 PM 09/24/2021    4:11 PM  Fall Risk   Falls in the past year? 1 0 0 0 0  Number falls in past yr: 1 0 0 0 0  Injury with Fall? 1 0 0 0 0  Risk for fall due to : History of fall(s);Impaired balance/gait;Orthopedic patient Impaired vision  Impaired vision Impaired vision  Follow up Falls evaluation completed;Education provided Falls prevention discussed Falls evaluation completed Falls evaluation completed  Falls evaluation completed      Data saved with a previous flowsheet row definition    MEDICARE RISK AT HOME:  Medicare Risk at Home Any stairs in or around the home?: No If so, are there any without handrails?: No Home free of loose throw rugs in walkways, pet beds, electrical cords, etc?: Yes Adequate lighting in your home to reduce risk of falls?: Yes Life alert?: No Use of a cane, walker or w/c?: Yes Grab bars in the bathroom?: Yes Shower chair or bench in shower?: Yes Elevated toilet seat or a handicapped toilet?: Yes  TIMED UP AND GO:  Was the test performed?  No  Cognitive Function: Impaired: Patient has current diagnosis of cognitive impairment.    09/06/2024    1:11 PM  MMSE - Mini Mental State Exam  Not completed: Unable to complete        Immunizations Immunization History  Administered Date(s) Administered  Influenza Split 08/06/2011   Influenza Whole 08/31/2007   Influenza, Seasonal, Injecte, Preservative Fre 07/25/2024   Influenza,inj,Quad PF,6+ Mos 07/24/2014, 12/30/2015, 07/28/2016, 01/10/2018,  10/03/2018, 08/10/2019, 11/04/2020, 09/24/2021   MMR 01/26/1971   PFIZER(Purple Top)SARS-COV-2 Vaccination 03/25/2020, 04/13/2020   PNEUMOCOCCAL CONJUGATE-20 07/25/2024   Pneumococcal Polysaccharide-23 02/28/2013, 04/18/2018   Polio, Unspecified 12/09/1973   Td 10/27/2010   Tdap 06/16/2021   Tetanus 12/09/1973    Screening Tests Health Maintenance  Topic Date Due   Zoster Vaccines- Shingrix (1 of 2) Never done   COVID-19 Vaccine (3 - Pfizer risk series) 05/11/2020   HEMOGLOBIN A1C  10/24/2024   Diabetic kidney evaluation - Urine ACR  01/04/2025   Diabetic kidney evaluation - eGFR measurement  07/25/2025   FOOT EXAM  07/25/2025   Medicare Annual Wellness (AWV)  09/06/2025   Mammogram  12/27/2025   DTaP/Tdap/Td (4 - Td or Tdap) 06/17/2031   Colonoscopy  07/30/2032   Pneumococcal Vaccine: 50+ Years  Completed   Influenza Vaccine  Completed   Hepatitis C Screening  Completed   HIV Screening  Completed   Hepatitis B Vaccines 19-59 Average Risk  Aged Out   HPV VACCINES  Aged Out   Meningococcal B Vaccine  Aged Out   OPHTHALMOLOGY EXAM  Discontinued    Health Maintenance Items Addressed: Vaccines Due: Shingrix and Covid  Additional Screening:  Vision Screening: Recommended annual ophthalmology exams for early detection of glaucoma and other disorders of the eye. Is the patient up to date with their annual eye exam?  No  Who is the provider or what is the name of the office in which the patient attends annual eye exams? [facility]  Dental Screening: Recommended annual dental exams for proper oral hygiene  Community Resource Referral / Chronic Care Management: CRR required this visit?  No   CCM required this visit?  No   Plan:    I have personally reviewed and noted the following in the patient's chart:   Medical and social history Use of alcohol, tobacco or illicit drugs  Current medications and supplements including opioid prescriptions. Patient is not currently  taking opioid prescriptions. Functional ability and status Nutritional status Physical activity Advanced directives List of other physicians Hospitalizations, surgeries, and ER visits in previous 12 months Vitals Screenings to include cognitive, depression, and falls Referrals and appointments  In addition, I have reviewed and discussed with patient certain preventive protocols, quality metrics, and best practice recommendations. A written personalized care plan for preventive services as well as general preventive health recommendations were provided to patient.   Roz LOISE Fuller, LPN   [id number]   After Visit Summary: (MyChart) Due to this being a telephonic visit, the after visit summary with patients personalized plan was offered to patient via MyChart   Notes: Nothing significant to report at this time.

## 2024-09-06 NOTE — Patient Instructions (Signed)
 Linda Lynn,  Thank you for taking the time for your Medicare Wellness Visit. I appreciate your continued commitment to your health goals. Please review the care plan we discussed, and feel free to reach out if I can assist you further.  Medicare recommends these wellness visits once per year to help you and your care team stay ahead of potential health issues. These visits are designed to focus on prevention, allowing your provider to concentrate on managing your acute and chronic conditions during your regular appointments.  Please note that Annual Wellness Visits do not include a physical exam. Some assessments may be limited, especially if the visit was conducted virtually. If needed, we may recommend a separate in-person follow-up with your provider.  Ongoing Care Seeing your primary care provider every 3 to 6 months helps us  monitor your health and provide consistent, personalized care.   Referrals If a referral was made during today's visit and you haven't received any updates within two weeks, please contact the referred provider directly to check on the status.  Recommended Screenings:  Health Maintenance  Topic Date Due   Zoster (Shingles) Vaccine (1 of 2) Never done   COVID-19 Vaccine (3 - Pfizer risk series) 05/11/2020   Hemoglobin A1C  10/24/2024   Yearly kidney health urinalysis for diabetes  01/04/2025   Yearly kidney function blood test for diabetes  07/25/2025   Complete foot exam   07/25/2025   Medicare Annual Wellness Visit  09/06/2025   Breast Cancer Screening  12/27/2025   DTaP/Tdap/Td vaccine (4 - Td or Tdap) 06/17/2031   Colon Cancer Screening  07/30/2032   Pneumococcal Vaccine for age over 19  Completed   Flu Shot  Completed   Hepatitis C Screening  Completed   HIV Screening  Completed   Hepatitis B Vaccine  Aged Out   HPV Vaccine  Aged Out   Meningitis B Vaccine  Aged Out   Eye exam for diabetics  Discontinued       09/06/2024    1:31 PM  Advanced  Directives  Does Patient Have a Medical Advance Directive? No  Would patient like information on creating a medical advance directive? No - Patient declined   Advance Care Planning is important because it: Ensures you receive medical care that aligns with your values, goals, and preferences. Provides guidance to your family and loved ones, reducing the emotional burden of decision-making during critical moments.  Vision: Annual vision screenings are recommended for early detection of glaucoma, cataracts, and diabetic retinopathy. These exams can also reveal signs of chronic conditions such as diabetes and high blood pressure.  Dental: Annual dental screenings help detect early signs of oral cancer, gum disease, and other conditions linked to overall health, including heart disease and diabetes.  Please see the attached documents for additional preventive care recommendations.

## 2024-09-20 ENCOUNTER — Ambulatory Visit (INDEPENDENT_AMBULATORY_CARE_PROVIDER_SITE_OTHER): Admitting: Podiatry

## 2024-09-20 ENCOUNTER — Encounter: Payer: Self-pay | Admitting: Podiatry

## 2024-09-20 VITALS — Ht <= 58 in | Wt 130.0 lb

## 2024-09-20 DIAGNOSIS — L97411 Non-pressure chronic ulcer of right heel and midfoot limited to breakdown of skin: Secondary | ICD-10-CM | POA: Diagnosis not present

## 2024-09-20 NOTE — Progress Notes (Signed)
 Internal Medicine Attending:  I reviewed the AWV findings of the medical professional who conducted the visit. I was present in the office suite and immediately available to provide assistance and direction throughout the time the service was provided.

## 2024-09-20 NOTE — Progress Notes (Signed)
   Chief Complaint  Patient presents with   Nail Problem    Pt is here for [facility] more concern of callous then the nails.    HPI: 63 y.o. female PMHx Down syndrome, diabetes mellitus presenting today for evaluation of a small wound that the patient's caregiver noticed about 2 days ago to the right heel.  Over the last 2 days it has improved significantly.  She has been applying Aquaphor to the wound.  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  Dermatology: Skin is warm, dry and supple bilateral lower extremities.  Superficial small wound noted to the medial aspect of the right posterior heel with routine healing.  There is no drainage or concern for underlying infection.  It should heal uneventfully.  Please see above noted photo  Vascular: Clinically no concern for vascular compromise  Neurological: Grossly intact via light touch  Musculoskeletal Exam: Nonambulatory in a wheelchair  Assessment/Plan of Care: 1.  Superficial partial-thickness wound right medial posterior heel with routine healing  -Patient evaluated -The superficial wound to the right heel appears to be healing nicely.  Her home health nurse continues routine debridement of her nails which appear healthy and she is tending to the patient's feet very nicely.  Continue -Return to clinic PRN       Thresa EMERSON Sar, DPM [facility]  Dr. Thresa EMERSON Sar, DPM    [address] [place], [place] [address]                Office [phone]  Fax [phone]

## 2024-11-29 ENCOUNTER — Ambulatory Visit

## 2024-11-29 VITALS — BP 103/69 | HR 55 | Temp 97.7°F | Ht <= 58 in | Wt 99.4 lb

## 2024-11-29 DIAGNOSIS — E785 Hyperlipidemia, unspecified: Secondary | ICD-10-CM

## 2024-11-29 DIAGNOSIS — R634 Abnormal weight loss: Secondary | ICD-10-CM | POA: Diagnosis not present

## 2024-11-29 DIAGNOSIS — E1169 Type 2 diabetes mellitus with other specified complication: Secondary | ICD-10-CM | POA: Diagnosis not present

## 2024-11-29 DIAGNOSIS — Q909 Down syndrome, unspecified: Secondary | ICD-10-CM

## 2024-11-29 DIAGNOSIS — Z0000 Encounter for general adult medical examination without abnormal findings: Secondary | ICD-10-CM

## 2024-11-29 DIAGNOSIS — G4720 Circadian rhythm sleep disorder, unspecified type: Secondary | ICD-10-CM

## 2024-11-29 DIAGNOSIS — I10 Essential (primary) hypertension: Secondary | ICD-10-CM | POA: Diagnosis not present

## 2024-11-29 DIAGNOSIS — M1000 Idiopathic gout, unspecified site: Secondary | ICD-10-CM | POA: Diagnosis not present

## 2024-11-29 DIAGNOSIS — G4700 Insomnia, unspecified: Secondary | ICD-10-CM | POA: Diagnosis not present

## 2024-11-29 LAB — POCT GLYCOSYLATED HEMOGLOBIN (HGB A1C): HbA1c, POC (controlled diabetic range): 6 % (ref 0.0–7.0)

## 2024-11-29 LAB — GLUCOSE, CAPILLARY: Glucose-Capillary: 73 mg/dL (ref 70–99)

## 2024-11-29 MED ORDER — RAMELTEON 8 MG PO TABS: 8.0000 mg | ORAL_TABLET | Freq: Every evening | ORAL | 1 refills | Status: AC | PRN

## 2024-11-29 NOTE — Assessment & Plan Note (Addendum)
-   Continue pravastatin  20mg  daily

## 2024-11-29 NOTE — Assessment & Plan Note (Addendum)
 Dependent on family members for most ADLs.  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  Continues to be unable to ambulate short distances at home without assistance. Echocardiogram from October of 2025 showed EF of 65-70% with mild aortic regurgitation. TSH has been within normal limits in the past.  Her Down syndrome and decreased interaction with healthcare workers make screening for Alzheimer's and other forms of dementia difficult.  The patient's family thinks she is experiencing some cognitive decline as well.  I am unsure of the clinical utility of obtaining neurocognitive testing and her would be given her underlying Down syndrome.  After discussion with the family, we decided to delay cognitive testing at this time.

## 2024-11-29 NOTE — Assessment & Plan Note (Addendum)
 The patient lives with her sister and is accompanied by her sisters granddaughter to today's visit.  Her niece provides a lot of her medical care.  She states that the trazodone  has not been working for her at night.  She reports that Ms. Schwalm sleeps a lot during the day and then is not tired at night and stays up most of the night talking about her relatives who have passed on and walking around the house.  We discussed that sometimes in individuals with decreased eyesight and that their circadian rhythm can be altered, with the normal 24-hour cycle being slightly elongated due to lack of visual anchoring with lighting throughout the day.  They state that even when they keep her up during the day she will sometimes be awake all night.  We discussed the need to slowly transition her back to sleeping at night to avoid caregiver burden.  The patient's caregivers wanted to explore options such as antipsychotics and benzodiazepines which I do not believe are good options for this patient with cognitive impairment and increased age.  Patient has tried melatonin in the past without benefit.  We discussed starting the patient on ramelteon  versus a dual Orexin receptor antagonist.  We discussed that a Orexin antagonist may provide benefit both with sleep initiation and sleep maintenance.  We discussed that there may be a cost barrier to this.  After discussion of the options, the patient's caregiver elected to a trial of ramelteon  initially given his lower cost.  We also discussed the plan to gradually shift her sleep-wake cycle with the ramelteon .  We also discussed how the symptoms could be related to the start of cognitive decline and dementia.  -Prescribed ramelteon  8 mg, patient was instructed to take it at 1 PM for 2 days, 3 PM for 2 days, 5 PM for 2 days, 7 PM for 2 days, then 9 PM indefinitely.  Hopefully this will provide a gradual transition back to a normal sleep-wake cycle and be able to provide a anchor for  her circadian rhythm given her near blindness. -Advised patient and caretaker to follow-up in 1 month.  If the ramelteon  has not been beneficial for the patient, would recommend starting a dual Orexin receptor antagonist.  This is not on the Medicaid formulary. Per Merilyn, daridorexant and Lemborexant would be cheaper options than suvorexant.  - Discontinue trazodone  due to lack of benefit

## 2024-11-29 NOTE — Assessment & Plan Note (Addendum)
 She has a history of hypertension. Her current regimen is Amlodipine -Valsartan  5-160mg  daily. She reports that she is adherent to his medications. She denies headaches, chest pain, shortness of breath, peripheral edema, and vision changes. Her BP in the office today is 103/69. Her caregiver reports that she does not get lightheaded or dizzy normally.   -Continue Exforge  5-160 - Deprescribe at next appointment in a month if her blood pressure continues to be low

## 2024-11-29 NOTE — Assessment & Plan Note (Addendum)
 Well-controlled with no medications. Lab Results  Component Value Date   HGBA1C 6.0 11/29/2024   HGBA1C 6.3 07/25/2024   HGBA1C 6.3 (A) 01/05/2024  Previously had been on insulin  therapies and per family report had been over 400 pounds in the past.  She has had significant weight loss since moving to her sister's house 10 years ago.   - Does not need frequent A1c checks.  Next could be done in 6 months to a year

## 2024-11-29 NOTE — Assessment & Plan Note (Addendum)
 Controlled on allopurinol  100 mg daily

## 2024-11-29 NOTE — Progress Notes (Signed)
 "  CC: Routine Follow Up for chronic conditions and insomnia after last office visit 07/25/2024  HPI:  Linda Lynn is a 64 y.o. 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 Please see problem based assessment and plan for further history.  ROS  Medications: Current Outpatient Medications  Medication Instructions   allopurinol  (ZYLOPRIM ) 100 mg, Oral, Daily   amLODipine -valsartan  (EXFORGE ) 5-160 MG tablet 1 tablet, Oral, Daily   Multiple Vitamins-Minerals (VITAMIN D3 COMPLETE PO) Take by mouth.   pravastatin  (PRAVACHOL ) 20 MG tablet TAKE 1 TABLET BY MOUTH EVERY DAY   ramelteon  (ROZEREM ) 8 mg, Oral, At bedtime PRN     Physical Exam:  Vitals:   11/29/24 0910  BP: 103/69  Pulse: (!) 55  Temp: 97.7 F (36.5 C)  TempSrc: Oral  SpO2: 98%  Weight: 99 lb 6.4 oz (45.1 kg)  Height: 4' 8 (1.422 m)    Physical Exam Constitutional:      General: She is not in acute distress. HENT:     Head:     Comments: Features consistent with down syndrome Abrasion on forehead    Mouth/Throat:     Mouth: Mucous membranes are dry.  Cardiovascular:     Rate and Rhythm: Regular rhythm. Bradycardia present.     Heart sounds: Murmur heard.  Pulmonary:     Effort: Pulmonary effort is normal. No respiratory distress.  Abdominal:     General: Bowel sounds are normal. There is no distension.     Palpations: There is no mass.     Tenderness: There is no abdominal tenderness.  Skin:    General: Skin is dry.  Neurological:     Mental Status: She is alert.  Psychiatric:     Comments: Does not respond to most direct questions. Randomly will pace the room and discuss things that the niece reports have happened in the past.     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  Her niece provides a lot of her medical care.  She states that the trazodone  has not been working for her at night.  She reports that [name] sleeps a lot during the day and then is not tired at night and stays up most of the night talking about her relatives who have passed on and walking around the house.  We discussed that sometimes in individuals with decreased eyesight and that their circadian rhythm can be altered, with the normal 24-hour cycle being slightly elongated due to lack of visual anchoring with lighting throughout the day.  They state that even when they keep her up during the day she will sometimes be awake all night.  We discussed the need to slowly transition her back  to sleeping at night to avoid caregiver burden.  The patient's caregivers wanted to explore options such as antipsychotics and benzodiazepines which I do not believe are good options for this patient with cognitive impairment and increased age.  Patient has tried melatonin in the past without benefit.  We discussed starting the patient on ramelteon  versus a dual Orexin receptor antagonist.  We discussed that a Orexin antagonist may provide benefit both with sleep initiation and sleep maintenance.  We discussed that there may be a cost barrier to this.  After discussion of the options, the patient's caregiver elected to a trial of ramelteon  initially given his lower cost.  We also discussed the plan to gradually shift her sleep-wake cycle with the ramelteon .  We also discussed how the  symptoms could be related to the start of cognitive decline and dementia.  -Prescribed ramelteon  8 mg, patient was instructed to take it at 1 PM for 2 days, 3 PM for 2 days, 5 PM for 2 days, 7 PM for 2 days, then 9 PM indefinitely.  Hopefully this will provide a gradual transition back to a normal sleep-wake cycle and be able to provide a anchor for her circadian rhythm given her near blindness. -Advised patient and caretaker to follow-up in 1 month.  If the ramelteon  has not been beneficial for the patient, would recommend starting a dual Orexin receptor antagonist.  This is not on the Medicaid formulary. Per Merilyn, daridorexant and Lemborexant would be cheaper options than suvorexant.  - Discontinue trazodone  due to lack of benefit Weight loss Patient has lost about 10 pounds since her last office visit in September.  The caretaker reports that she is regularly hungry and eats about 3 meals but that can be erratic.  She believes that the weight loss is consistent with the amount of food that she eats.  The patient maintains a healthy weight right now and it is not concerning to the caretakers.  She has had a normal TSH in the past.  - Evaluate weight and next appointment and consider further workup upon consultation with family Type 2 diabetes mellitus with other specified complication, without long-term current use of insulin  (HCC) Well-controlled with no medications. Lab Results  Component Value Date   HGBA1C 6.0 11/29/2024   HGBA1C 6.3 07/25/2024   HGBA1C 6.3 (A) 01/05/2024  Previously had been on insulin  therapies and per family report had been over 400 pounds in the past.  She has had significant weight loss since moving to her sister's house 10 years ago.   - Does not need frequent A1c checks.  Next could be done in 6 months to a year   Essential hypertension She has a history of hypertension. Her current regimen is Amlodipine -Valsartan  5-160mg  daily. She reports that she is adherent  to his medications. She denies headaches, chest pain, shortness of breath, peripheral edema, and vision changes. Her BP in the office today is 103/69. Her caregiver reports that she does not get lightheaded or dizzy normally.   -Continue Exforge  5-160 - Deprescribe at next appointment in a month if her blood pressure continues to be low  Idiopathic gout, unspecified chronicity, unspecified site Controlled on allopurinol  100 mg daily Down's syndrome Dependent on family members for most ADLs.  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  Continues to be unable to ambulate short distances at home without assistance. Echocardiogram from October of 2025 showed EF of 65-70% with mild aortic regurgitation. TSH has been within normal limits in  the past.  Her Down syndrome and decreased interaction with healthcare workers make screening for Alzheimer's and other forms of dementia difficult.  The patient's family thinks she is experiencing some cognitive decline as well.  I am unsure of the clinical utility of obtaining neurocognitive testing and her would be given her underlying Down syndrome.  After discussion with the family, we decided to delay cognitive testing at this time.  Hyperlipidemia associated with type 2 diabetes mellitus (HCC) Continue pravastatin  20 mg daily    Replacement deemed unsafe Orders Placed This Encounter  Procedures   Microalbumin / Creatinine Urine Ratio   Glucose, capillary   POC Hbg A1C    Patient discussed with Dr. Layman Jeanelle Melvenia Napoleon, MD [facility] Internal Medicine Resident PGY-1 Clinic Phone: [phone] Please contact the on call pager at [phone] for any urgent or emergent needs.  "

## 2024-11-29 NOTE — Addendum Note (Signed)
 Addended by: NAPOLEON MELVENIA HERO on: 11/29/2024 03:53 PM   Modules accepted: Orders

## 2024-11-29 NOTE — Patient Instructions (Signed)
 Thank you, Ms. Jon Elbe, for allowing us  to provide your care today. Today we discussed . . .  > Insomnia       - We discussed sleep difficulties at today's visit.  It seems like Aurie has difficulty in coordinating proper sleep times.  Since she is most sleepy during the day, we will try to start shifting her sleep schedule back to the night.  Take the ramelteon  at 1 PM for 2 days, 3 PM for 2 days, 5 PM for 2 days, 7 PM for 2 days, then continue to take the ramelteon  at 9 PM each day.  It is important to take this medication every day and not just when you feel like she needs it.  Try to keep her awake until the timing of the ramelteon .  This should help her to coordinate her body's circadian rhythm with the times that she actually needs to be asleep > Diabetes       - Her A1c is well-controlled today with no medications > Blood pressure       - Continue to take your blood pressure medications, if this remains low at the next visit we may consider stopping some of your medications.   I have ordered the following labs for you:   Lab Orders         Microalbumin / Creatinine Urine Ratio         Glucose, capillary         POC Hbg A1C       Referrals ordered today:   Referral Orders  No referral(s) requested today      Follow up: 1 month    Remember:  Should you have any questions or concerns please call the internal medicine clinic at [phone].     Melvenia Morrison, [facility]

## 2024-11-29 NOTE — Assessment & Plan Note (Signed)
 Patient has lost about 10 pounds since her last office visit in September.  The caretaker reports that she is regularly hungry and eats about 3 meals but that can be erratic.  She believes that the weight loss is consistent with the amount of food that she eats.  The patient maintains a healthy weight right now and it is not concerning to the caretakers.  She has had a normal TSH in the past.  - Evaluate weight and next appointment and consider further workup upon consultation with family

## 2024-11-30 LAB — MICROALBUMIN / CREATININE URINE RATIO
Creatinine, Urine: 163.1 mg/dL
Microalb/Creat Ratio: 60 mg/g{creat} — ABNORMAL HIGH (ref 0–29)
Microalbumin, Urine: 98.3 ug/mL

## 2024-11-30 NOTE — Progress Notes (Signed)
 Internal Medicine Clinic Attending  Case discussed with the resident at the time of the visit.  We reviewed the resident's history and exam and pertinent patient test results.  I agree with the assessment, diagnosis, and plan of care documented in the resident's note.

## 2024-12-01 ENCOUNTER — Ambulatory Visit: Payer: Self-pay

## 2024-12-21 ENCOUNTER — Emergency Department (HOSPITAL_COMMUNITY): Admitting: Anesthesiology

## 2024-12-21 ENCOUNTER — Emergency Department (HOSPITAL_COMMUNITY)

## 2024-12-21 ENCOUNTER — Encounter (HOSPITAL_COMMUNITY): Payer: Self-pay | Admitting: Emergency Medicine

## 2024-12-21 ENCOUNTER — Observation Stay (HOSPITAL_COMMUNITY)
Admission: EM | Admit: 2024-12-21 | Discharge: 2024-12-22 | Disposition: A | Source: Home / Self Care | Attending: Emergency Medicine | Admitting: Emergency Medicine

## 2024-12-21 ENCOUNTER — Encounter (HOSPITAL_COMMUNITY): Admission: EM | Disposition: A | Payer: Self-pay | Source: Home / Self Care | Attending: Emergency Medicine

## 2024-12-21 ENCOUNTER — Ambulatory Visit: Payer: Self-pay | Admitting: Otolaryngology

## 2024-12-21 ENCOUNTER — Ambulatory Visit: Admitting: Podiatry

## 2024-12-21 ENCOUNTER — Other Ambulatory Visit: Payer: Self-pay

## 2024-12-21 DIAGNOSIS — T17208A Unspecified foreign body in pharynx causing other injury, initial encounter: Principal | ICD-10-CM | POA: Diagnosis present

## 2024-12-21 DIAGNOSIS — T18108A Unspecified foreign body in esophagus causing other injury, initial encounter: Secondary | ICD-10-CM | POA: Insufficient documentation

## 2024-12-21 LAB — GLUCOSE, CAPILLARY: Glucose-Capillary: 119 mg/dL — ABNORMAL HIGH (ref 70–99)

## 2024-12-21 MED ORDER — OXYCODONE HCL 5 MG/5ML PO SOLN: 5.0000 mg | Freq: Once | ORAL | Status: DC | PRN

## 2024-12-21 MED ORDER — OXYCODONE HCL 5 MG PO TABS: 5.0000 mg | ORAL_TABLET | Freq: Once | ORAL | Status: DC | PRN

## 2024-12-21 MED ORDER — SUCCINYLCHOLINE CHLORIDE 200 MG/10ML IV SOSY
PREFILLED_SYRINGE | INTRAVENOUS | Status: DC | PRN
  Administered 2024-12-21: 140 mg via INTRAVENOUS

## 2024-12-21 MED ORDER — MIDAZOLAM HCL (PF) 2 MG/2ML IJ SOLN
INTRAMUSCULAR | Status: DC | PRN
  Administered 2024-12-21: 1 mg via INTRAVENOUS

## 2024-12-21 MED ORDER — PROPOFOL 10 MG/ML IV BOLUS
INTRAVENOUS | Status: AC
  Filled 2024-12-21: qty 20

## 2024-12-21 MED ORDER — SUCCINYLCHOLINE CHLORIDE 200 MG/10ML IV SOSY
PREFILLED_SYRINGE | INTRAVENOUS | Status: AC
  Filled 2024-12-21: qty 10

## 2024-12-21 MED ORDER — MEPERIDINE HCL 25 MG/ML IJ SOLN: 6.2500 mg | INTRAMUSCULAR | Status: DC | PRN

## 2024-12-21 MED ORDER — ONDANSETRON HCL 4 MG/2ML IJ SOLN: 4.0000 mg | Freq: Once | INTRAMUSCULAR | Status: DC | PRN

## 2024-12-21 MED ORDER — PHENYLEPHRINE 80 MCG/ML (10ML) SYRINGE FOR IV PUSH (FOR BLOOD PRESSURE SUPPORT)
PREFILLED_SYRINGE | INTRAVENOUS | Status: DC | PRN
  Administered 2024-12-21: 80 ug via INTRAVENOUS

## 2024-12-21 MED ORDER — PROPOFOL 10 MG/ML IV BOLUS
INTRAVENOUS | Status: DC | PRN
  Administered 2024-12-21: 100 mg via INTRAVENOUS

## 2024-12-21 MED ORDER — DEXMEDETOMIDINE HCL IN NACL 80 MCG/20ML IV SOLN
INTRAVENOUS | Status: AC
  Filled 2024-12-21: qty 20

## 2024-12-21 MED ORDER — LACTATED RINGERS IV SOLN: INTRAVENOUS | Status: DC | PRN

## 2024-12-21 MED ORDER — MIDAZOLAM HCL 2 MG/2ML IJ SOLN
INTRAMUSCULAR | Status: AC
  Filled 2024-12-21: qty 2

## 2024-12-21 MED ORDER — FENTANYL CITRATE (PF) 50 MCG/ML IJ SOSY: 25.0000 ug | PREFILLED_SYRINGE | INTRAMUSCULAR | Status: DC | PRN

## 2024-12-21 MED ORDER — LIDOCAINE HCL (CARDIAC) PF 100 MG/5ML IV SOSY
PREFILLED_SYRINGE | INTRAVENOUS | Status: DC | PRN
  Administered 2024-12-21: 100 mg via INTRAVENOUS

## 2024-12-21 NOTE — Anesthesia Procedure Notes (Signed)
 Procedure Name: Intubation Date/Time: 12/21/2024 10:38 PM  Performed by: Therisa Doyal CROME, CRNAPre-anesthesia Checklist: Patient identified, Emergency Drugs available, Suction available and Patient being monitored Patient Re-evaluated:Patient Re-evaluated prior to induction Oxygen Delivery Method: Circle system utilized Preoxygenation: Pre-oxygenation with 100% oxygen Induction Type: IV induction and Rapid sequence Laryngoscope Size: Glidescope and 3 Grade View: Grade I Tube type: Parker flex tip Tube size: 7.0 mm Number of attempts: 1 Airway Equipment and Method: Oral airway, Rigid stylet and Video-laryngoscopy Placement Confirmation: ETT inserted through vocal cords under direct vision, positive ETCO2 and breath sounds checked- equal and bilateral Secured at: 22 cm Tube secured with: Tape Dental Injury: Teeth and Oropharynx as per pre-operative assessment  Comments: Glidescope used by Dr. Jesus

## 2024-12-21 NOTE — ED Notes (Addendum)
 Dr. Ellouise informed nurse that patient was going to transferred to [facility] and [facility] called, this nurse gave report to Brittany, press photographer at [employer]. JRPRN

## 2024-12-21 NOTE — ED Notes (Addendum)
 Transfer to [facility] on hold. JRPRN

## 2024-12-21 NOTE — Anesthesia Preprocedure Evaluation (Signed)
"                                    Anesthesia Evaluation  Patient identified by MRN, date of birth, ID band Patient awake    Reviewed: Allergy & Precautions, H&P , NPO status , Patient's Chart, lab work & pertinent test results  Airway Mallampati: II  TM Distance: >3 FB Neck ROM: Full  Mouth opening: Limited Mouth Opening  Dental no notable dental hx. (+) Poor Dentition, Missing, Loose   Pulmonary neg pulmonary ROS   Pulmonary exam normal breath sounds clear to auscultation       Cardiovascular Exercise Tolerance: Good hypertension, Pt. on medications negative cardio ROS Normal cardiovascular exam+ Valvular Problems/Murmurs  Rhythm:Regular Rate:Normal     Neuro/Psych negative neurological ROS  negative psych ROS   GI/Hepatic negative GI ROS, Neg liver ROS,,,  Endo/Other  negative endocrine ROSdiabetes, Well Controlled    Renal/GU negative Renal ROS  negative genitourinary   Musculoskeletal negative musculoskeletal ROS (+)    Abdominal   Peds negative pediatric ROS (+)  Hematology negative hematology ROS (+)   Anesthesia Other Findings Blindness Bradycardia Down's syndrome Esophageal foreign body Essential hypertension Gout Healthcare maintenance Hyperlipidemia associated with type 2 diabetes mellitus (HCC) Impairment of mobility associated with impaired vision Insomnia Systolic murmur Type 2 diabetes mellitus with other specified complication (HCC) Weight loss    Reproductive/Obstetrics negative OB ROS                              Anesthesia Physical Anesthesia Plan  ASA: 3 and emergent  Anesthesia Plan: General   Post-op Pain Management: Minimal or no pain anticipated   Induction: Intravenous  PONV Risk Score and Plan: 3 and Treatment may vary due to age or medical condition  Airway Management Planned: Oral ETT and Video Laryngoscope Planned  Additional Equipment: None  Intra-op Plan:    Post-operative Plan:   Informed Consent: I have reviewed the patients History and Physical, chart, labs and discussed the procedure including the risks, benefits and alternatives for the proposed anesthesia with the patient or authorized representative who has indicated his/her understanding and acceptance.       Plan Discussed with: Anesthesiologist and CRNA  Anesthesia Plan Comments:          Anesthesia Quick Evaluation  "

## 2024-12-21 NOTE — ED Triage Notes (Signed)
 Patient presents via EMS due to choked on unknown item. EMS reports spitting and drooling, but is unclear if drooling is her baseline. She is cognitively at baseline. No distress at this time. EMS reports rhonchi.    EMS  138/62 BP 60 HR 100% SPO2 on room air

## 2024-12-21 NOTE — ED Notes (Signed)
 Family states drooling is abnormal for the patient .

## 2024-12-21 NOTE — Transfer of Care (Signed)
 Immediate Anesthesia Transfer of Care Note  Patient: Linda Lynn  Procedure(s) Performed: REMOVAL, FOREIGN BODY (Throat)  Patient Location: PACU  Anesthesia Type:General  Level of Consciousness: drowsy  Airway & Oxygen Therapy: Patient Spontanous Breathing and Patient connected to face mask oxygen  Post-op Assessment: Report given to RN and Post -op Vital signs reviewed and stable  Post vital signs: Reviewed and stable  Last Vitals:  Vitals Value Taken Time  BP 86/59 12/21/24 23:00  Temp 36.5 C 12/21/24 23:00  Pulse 53 12/21/24 23:09  Resp 7 12/21/24 23:09  SpO2 100 % 12/21/24 23:09  Vitals shown include unfiled device data.  Last Pain:  Vitals:   12/21/24 1911  TempSrc: Oral         Complications: No notable events documented.

## 2024-12-21 NOTE — ED Provider Notes (Addendum)
 "  EMERGENCY DEPARTMENT AT [facility] Provider Note   CSN: [id number] Arrival date & time: 12/21/24  1526     Patient presents with: Choking   Linda Lynn is a 64 y.o. female.   Patient is a 64 year old female with a past medical history of Down syndrome and intellectual delay, nonverbal at baseline, diabetes and hypertension presenting to the emergency department after an episode of choking with concern for food bolus.  Patient is here with her family who states that they were eating just prior to arrival when she had an episode of choking.  She states that she has had to do the Heimlich maneuver on her in the past and has been able to relieve the obstruction but was unable to relieve it today.  She states that she looked like she tried to vomit once.  She states that she has been drooling since.  States that she has not tried to eat or drink anything since.  The history is provided by a relative. The history is limited by a developmental delay.       Prior to Admission medications  Medication Sig Start Date End Date Taking? Authorizing Provider  allopurinol  (ZYLOPRIM ) 100 MG tablet Take 1 tablet (100 mg total) by mouth daily. 07/25/24   Elnora Ip, MD  amLODipine -valsartan  (EXFORGE ) 5-160 MG tablet Take 1 tablet by mouth daily. 07/25/24   Elnora Ip, MD  Multiple Vitamins-Minerals (VITAMIN D3 COMPLETE PO) Take by mouth.    [provider]  pravastatin  (PRAVACHOL ) 20 MG tablet TAKE 1 TABLET BY MOUTH EVERY DAY 10/19/23   Jerrell Cleatus Ned, MD  ramelteon  (ROZEREM ) 8 MG tablet Take 1 tablet (8 mg total) by mouth at bedtime as needed for sleep. 11/29/24 11/29/25  Napoleon Limes, MD    Allergies: Patient has no known allergies.    Review of Systems  Updated Vital Signs BP 125/86   Pulse 63   Temp 98 F (36.7 C) (Oral)   Resp 16   LMP 03/02/2008   SpO2 99%   Physical Exam Vitals and nursing note reviewed.  Constitutional:       General: She is not in acute distress. HENT:     Head: Normocephalic and atraumatic.     Nose: Nose normal.     Mouth/Throat:     Mouth: Mucous membranes are moist.     Comments: Drooling, no obvious foreign body in posterior oropharynx though exam somewhat limited due to patient participation  Eyes:     Extraocular Movements: Extraocular movements intact.     Comments: Absent L eye  Cardiovascular:     Rate and Rhythm: Normal rate and regular rhythm.     Heart sounds: Normal heart sounds.  Pulmonary:     Effort: Pulmonary effort is normal. No respiratory distress.     Breath sounds: Normal breath sounds. No stridor. No wheezing.  Abdominal:     General: Abdomen is flat.     Palpations: Abdomen is soft.  Musculoskeletal:        General: Normal range of motion.     Cervical back: Normal range of motion and neck supple.  Skin:    General: Skin is warm and dry.  Neurological:     General: No focal deficit present.     Mental Status: She is alert. Mental status is at baseline.     (all labs ordered are listed, but only abnormal results are displayed) Labs Reviewed - No data to display  EKG: None  Radiology: DG Chest 2 View Result Date: 12/21/2024 EXAM: 2 VIEW(S) XRAY OF THE CHEST 12/21/2024 05:28:38 PM COMPARISON: 05/17/2013. CLINICAL HISTORY: Possible choking; evaluation for foreign body/aspiration. FINDINGS: LUNGS AND PLEURA: Low lung volumes. No focal pulmonary opacity. No pleural effusion. No pneumothorax. HEART AND MEDIASTINUM: No acute abnormality of the cardiac and mediastinal silhouettes. BONES AND SOFT TISSUES: No acute osseous abnormality. Thoracic spondylosis. IMPRESSION: 1. No acute cardiopulmonary abnormality. Electronically signed by: Pinkie Pebbles MD 12/21/2024 05:39 PM EST RP Workstation: [id number]   CT Soft Tissue Neck Wo Contrast Result Date: 12/21/2024 EXAM: CT NECK WITHOUT CONTRAST 12/21/2024 05:14:26 PM TECHNIQUE: CT of the neck was performed without  the administration of intravenous contrast. Multiplanar reformatted images are provided for review. Automated exposure control, iterative reconstruction, and/or weight based adjustment of the mA/kV was utilized to reduce the radiation dose to as low as reasonably achievable. COMPARISON: None available. CLINICAL HISTORY: possible choking, eval for foreign body Possible choking; evaluation for foreign body. FINDINGS: AERODIGESTIVE TRACT: There is a linear radiodense foreign body within the oropharynx and hypopharynx which measures approximately 4.1 cm in length (series 7, image 54). The foreign body extends inferiorly from the left lateral aspect of the oropharynx, abuts the posterior inferior aspect of the left aryepiglottic fold, and extends into the hypopharynx. A portion of the long linear foreign body may be embedded within the left lateral aspect of the oropharyngeal wall (series 3, image 44). There is an additional 1.0 x 0.9 cm dense foreign body along the right aspect of the hypopharynx just above the level of the cervical esophagus (series 3, image 62). There are secretions noted within the hypopharynx and an amount of secretions noted within the right piriform sinus. The nasopharynx is symmetric. Symmetric appearance of the tonsils. The oropharyngeal airway is patent. The oral cavity and floor of mouth are unremarkable. Normal appearance of the epiglottis. The vocal folds are symmetric. No edema. SALIVARY GLANDS: The parotid and submandibular glands are unremarkable. THYROID : Unremarkable. LYMPH NODES: No suspicious cervical lymphadenopathy. SOFT TISSUES: No mass or fluid collection. BONES: Degenerative changes noted in the visualized portions of the spine. Prominent degenerative endplate osteophytes in the cervical spine. Degenerative changes at the right acromioclavicular joint and at the bilateral sternoclavicular joints. Dental caries. VASCULATURE: Atherosclerosis noted at the bilateral carotid  bifurcations. There is otherwise limited evaluation of the vasculature on this noncontrast exam. OTHER: Mucosal thickening in the right maxillary sinus. Left ocular prosthesis. Visualized mastoid air cells are well aerated. Visualized lungs are clear. IMPRESSION: 1. Linear radiodense foreign body within the oropharynx and hypopharynx measuring approximately 4.1 cm in length. The superior portion abuts and is possibly embedded within the left lateral oropharyngeal wall. 2. Additional 1.0 x 0.9 cm dense foreign body along the right aspect of the hypopharynx just above the level of the cervical esophagus. 3. Secretions within the hypopharynx and right piriform sinus. 4. Findings discussed with Dr. Ellouise at 5:35 PM on 12/21/24. Electronically signed by: Donnice Mania MD 12/21/2024 05:37 PM EST RP Workstation: [id number]     Procedures   Medications Ordered in the ED - No data to display  Clinical Course as of 12/21/24 2208  Thu Dec 21, 2024  1734 I spoke with radiology, the patient has 4 cm foreign body, in posterior oropharynx to hypopharynx, may be partially embedded in the oropharyngeal wall and a second foreign body in hypopharynx. Nothing distal to the glottis.  [VK]  8042 I spoke with Teoh with ENT who recommended ED to ED transfer  to [facility] for OR.  [VK]  2020 Patient accepted by Dr. Emil for transfer.  [VK]  2043 I received a call back from Dr. Karis - patient follows with [facility]. I spoke with Dr. Jesus with [facility] who will review the images and call back with recommendations.  [VK]  2208 Dr. Jesus plans to take the patient to OR for foreign body removal.  [VK]    Clinical Course User Index [VK] Kingsley, Art Levan K, DO                                 Medical Decision Making This patient presents to the ED with chief complaint(s) of episode of choking with pertinent past medical history of down syndrome with intellectual delay and non-verbal at baseline, HTN, DM which further complicates  the presenting complaint. The complaint involves an extensive differential diagnosis and also carries with it a high risk of complications and morbidity.    The differential diagnosis includes foreign body, food bolus, esophagitis, aspiration   Additional history obtained: Additional history obtained from family Records reviewed Primary Care Documents  ED Course and Reassessment: On patient's arrival she is hemodynamically stable in no acute distress.  She is drooling on exam but has no signs of respiratory distress or airway compromise.  Her exam is limited due to her participation but no obvious upper airway foreign body is seen.  Will of x-ray and CT neck to evaluate for foreign body or food bolus as exam and history is limited with her intellectual delay.  Independent labs interpretation:  N/A  Independent visualization of imaging: - I independently visualized the following imaging with scope of interpretation limited to determining acute life threatening conditions related to emergency care: CT neck, CXR, which revealed foreign body in oropharynx, hypopharynx  Consultation: - Consulted or discussed management/test interpretation w/ external professional: ENT  Consideration for admission or further workup: admission to OR for foreign body removal  Social Determinants of health: N/A    Amount and/or Complexity of Data Reviewed Radiology: ordered.  Risk Decision regarding hospitalization.       Final diagnoses:  Foreign body in oropharynx    ED Discharge Orders     None          Kingsley, Zhaire Locker K, DO 12/21/24 2021    Kingsley, Ivy Puryear K, DO 12/21/24 2208  "

## 2024-12-21 NOTE — Op Note (Addendum)
 OPERATIVE REPORT  DATE OF SURGERY: 12/21/2024  PATIENT:  Linda Lynn,  64 y.o. female  PRE-OPERATIVE DIAGNOSIS:  RETAINED FOREIGN BODY, hypopharynx and esophagus  POST-OPERATIVE DIAGNOSIS:  RETAINED FOREIGN BODY, hypopharynx and esophagus  PROCEDURE:  Procedures: Exam under anesthesia with REMOVAL, FOREIGN BODY  SURGEON:  Ida VEAR Loader, MD  ASSISTANTS: None  ANESTHESIA:   General   EBL: 0 ml  DRAINS: None  LOCAL MEDICATIONS USED:  None  SPECIMEN:  none  COUNTS:  Correct  PROCEDURE DETAILS: The patient was taken to the operating room and placed on the operating table in the supine position. Following induction of general endotracheal anesthesia, the glide laryngoscope was used to evaluate the hypopharynx and the larynx.  A large piece of chicken including meat and bone measuring approximately 7 cm x 3 cm was removed from the esophageal introitus.  Additional pieces were removed from the hypopharynx.  There were no other foreign body seen.  The larynx was clear.  Patient was awakened extubated and transferred to recovery in stable condition.    PATIENT DISPOSITION:  To PACU, stable

## 2024-12-21 NOTE — Consult Note (Signed)
 Reason for Consult: Esophageal foreign body Referring Physician: Ellouise Richerd POUR, DO  Linda Lynn is an 64 y.o. female.  HPI: 64 year old lady, nonverbal, Down syndrome, was eating chicken wings earlier and then at some point stopped eating and was starting to drool.  She was brought here by her niece to the emergency department and CT of the neck revealed what appears to be a bony foreign body in the esophageal introitus.  She is having no airway distress.  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 She has never used smokeless tobacco. She reports that she does not drink alcohol and does not use drugs.  Allergies: Allergies[1]  Medications: Reviewed  No results found for this or any previous visit (from the past 48 hours).  DG Chest 2 View Result Date: 12/21/2024 EXAM: 2 VIEW(S) XRAY OF THE CHEST 12/21/2024 05:28:38 PM COMPARISON: 05/17/2013. CLINICAL HISTORY: Possible choking; evaluation for foreign body/aspiration. FINDINGS: LUNGS AND PLEURA: Low lung volumes. No focal pulmonary opacity. No pleural effusion. No pneumothorax. HEART AND MEDIASTINUM: No acute abnormality of the cardiac and mediastinal silhouettes. BONES AND SOFT TISSUES: No acute osseous abnormality. Thoracic spondylosis. IMPRESSION: 1. No acute cardiopulmonary abnormality. Electronically signed by: Pinkie Pebbles MD 12/21/2024 05:39 PM  EST RP Workstation: [id number]   CT Soft Tissue Neck Wo Contrast Result Date: 12/21/2024 EXAM: CT NECK WITHOUT CONTRAST 12/21/2024 05:14:26 PM TECHNIQUE: CT of the neck was performed without the administration of intravenous contrast. Multiplanar reformatted images are provided for review. Automated exposure control, iterative reconstruction, and/or weight based adjustment of the mA/kV was utilized to reduce the radiation dose to as low as reasonably achievable. COMPARISON: None available. CLINICAL HISTORY: possible choking, eval for foreign body Possible choking; evaluation for foreign body. FINDINGS: AERODIGESTIVE TRACT: There is a linear radiodense foreign body within the oropharynx and hypopharynx which measures approximately 4.1 cm in length (series 7, image 54). The foreign body extends inferiorly from the left lateral aspect of the oropharynx, abuts the posterior inferior aspect of the left aryepiglottic fold, and extends into the hypopharynx. A portion of the long linear foreign body may be embedded within the left lateral aspect of the oropharyngeal wall (series 3, image 44). There is an additional 1.0 x 0.9 cm dense foreign body along the right aspect of the hypopharynx just above the level of the cervical esophagus (series 3, image 62). There are secretions noted within the hypopharynx and an amount of secretions noted within the right piriform sinus. The nasopharynx is symmetric. Symmetric appearance of the tonsils. The oropharyngeal airway is patent. The oral cavity and floor of mouth are unremarkable. Normal appearance of the epiglottis. The vocal folds are symmetric. No edema. SALIVARY GLANDS: The  parotid and submandibular glands are unremarkable. THYROID : Unremarkable. LYMPH NODES: No suspicious cervical lymphadenopathy. SOFT TISSUES: No mass or fluid collection. BONES: Degenerative changes noted in the visualized portions of the spine. Prominent degenerative endplate osteophytes in the cervical  spine. Degenerative changes at the right acromioclavicular joint and at the bilateral sternoclavicular joints. Dental caries. VASCULATURE: Atherosclerosis noted at the bilateral carotid bifurcations. There is otherwise limited evaluation of the vasculature on this noncontrast exam. OTHER: Mucosal thickening in the right maxillary sinus. Left ocular prosthesis. Visualized mastoid air cells are well aerated. Visualized lungs are clear. IMPRESSION: 1. Linear radiodense foreign body within the oropharynx and hypopharynx measuring approximately 4.1 cm in length. The superior portion abuts and is possibly embedded within the left lateral oropharyngeal wall. 2. Additional 1.0 x 0.9 cm dense foreign body along the right aspect of the hypopharynx just above the level of the cervical esophagus. 3. Secretions within the hypopharynx and right piriform sinus. 4. Findings discussed with Dr. Ellouise at 5:35 PM on 12/21/24. Electronically signed by: Donnice Mania MD 12/21/2024 05:37 PM EST RP Workstation: [id number]    MND:Wzhjupcz except as listed in admit H&P  Blood pressure 125/86, pulse 63, temperature 98 F (36.7 C), temperature source Oral, resp. rate 16, last menstrual period 03/02/2008, SpO2 99%.  PHYSICAL EXAM: Overall appearance: No respiratory distress, very difficult to examine.  She is nonverbal. Head: Syndromic appearance. Ears: External ears are clear. Nose: External nose is healthy in appearance. Internal nasal exam free of any lesions or obstruction. Oral Cavity/Pharynx:  There are no mucosal lesions or masses identified. Larynx/Hypopharynx: Flexible fiberoptic laryngoscopy was performed following topical application of Afrin/lidocaine  spray to the nasal cavities.  Airway is clear.  The larynx appears normal.  There is what appears to be a the top of a bony foreign body in the esophageal introitus.  Is just barely projecting out of the esophagus. Neuro: Broad neurologic deficits. Neck: No palpable  neck masses.  Studies Reviewed: CT neck reviewed.  Procedures: Flexible fiberoptic laryngoscopy performed as described above.   Assessment/Plan: Upper esophageal foreign body.  Recommend evaluation under anesthesia in the operating room with retrieval of foreign body.  Risks and benefits were discussed with the niece who understands and agrees to surgery.  U81.891J   Medical Decision Making: #/Complex Problems: 3  Data Reviewed:3  Management:3 (1-Straightforward, 2-Low, 3-Moderate, 4-High)   Ida Loader 12/21/2024, 9:53 PM         [1] No Known Allergies

## 2024-12-22 ENCOUNTER — Encounter (HOSPITAL_COMMUNITY): Payer: Self-pay | Admitting: Otolaryngology

## 2024-12-22 LAB — GLUCOSE, CAPILLARY: Glucose-Capillary: 117 mg/dL — ABNORMAL HIGH (ref 70–99)

## 2024-12-22 MED ORDER — AMLODIPINE BESYLATE-VALSARTAN 5-160 MG PO TABS: 1.0000 | ORAL_TABLET | Freq: Every day | ORAL | Status: DC

## 2024-12-22 MED ORDER — IRBESARTAN 150 MG PO TABS
150.0000 mg | ORAL_TABLET | Freq: Every day | ORAL | Status: DC
  Administered 2024-12-22: 150 mg via ORAL
  Filled 2024-12-22: qty 1

## 2024-12-22 MED ORDER — DEXTROSE-SODIUM CHLORIDE 5-0.9 % IV SOLN: INTRAVENOUS | Status: DC

## 2024-12-22 MED ORDER — AMLODIPINE BESYLATE 5 MG PO TABS
5.0000 mg | ORAL_TABLET | Freq: Every day | ORAL | Status: DC
  Administered 2024-12-22: 5 mg via ORAL
  Filled 2024-12-22: qty 1

## 2024-12-22 MED ORDER — ALLOPURINOL 100 MG PO TABS
100.0000 mg | ORAL_TABLET | Freq: Every day | ORAL | Status: DC
  Administered 2024-12-22: 100 mg via ORAL
  Filled 2024-12-22: qty 1

## 2024-12-22 MED ORDER — PRAVASTATIN SODIUM 20 MG PO TABS
20.0000 mg | ORAL_TABLET | Freq: Every day | ORAL | Status: DC
  Administered 2024-12-22: 20 mg via ORAL
  Filled 2024-12-22: qty 1

## 2024-12-22 NOTE — TOC Transition Note (Signed)
 Transition of Care [facility]) - Discharge Note   Patient Details  Name: Linda Lynn MRN: [id number] Date of Birth: 1961-07-14  [facility]) CM/SW Contact:  Alfonse JONELLE Rex, RN Phone Number: 12/22/2024, 11:38 AM   Clinical Narrative:   DC to home. No INPT needs           Patient Goals and CMS Choice            Discharge Placement                       Discharge Plan and Services Additional resources added to the After Visit Summary for                                       Social Drivers of Health (SDOH) Interventions SDOH Screenings   Food Insecurity: No Food Insecurity (12/22/2024)  Housing: Low Risk (12/22/2024)  Transportation Needs: Patient Unable To Answer (12/22/2024)  Utilities: Not At Risk (12/22/2024)  Alcohol Screen: Low Risk (09/06/2024)  Depression (PHQ2-9): Low Risk (11/29/2024)  Financial Resource Strain: Low Risk (11/29/2024)  Physical Activity: Insufficiently Active (11/29/2024)  Social Connections: Socially Isolated (11/29/2024)  Stress: Stress Concern Present (11/29/2024)  Tobacco Use: Low Risk (12/21/2024)  Health Literacy: Inadequate Health Literacy (09/06/2024)     Readmission Risk Interventions     No data to display

## 2024-12-22 NOTE — Progress Notes (Signed)
Pt was discharged home today. Instructions were reviewed with patient, and questions were answered. Pt was taken to main entrance via wheelchair by NT.  

## 2024-12-22 NOTE — Plan of Care (Signed)
   Problem: Education: Goal: Knowledge of the prescribed therapeutic regimen will improve Outcome: Progressing

## 2024-12-22 NOTE — Plan of Care (Signed)
" °  Problem: Education: Goal: Knowledge of the prescribed therapeutic regimen will improve Outcome: Adequate for Discharge   Problem: Bowel/Gastric: Goal: Gastrointestinal status for postoperative course will improve Outcome: Adequate for Discharge   Problem: Cardiac: Goal: Ability to maintain an adequate cardiac output Outcome: Adequate for Discharge Goal: Will show no evidence of cardiac arrhythmias Outcome: Adequate for Discharge   Problem: Nutritional: Goal: Will attain and maintain optimal nutritional status Outcome: Adequate for Discharge   "

## 2024-12-22 NOTE — Progress Notes (Addendum)
 Patient arrived from PACU via stretcher. She was transferred from stretcher to bed. Patient is calm and seems comfortable without any distress. I was unable to complete admission questions since patient is non-verbal. No family at bedside.

## 2025-01-12 ENCOUNTER — Ambulatory Visit

## 2025-01-24 ENCOUNTER — Ambulatory Visit: Payer: Self-pay | Admitting: Student
